# Patient Record
Sex: Female | Born: 1950 | Race: Black or African American | Hispanic: No | Marital: Married | Smoking: Never smoker
Health system: Southern US, Community
[De-identification: ages and names within clinical notes are randomized; demographics above are authoritative.]

## PROBLEM LIST (undated history)

## (undated) DIAGNOSIS — H269 Unspecified cataract: Secondary | ICD-10-CM

## (undated) DIAGNOSIS — I10 Essential (primary) hypertension: Secondary | ICD-10-CM

## (undated) HISTORY — DX: Unspecified cataract: H26.9

## (undated) HISTORY — PX: CHOLECYSTECTOMY: SHX55

---

## 1977-04-11 HISTORY — PX: TUBAL LIGATION: SHX77

## 1980-04-11 HISTORY — PX: ABDOMINAL HYSTERECTOMY: SHX81

## 2002-06-24 ENCOUNTER — Emergency Department (HOSPITAL_COMMUNITY): Admission: EM | Admit: 2002-06-24 | Discharge: 2002-06-24 | Payer: Self-pay | Admitting: Emergency Medicine

## 2002-10-29 ENCOUNTER — Ambulatory Visit (HOSPITAL_COMMUNITY): Admission: RE | Admit: 2002-10-29 | Discharge: 2002-10-29 | Payer: Self-pay | Admitting: Family Medicine

## 2004-12-15 ENCOUNTER — Emergency Department (HOSPITAL_COMMUNITY): Admission: EM | Admit: 2004-12-15 | Discharge: 2004-12-15 | Payer: Self-pay | Admitting: Emergency Medicine

## 2004-12-28 ENCOUNTER — Ambulatory Visit: Payer: Self-pay | Admitting: Family Medicine

## 2004-12-31 ENCOUNTER — Ambulatory Visit (HOSPITAL_COMMUNITY): Admission: RE | Admit: 2004-12-31 | Discharge: 2004-12-31 | Payer: Self-pay | Admitting: Family Medicine

## 2005-02-09 ENCOUNTER — Ambulatory Visit: Payer: Self-pay | Admitting: Family Medicine

## 2005-02-22 ENCOUNTER — Ambulatory Visit (HOSPITAL_COMMUNITY): Admission: RE | Admit: 2005-02-22 | Discharge: 2005-02-22 | Payer: Self-pay | Admitting: General Surgery

## 2008-03-05 ENCOUNTER — Ambulatory Visit (HOSPITAL_COMMUNITY): Admission: RE | Admit: 2008-03-05 | Discharge: 2008-03-05 | Payer: Self-pay | Admitting: Family Medicine

## 2009-02-12 ENCOUNTER — Encounter: Payer: Self-pay | Admitting: Family Medicine

## 2009-04-14 ENCOUNTER — Emergency Department (HOSPITAL_COMMUNITY): Admission: EM | Admit: 2009-04-14 | Discharge: 2009-04-14 | Payer: Self-pay | Admitting: Emergency Medicine

## 2009-09-28 ENCOUNTER — Ambulatory Visit (HOSPITAL_COMMUNITY): Admission: RE | Admit: 2009-09-28 | Discharge: 2009-09-28 | Payer: Self-pay | Admitting: Family Medicine

## 2010-02-13 ENCOUNTER — Emergency Department (HOSPITAL_COMMUNITY): Admission: EM | Admit: 2010-02-13 | Discharge: 2010-02-14 | Payer: Self-pay | Admitting: Emergency Medicine

## 2010-05-02 ENCOUNTER — Encounter: Payer: Self-pay | Admitting: Family Medicine

## 2010-06-22 LAB — COMPREHENSIVE METABOLIC PANEL
ALT: 56 U/L — ABNORMAL HIGH (ref 0–35)
AST: 65 U/L — ABNORMAL HIGH (ref 0–37)
Albumin: 3.4 g/dL — ABNORMAL LOW (ref 3.5–5.2)
Alkaline Phosphatase: 77 U/L (ref 39–117)
BUN: 12 mg/dL (ref 6–23)
CO2: 21 mEq/L (ref 19–32)
Calcium: 8.7 mg/dL (ref 8.4–10.5)
Chloride: 109 mEq/L (ref 96–112)
Creatinine, Ser: 0.74 mg/dL (ref 0.4–1.2)
GFR calc Af Amer: >60 mL/min (ref >60)
GFR calc non Af Amer: >60 mL/min (ref >60)
Glucose, Bld: 115 mg/dL — ABNORMAL HIGH (ref 70–99)
Potassium: 3.6 mEq/L (ref 3.5–5.1)
Sodium: 136 mEq/L (ref 135–145)
Total Bilirubin: 0.7 mg/dL (ref 0.3–1.2)
Total Protein: 7.2 g/dL (ref 6.0–8.3)

## 2010-06-22 LAB — URINE MICROSCOPIC-ADD ON

## 2010-06-22 LAB — CULTURE, BLOOD (ROUTINE X 2)
Culture: NO GROWTH
Culture: NO GROWTH

## 2010-06-22 LAB — CBC
HCT: 33.5 % — ABNORMAL LOW (ref 36.0–46.0)
Hemoglobin: 11.3 g/dL — ABNORMAL LOW (ref 12.0–15.0)
MCH: 26.7 pg (ref 26.0–34.0)
MCHC: 33.8 g/dL (ref 30.0–36.0)
MCV: 79.1 fL (ref 78.0–100.0)
Platelets: 279 10*3/uL (ref 150–400)
RBC: 4.24 MIL/uL (ref 3.87–5.11)
RDW: 12.9 % (ref 11.5–15.5)
WBC: 13.7 10*3/uL — ABNORMAL HIGH (ref 4.0–10.5)

## 2010-06-22 LAB — URINALYSIS, ROUTINE W REFLEX MICROSCOPIC
Bilirubin Urine: NEGATIVE
Glucose, UA: NEGATIVE mg/dL
Ketones, ur: NEGATIVE mg/dL
Nitrite: POSITIVE — AB
Protein, ur: 100 mg/dL — AB
Specific Gravity, Urine: 1.025 (ref 1.005–1.030)
Urobilinogen, UA: 0.2 mg/dL (ref 0.0–1.0)
pH: 8.5 — ABNORMAL HIGH (ref 5.0–8.0)

## 2010-06-22 LAB — LACTIC ACID, PLASMA: Lactic Acid, Venous: 2.3 mmol/L — ABNORMAL HIGH (ref 0.5–2.2)

## 2011-07-15 ENCOUNTER — Emergency Department (HOSPITAL_COMMUNITY): Payer: Self-pay

## 2011-07-15 ENCOUNTER — Emergency Department (HOSPITAL_COMMUNITY)
Admission: EM | Admit: 2011-07-15 | Discharge: 2011-07-16 | Disposition: A | Discharge status: Home / Self Care | Payer: Self-pay | Attending: Emergency Medicine | Admitting: Emergency Medicine

## 2011-07-15 ENCOUNTER — Encounter (HOSPITAL_COMMUNITY): Payer: Self-pay

## 2011-07-15 DIAGNOSIS — R05 Cough: Secondary | ICD-10-CM | POA: Insufficient documentation

## 2011-07-15 DIAGNOSIS — R062 Wheezing: Secondary | ICD-10-CM | POA: Insufficient documentation

## 2011-07-15 DIAGNOSIS — J209 Acute bronchitis, unspecified: Secondary | ICD-10-CM | POA: Insufficient documentation

## 2011-07-15 DIAGNOSIS — R509 Fever, unspecified: Secondary | ICD-10-CM | POA: Insufficient documentation

## 2011-07-15 DIAGNOSIS — R059 Cough, unspecified: Secondary | ICD-10-CM | POA: Insufficient documentation

## 2011-07-15 DIAGNOSIS — I10 Essential (primary) hypertension: Secondary | ICD-10-CM | POA: Insufficient documentation

## 2011-07-15 HISTORY — DX: Essential (primary) hypertension: I10

## 2011-07-15 MED ORDER — HYDROCOD POLST-CHLORPHEN POLST 10-8 MG/5ML PO LQCR
5.0000 mL | Freq: Once | ORAL | Status: AC
  Administered 2011-07-16: 5 mL via ORAL
  Filled 2011-07-15: qty 5

## 2011-07-15 MED ORDER — ALBUTEROL SULFATE (5 MG/ML) 0.5% IN NEBU
5.0000 mg | INHALATION_SOLUTION | Freq: Once | RESPIRATORY_TRACT | Status: AC
  Administered 2011-07-16: 5 mg via RESPIRATORY_TRACT
  Filled 2011-07-15: qty 1

## 2011-07-15 MED ORDER — IPRATROPIUM BROMIDE 0.02 % IN SOLN
0.5000 mg | Freq: Once | RESPIRATORY_TRACT | Status: AC
  Administered 2011-07-16: 0.5 mg via RESPIRATORY_TRACT
  Filled 2011-07-15: qty 2.5

## 2011-07-15 NOTE — ED Notes (Signed)
Pt presents with cough and chest congestion x 1 week.  °

## 2011-07-15 NOTE — ED Notes (Signed)
Report received from Julie,RN

## 2011-07-16 MED ORDER — AZITHROMYCIN 250 MG PO TABS
500.0000 mg | ORAL_TABLET | Freq: Once | ORAL | Status: AC
  Administered 2011-07-16: 500 mg via ORAL
  Filled 2011-07-16: qty 2

## 2011-07-16 MED ORDER — PROMETHAZINE-CODEINE 6.25-10 MG/5ML PO SYRP: 5.0000 mL | ORAL_SOLUTION | ORAL | Status: AC | PRN

## 2011-07-16 MED ORDER — BENZONATATE 100 MG PO CAPS: 100.0000 mg | ORAL_CAPSULE | Freq: Three times a day (TID) | ORAL | Status: AC | PRN

## 2011-07-16 MED ORDER — AZITHROMYCIN 250 MG PO TABS: ORAL_TABLET | ORAL | Status: AC

## 2011-07-16 MED ORDER — ALBUTEROL SULFATE HFA 108 (90 BASE) MCG/ACT IN AERS
2.0000 | INHALATION_SPRAY | Freq: Once | RESPIRATORY_TRACT | Status: AC
  Administered 2011-07-16: 2 via RESPIRATORY_TRACT
  Filled 2011-07-16: qty 6.7

## 2011-07-16 NOTE — Discharge Instructions (Signed)

## 2011-07-16 NOTE — ED Provider Notes (Signed)
History     CSN: [id number]  Arrival date & time 07/15/11  2132   First MD Initiated Contact with Patient 07/15/11 2307      Chief Complaint  Patient presents with  . Cough    (Consider location/radiation/quality/duration/timing/severity/associated sxs/prior treatment) Patient is a 61 y.o. female presenting with cough. The history is provided by the patient and the spouse.  Cough This is a new problem. Episode onset: 1 week. The problem occurs every few minutes. The problem has not changed since onset.The cough is productive of sputum. The maximum temperature recorded prior to her arrival was 101 to 101.9 F. The fever has been present for 3 to 4 days. Associated symptoms include chills, sweats, sore throat, myalgias and wheezing. Pertinent negatives include no chest pain, no ear congestion, no ear pain, no headaches and no shortness of breath. Associated symptoms comments: Fatigue as cough has been keeping her awake at night. . Treatments tried: She has been using her pulmicort without relief.  Has also used otc cough syrup with no relief. The treatment provided no relief. She is not a smoker. Her past medical history is significant for bronchitis. Past medical history comments: Was recently diagnosed with "possible asthma".  Has been on a steroid inhaler for several months..    Past Medical History  Diagnosis Date  . Hypertension     Past Surgical History  Procedure Date  . Abdominal hysterectomy     No family history on file.  History  Substance Use Topics  . Smoking status: Never Smoker   . Smokeless tobacco: Not on file  . Alcohol Use: No    OB History    Grav Para Term Preterm Abortions TAB SAB Ect Mult Living                  Review of Systems  Constitutional: Positive for fever, chills and fatigue.  HENT: Positive for congestion and sore throat. Negative for ear pain and neck pain.   Eyes: Negative.   Respiratory: Positive for cough and wheezing. Negative for  chest tightness and shortness of breath.   Cardiovascular: Negative for chest pain.  Gastrointestinal: Negative for nausea and abdominal pain.  Genitourinary: Negative.   Musculoskeletal: Positive for myalgias. Negative for joint swelling and arthralgias.  Skin: Negative.  Negative for rash and wound.  Neurological: Negative for dizziness, weakness, light-headedness, numbness and headaches.  Hematological: Negative.   Psychiatric/Behavioral: Negative.     Allergies  Review of patient's allergies indicates no known allergies.  Home Medications   Current Outpatient Rx  Name Route Sig Dispense Refill  . BUDESONIDE 180 MCG/ACT IN AEPB Inhalation Inhale 1 puff into the lungs 2 (two) times daily.    Marland Kitchen NAPROXEN 500 MG PO TABS Oral Take 500 mg by mouth as needed. For pain    . VERAPAMIL HCL ER 120 MG PO TBCR Oral Take 120 mg by mouth 2 (two) times daily.    . AZITHROMYCIN 250 MG PO TABS  1 tablet by mouth daily for 4 days 4 each 0  . BENZONATATE 100 MG PO CAPS Oral Take 1 capsule (100 mg total) by mouth 3 (three) times daily as needed for cough. 14 capsule 0  . BUDESONIDE-FORMOTEROL FUMARATE 160-4.5 MCG/ACT IN AERO Inhalation Inhale 2 puffs into the lungs 2 (two) times daily.    Marland Kitchen PROMETHAZINE-CODEINE 6.25-10 MG/5ML PO SYRP Oral Take 5 mLs by mouth every 4 (four) hours as needed for cough. 120 mL 0    BP 129/68  Pulse 89  Temp(Src) 98.8 F (37.1 C) (Oral)  Resp 18  Ht 5\' 6"  (1.676 m)  Wt 200 lb (90.719 kg)  BMI 32.28 kg/m2  SpO2 99%  Physical Exam  Nursing note and vitals reviewed. Constitutional: She is oriented to person, place, and time. She appears well-developed and well-nourished.  HENT:  Head: Normocephalic and atraumatic.  Right Ear: Tympanic membrane normal.  Left Ear: Tympanic membrane normal.  Nose: No mucosal edema or rhinorrhea.  Mouth/Throat: Uvula is midline, oropharynx is clear and moist and mucous membranes are normal.  Eyes: Conjunctivae are normal.  Neck:  Normal range of motion.  Cardiovascular: Normal rate, regular rhythm, normal heart sounds and intact distal pulses.   Pulmonary/Chest: Effort normal. No respiratory distress. She has wheezes. She has no rales.  Abdominal: Soft. Bowel sounds are normal. There is no tenderness.  Musculoskeletal: Normal range of motion.  Lymphadenopathy:    She has no cervical adenopathy.  Neurological: She is alert and oriented to person, place, and time.  Skin: Skin is warm and dry.  Psychiatric: She has a normal mood and affect.    ED Course  Procedures (including critical care time)  Labs Reviewed - No data to display Dg Chest 2 View  07/16/2011  *RADIOLOGY REPORT*  Clinical Data: Cough.  Congestion.  Hypertension.  CHEST - 2 VIEW  Comparison: 02/13/2010  Findings: Mild tortuosity of the thoracic aorta is observed.  Heart size is within normal limits.  Minimal thoracic spondylosis noted.  No pleural effusion is identified.  The lungs appear clear.  IMPRESSION:  1.  Mildly tortuous thoracic aorta.  Minimal thoracic spondylosis. Otherwise, no significant abnormality identified.  Original Report Authenticated By: Dellia Cloud, M.D.     1. Bronchitis with bronchospasm    Patient was given albuterol and Atrovent neb treatment along with a dose of Tussionex and she obtained significant improvement in the frequency of cough.  She was no longer wheezing at the time of discharge.  She was given a dose of Zithromax prior to discharge home, also given an albuterol MDI with instructions to use 2 puffs every 4 hours if needed for cough or wheezing.   MDM  Patient was prescribed 4 more tablets of Zithromax and Tessalon Perles, Phenergan With Codeine for cough relief.  Encouraged to follow up with PCP if not improving over the next several days.  Cautioned to not drive within 4 hours of using the cough syrup as this will make her drowsy.        Candis Musa, PA 07/16/11 1751

## 2011-07-16 NOTE — ED Provider Notes (Signed)
Medical screening examination/treatment/procedure(s) were performed by non-physician practitioner and as supervising physician I was immediately available for consultation/collaboration.   Mikaiya Tramble, MD 07/16/11 2338 

## 2012-04-11 DIAGNOSIS — I10 Essential (primary) hypertension: Secondary | ICD-10-CM

## 2012-04-11 HISTORY — DX: Essential (primary) hypertension: I10

## 2012-04-27 ENCOUNTER — Ambulatory Visit (HOSPITAL_COMMUNITY): Payer: Self-pay | Admitting: Specialist

## 2012-06-08 ENCOUNTER — Other Ambulatory Visit (HOSPITAL_COMMUNITY): Payer: Self-pay | Admitting: Family Medicine

## 2012-06-08 DIAGNOSIS — M542 Cervicalgia: Secondary | ICD-10-CM

## 2012-06-11 ENCOUNTER — Ambulatory Visit (HOSPITAL_COMMUNITY)
Admission: RE | Admit: 2012-06-11 | Discharge: 2012-06-11 | Disposition: A | Discharge status: Home / Self Care | Payer: BC Managed Care – PPO | Source: Ambulatory Visit | Attending: Family Medicine | Admitting: Family Medicine

## 2012-06-11 DIAGNOSIS — M5126 Other intervertebral disc displacement, lumbar region: Secondary | ICD-10-CM | POA: Insufficient documentation

## 2012-06-11 DIAGNOSIS — M542 Cervicalgia: Secondary | ICD-10-CM

## 2013-07-11 ENCOUNTER — Encounter (HOSPITAL_COMMUNITY): Payer: Self-pay | Admitting: Emergency Medicine

## 2013-07-11 ENCOUNTER — Emergency Department (HOSPITAL_COMMUNITY): Payer: BC Managed Care – PPO

## 2013-07-11 ENCOUNTER — Emergency Department (HOSPITAL_COMMUNITY)
Admission: EM | Admit: 2013-07-11 | Discharge: 2013-07-11 | Disposition: A | Discharge status: Home / Self Care | Payer: BC Managed Care – PPO | Attending: Emergency Medicine | Admitting: Emergency Medicine

## 2013-07-11 DIAGNOSIS — J329 Chronic sinusitis, unspecified: Secondary | ICD-10-CM | POA: Insufficient documentation

## 2013-07-11 DIAGNOSIS — I10 Essential (primary) hypertension: Secondary | ICD-10-CM | POA: Insufficient documentation

## 2013-07-11 DIAGNOSIS — R519 Headache, unspecified: Secondary | ICD-10-CM

## 2013-07-11 DIAGNOSIS — R51 Headache: Secondary | ICD-10-CM | POA: Insufficient documentation

## 2013-07-11 DIAGNOSIS — Z79899 Other long term (current) drug therapy: Secondary | ICD-10-CM | POA: Insufficient documentation

## 2013-07-11 DIAGNOSIS — R110 Nausea: Secondary | ICD-10-CM | POA: Insufficient documentation

## 2013-07-11 DIAGNOSIS — R5383 Other fatigue: Secondary | ICD-10-CM

## 2013-07-11 DIAGNOSIS — R5381 Other malaise: Secondary | ICD-10-CM | POA: Insufficient documentation

## 2013-07-11 DIAGNOSIS — IMO0002 Reserved for concepts with insufficient information to code with codable children: Secondary | ICD-10-CM | POA: Insufficient documentation

## 2013-07-11 LAB — COMPREHENSIVE METABOLIC PANEL
ALK PHOS: 73 U/L (ref 39–117)
ALT: 45 U/L — ABNORMAL HIGH (ref 0–35)
AST: 28 U/L (ref 0–37)
Albumin: 3.4 g/dL — ABNORMAL LOW (ref 3.5–5.2)
BUN: 18 mg/dL (ref 6–23)
CO2: 29 mEq/L (ref 19–32)
Calcium: 8.6 mg/dL (ref 8.4–10.5)
Chloride: 102 mEq/L (ref 96–112)
Creatinine, Ser: 0.62 mg/dL (ref 0.50–1.10)
GFR calc Af Amer: >90 mL/min (ref >90)
GFR calc non Af Amer: >90 mL/min (ref >90)
Glucose, Bld: 103 mg/dL — ABNORMAL HIGH (ref 70–99)
POTASSIUM: 3.3 meq/L — AB (ref 3.7–5.3)
SODIUM: 142 meq/L (ref 137–147)
Total Bilirubin: 0.5 mg/dL (ref 0.3–1.2)
Total Protein: 7.4 g/dL (ref 6.0–8.3)

## 2013-07-11 LAB — CBC WITH DIFFERENTIAL/PLATELET
BASOS PCT: 0 % (ref 0–1)
Basophils Absolute: 0 10*3/uL (ref 0.0–0.1)
EOS ABS: 0.1 10*3/uL (ref 0.0–0.7)
Eosinophils Relative: 1 % (ref 0–5)
HCT: 36.2 % (ref 36.0–46.0)
Hemoglobin: 12.4 g/dL (ref 12.0–15.0)
Lymphocytes Relative: 31 % (ref 12–46)
Lymphs Abs: 3.8 10*3/uL (ref 0.7–4.0)
MCH: 27 pg (ref 26.0–34.0)
MCHC: 34.3 g/dL (ref 30.0–36.0)
MCV: 78.9 fL (ref 78.0–100.0)
MONOS PCT: 12 % (ref 3–12)
Monocytes Absolute: 1.5 10*3/uL — ABNORMAL HIGH (ref 0.1–1.0)
NEUTROS PCT: 56 % (ref 43–77)
Neutro Abs: 7 10*3/uL (ref 1.7–7.7)
PLATELETS: 320 10*3/uL (ref 150–400)
RBC: 4.59 MIL/uL (ref 3.87–5.11)
RDW: 13.1 % (ref 11.5–15.5)
WBC: 12.3 10*3/uL — ABNORMAL HIGH (ref 4.0–10.5)

## 2013-07-11 MED ORDER — HYDROCODONE-ACETAMINOPHEN 5-325 MG PO TABS: 1.0000 | ORAL_TABLET | Freq: Four times a day (QID) | ORAL | Status: DC | PRN

## 2013-07-11 MED ORDER — AMOXICILLIN 500 MG PO CAPS: 500.0000 mg | ORAL_CAPSULE | Freq: Three times a day (TID) | ORAL | Status: DC

## 2013-07-11 MED ORDER — KETOROLAC TROMETHAMINE 30 MG/ML IJ SOLN
30.0000 mg | Freq: Once | INTRAMUSCULAR | Status: AC
  Administered 2013-07-11: 30 mg via INTRAVENOUS
  Filled 2013-07-11: qty 1

## 2013-07-11 MED ORDER — METOCLOPRAMIDE HCL 5 MG/ML IJ SOLN
10.0000 mg | Freq: Once | INTRAMUSCULAR | Status: AC
  Administered 2013-07-11: 10 mg via INTRAVENOUS
  Filled 2013-07-11: qty 2

## 2013-07-11 MED ORDER — DIPHENHYDRAMINE HCL 50 MG/ML IJ SOLN
25.0000 mg | Freq: Once | INTRAMUSCULAR | Status: AC
  Administered 2013-07-11: 25 mg via INTRAVENOUS
  Filled 2013-07-11: qty 1

## 2013-07-11 NOTE — ED Provider Notes (Signed)
CSN: [id number]     Arrival date & time 07/11/13  1835 History  This chart was scribed for Maudry Diego, MD by Elby Beck, ED Scribe. This patient was seen in room APA17/[facility]17 and the patient's care was started at 7:30 PM.   Chief Complaint  Patient presents with  . Headache    Patient is a 63 y.o. female presenting with headaches. The history is provided by the patient. No language interpreter was used.  Headache Pain location:  Generalized Radiates to:  Does not radiate Pain severity now: severe. Onset quality:  Gradual Duration:  2 days Timing:  Intermittent Progression:  Waxing and waning Chronicity:  New Similar to prior headaches: no (has never had a prior headache this severe)   Relieved by:  None tried Worsened by:  Nothing tried Ineffective treatments:  None tried Associated symptoms: fatigue and nausea   Associated symptoms: no abdominal pain, no back pain, no congestion, no cough, no diarrhea, no seizures, no sinus pressure and no vomiting     HPI Comments: Teresa Arellano is a 63 y.o. Female with a history of HTN who presents to the Emergency Department complaining of a severe generalized headache onset yesterday. She reports associated fatigue and nausea. She denies any prior history of headaches this severe. She reports that she tkes HTN medications as prescribed, and that she takes no other medications. She denies vomiting or any other symptoms   Past Medical History  Diagnosis Date  . Hypertension    Past Surgical History  Procedure Laterality Date  . Abdominal hysterectomy    . Cholecystectomy    . Tubal ligation     No family history on file. History  Substance Use Topics  . Smoking status: Never Smoker   . Smokeless tobacco: Not on file  . Alcohol Use: No   OB History   Grav Para Term Preterm Abortions TAB SAB Ect Mult Living                 Review of Systems  Constitutional: Positive for fatigue. Negative for appetite change.  HENT:  Negative for congestion, ear discharge and sinus pressure.   Eyes: Negative for discharge.  Respiratory: Negative for cough.   Cardiovascular: Negative for chest pain.  Gastrointestinal: Positive for nausea. Negative for vomiting, abdominal pain and diarrhea.  Genitourinary: Negative for frequency and hematuria.  Musculoskeletal: Negative for back pain.  Skin: Negative for rash.  Neurological: Positive for headaches. Negative for seizures.  Psychiatric/Behavioral: Negative for hallucinations.    Allergies  Review of patient's allergies indicates no known allergies.  Home Medications   Current Outpatient Rx  Name  Route  Sig  Dispense  Refill  . mometasone-formoterol (DULERA) 100-5 MCG/ACT AERO   Inhalation   Inhale 2 puffs into the lungs 2 (two) times daily.         . verapamil (CALAN-SR) 120 MG CR tablet   Oral   Take 120 mg by mouth 2 (two) times daily.          Triage Vitals: BP 151/79  Pulse 84  Temp(Src) 99 F (37.2 C) (Oral)  Resp 22  Ht 5\' 7"  (1.702 m)  Wt 198 lb (89.812 kg)  BMI 31.00 kg/m2  SpO2 100%  Physical Exam  Constitutional: She is oriented to person, place, and time. She appears well-developed.  HENT:  Head: Normocephalic.  Eyes: Conjunctivae and EOM are normal. No scleral icterus.  Neck: Neck supple. No thyromegaly present.  Cardiovascular: Normal rate and  regular rhythm.  Exam reveals no gallop and no friction rub.   No murmur heard. Pulmonary/Chest: No stridor. She has no wheezes. She has no rales. She exhibits no tenderness.  Abdominal: She exhibits no distension. There is no tenderness. There is no rebound.  Musculoskeletal: Normal range of motion. She exhibits no edema.  Lymphadenopathy:    She has no cervical adenopathy.  Neurological: She is oriented to person, place, and time. She exhibits normal muscle tone. Coordination normal.  Skin: No rash noted. No erythema.  Psychiatric: She has a normal mood and affect. Her behavior is normal.     ED Course  Procedures (including critical care time)  DIAGNOSTIC STUDIES: Oxygen Saturation is 100% on RA, normal by my interpretation.    COORDINATION OF CARE: 7:33 PM- Discussed plan to obtain diagnostic lab work and radiology. Will also order Toradol, Benadryl and Reglan. Pt advised of plan for treatment and pt agrees.  Medications  ketorolac (TORADOL) 30 MG/ML injection 30 mg (not administered)  diphenhydrAMINE (BENADRYL) injection 25 mg (not administered)  metoCLOPramide (REGLAN) injection 10 mg (not administered)   Labs Review Labs Reviewed  CBC WITH DIFFERENTIAL  COMPREHENSIVE METABOLIC PANEL   Imaging Review No results found.   EKG Interpretation None      MDM   Final diagnoses:  None    The chart was scribed for me under my direct supervision.  I personally performed the history, physical, and medical decision making and all procedures in the evaluation of this patient.Maudry Diego, MD 07/11/13 2119

## 2013-07-11 NOTE — Discharge Instructions (Signed)
Follow up with your md next week for recheck °

## 2013-07-11 NOTE — ED Notes (Signed)
Patient states her headache is continuing to ease off.

## 2013-07-11 NOTE — ED Notes (Signed)
Pt c/o ha and fatigue x 2 days.

## 2013-09-10 ENCOUNTER — Encounter: Payer: Self-pay | Admitting: Family Medicine

## 2013-09-10 ENCOUNTER — Ambulatory Visit (INDEPENDENT_AMBULATORY_CARE_PROVIDER_SITE_OTHER): Payer: BC Managed Care – PPO | Admitting: Family Medicine

## 2013-09-10 ENCOUNTER — Encounter (INDEPENDENT_AMBULATORY_CARE_PROVIDER_SITE_OTHER): Payer: Self-pay

## 2013-09-10 VITALS — BP 140/86 | HR 88 | Resp 16 | Ht 66.0 in | Wt 208.4 lb

## 2013-09-10 DIAGNOSIS — Z23 Encounter for immunization: Secondary | ICD-10-CM

## 2013-09-10 DIAGNOSIS — E785 Hyperlipidemia, unspecified: Secondary | ICD-10-CM

## 2013-09-10 DIAGNOSIS — J45909 Unspecified asthma, uncomplicated: Secondary | ICD-10-CM

## 2013-09-10 DIAGNOSIS — Z1231 Encounter for screening mammogram for malignant neoplasm of breast: Secondary | ICD-10-CM

## 2013-09-10 DIAGNOSIS — E66811 Obesity, class 1: Secondary | ICD-10-CM

## 2013-09-10 DIAGNOSIS — E669 Obesity, unspecified: Secondary | ICD-10-CM

## 2013-09-10 DIAGNOSIS — I10 Essential (primary) hypertension: Secondary | ICD-10-CM

## 2013-09-10 LAB — CBC
HEMATOCRIT: 36.9 % (ref 36.0–46.0)
Hemoglobin: 12.5 g/dL (ref 12.0–15.0)
MCH: 26.5 pg (ref 26.0–34.0)
MCHC: 33.9 g/dL (ref 30.0–36.0)
MCV: 78.3 fL (ref 78.0–100.0)
PLATELETS: 288 10*3/uL (ref 150–400)
RBC: 4.71 MIL/uL (ref 3.87–5.11)
RDW: 14 % (ref 11.5–15.5)
WBC: 6.2 10*3/uL (ref 4.0–10.5)

## 2013-09-10 NOTE — Patient Instructions (Signed)
F/u in for CPE in 2 .5 month, call if you need me before  EKG in office today  You need a cXR , you may get this today at the hospital, and you are referred fro a mammogram  Labs today CBC, chem 7, lipid, tSH   You need to take the medication for blood pressure as it is high  Please start aspirin 81 mg once daily to reduce your risk of stroke  Yoou do need he shingles vaccine and also TdAP , they are both available and I encourage you to start with at least one today

## 2013-09-10 NOTE — Progress Notes (Signed)
   Subjective:    Patient ID: Teresa Arellano, female    DOB: 1950-06-06, 63 y.o.   MRN: [id number]  HPI  New patient visit for pleasant female with medical history only significant for hypertension. She has recently discontinued her medication , attempting to manage her BP "on her own"  She denies any symptoms of CHF or headache. 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 Denies sinus pressure, nasal congestion, ear pain or sore throat. Denies chest congestion, productive cough or wheezing. Denies chest pains, palpitation, PND , orthopnea  and leg swelling Denies abdominal pain, nausea, vomiting,diarrhea or constipation.   Deni significant joint pain, swelling and limitation in mobility. Denies headaches, seizures, numbness, or tingling. Denies depression, anxiety or insomnia. Denies skin break down or rash.        Objective:   Physical Exam BP 140/86  Pulse 88  Resp 16  Ht 5\' 6"  (1.676 m)  Wt 208 lb 6.4 oz (94.53 kg)  BMI 33.65 kg/m2  SpO2 100% Patient alert and oriented and in no cardiopulmonary distress.  HEENT: No facial asymmetry, EOMI,   oropharynx pink and moist.  Neck supple no JVD, no mass.  Chest: Clear to auscultation bilaterally.  CVS: S1, S2 no murmurs, no S3. EKG; sinus rhythm, no ischemic changes , no LVH ABD: Soft non tender.   Ext: No edema  MS: Adequate ROM spine, shoulders, hips and knees.  Skin: Intact, no ulcerations or rash noted.  Psych: Good eye contact, normal affect. Memory intact not anxious or depressed appearing.  CNS: CN 2-12 intact, power,  normal  throughout.no focal deficits noted.         Assessment & Plan:  Essential hypertension, benign Uncontrolled , needs ot resume medication as before DASH diet and commitment to daily physical activity for a minimum of 30 minutes discussed and encouraged, as a part of hypertension management. The importance of attaining a healthy weight is also discussed. Baseline EKG; sinus rhythm, no ischemia, No LVH   Dyslipidemia Elevated LDL Hyperlipidemia:Low fat diet discussed and encouraged.  No meds indicated currently  Obesity (BMI 30.0-34.9)  Patient -educated about  the importance of commitment to a  minimum of 150 minutes of exercise per week. The importance of healthy food choices with portion control discussed. Encouraged to start a food diary, count calories and to consider  joining a support group. Sample diet sheets offered. Goals set by the patient for the next several months.     Unspecified asthma(493.90) No need for preventive medication. Unclear as to whethewr she really has asthm  Or just had a bout of acute bronchitis, will need to look at this further

## 2013-09-11 LAB — BASIC METABOLIC PANEL
BUN: 13 mg/dL (ref 6–23)
CO2: 24 mEq/L (ref 19–32)
Calcium: 9.5 mg/dL (ref 8.4–10.5)
Chloride: 106 mEq/L (ref 96–112)
Creat: 0.6 mg/dL (ref 0.50–1.10)
Glucose, Bld: 83 mg/dL (ref 70–99)
Potassium: 4.1 mEq/L (ref 3.5–5.3)
SODIUM: 140 meq/L (ref 135–145)

## 2013-09-11 LAB — LIPID PANEL
CHOL/HDL RATIO: 3.4 ratio
Cholesterol: 169 mg/dL (ref 0–200)
HDL: 50 mg/dL (ref >39)
LDL Cholesterol: 107 mg/dL — ABNORMAL HIGH (ref 0–99)
Triglycerides: 60 mg/dL (ref <150)
VLDL: 12 mg/dL (ref 0–40)

## 2013-09-11 LAB — TSH: TSH: 0.439 u[IU]/mL (ref 0.350–4.500)

## 2013-09-21 ENCOUNTER — Encounter: Payer: Self-pay | Admitting: Family Medicine

## 2013-09-21 DIAGNOSIS — E663 Overweight: Secondary | ICD-10-CM | POA: Insufficient documentation

## 2013-09-21 DIAGNOSIS — E669 Obesity, unspecified: Secondary | ICD-10-CM | POA: Insufficient documentation

## 2013-09-21 DIAGNOSIS — E785 Hyperlipidemia, unspecified: Secondary | ICD-10-CM | POA: Insufficient documentation

## 2013-09-21 NOTE — Assessment & Plan Note (Addendum)
Uncontrolled , needs ot resume medication as before DASH diet and commitment to daily physical activity for a minimum of 30 minutes discussed and encouraged, as a part of hypertension management. The importance of attaining a healthy weight is also discussed. Baseline EKG; sinus rhythm, no ischemia, No LVH

## 2013-09-21 NOTE — Assessment & Plan Note (Signed)
No need for preventive medication. Unclear as to whethewr she really has asthm  Or just had a bout of acute bronchitis, will need to look at this further

## 2013-09-21 NOTE — Assessment & Plan Note (Signed)
Elevated LDL Hyperlipidemia:Low fat diet discussed and encouraged.  No meds indicated currently

## 2013-09-21 NOTE — Assessment & Plan Note (Signed)
.   Patient educated about  the importance of commitment to a  minimum of 150 minutes of exercise per week. The importance of healthy food choices with portion control discussed. Encouraged to start a food diary, count calories and to consider  joining a support group. Sample diet sheets offered. Goals set by the patient for the next several months.    

## 2013-09-23 ENCOUNTER — Ambulatory Visit (HOSPITAL_COMMUNITY)
Admission: RE | Admit: 2013-09-23 | Discharge: 2013-09-23 | Disposition: A | Discharge status: Home / Self Care | Payer: BC Managed Care – PPO | Source: Ambulatory Visit | Attending: Family Medicine | Admitting: Family Medicine

## 2013-09-23 DIAGNOSIS — Z1231 Encounter for screening mammogram for malignant neoplasm of breast: Secondary | ICD-10-CM | POA: Insufficient documentation

## 2013-09-25 ENCOUNTER — Encounter: Payer: Self-pay | Admitting: Family Medicine

## 2014-01-09 ENCOUNTER — Ambulatory Visit (INDEPENDENT_AMBULATORY_CARE_PROVIDER_SITE_OTHER): Payer: BC Managed Care – PPO | Admitting: Family Medicine

## 2014-01-09 ENCOUNTER — Encounter (INDEPENDENT_AMBULATORY_CARE_PROVIDER_SITE_OTHER): Payer: Self-pay

## 2014-01-09 ENCOUNTER — Other Ambulatory Visit (HOSPITAL_COMMUNITY)
Admission: RE | Admit: 2014-01-09 | Discharge: 2014-01-09 | Disposition: A | Discharge status: Home / Self Care | Payer: BC Managed Care – PPO | Source: Ambulatory Visit | Attending: Family Medicine | Admitting: Family Medicine

## 2014-01-09 ENCOUNTER — Encounter: Payer: Self-pay | Admitting: Family Medicine

## 2014-01-09 VITALS — BP 140/98 | HR 98 | Resp 18 | Ht 66.0 in | Wt 211.0 lb

## 2014-01-09 DIAGNOSIS — I10 Essential (primary) hypertension: Secondary | ICD-10-CM

## 2014-01-09 DIAGNOSIS — Z01419 Encounter for gynecological examination (general) (routine) without abnormal findings: Secondary | ICD-10-CM | POA: Insufficient documentation

## 2014-01-09 DIAGNOSIS — Z23 Encounter for immunization: Secondary | ICD-10-CM

## 2014-01-09 DIAGNOSIS — Z1151 Encounter for screening for human papillomavirus (HPV): Secondary | ICD-10-CM | POA: Insufficient documentation

## 2014-01-09 DIAGNOSIS — Z0000 Encounter for general adult medical examination without abnormal findings: Secondary | ICD-10-CM

## 2014-01-09 DIAGNOSIS — Z124 Encounter for screening for malignant neoplasm of cervix: Secondary | ICD-10-CM

## 2014-01-09 DIAGNOSIS — Z1211 Encounter for screening for malignant neoplasm of colon: Secondary | ICD-10-CM

## 2014-01-09 LAB — POC HEMOCCULT BLD/STL (OFFICE/1-CARD/DIAGNOSTIC): Fecal Occult Blood, POC: NEGATIVE

## 2014-01-09 MED ORDER — TRIAMTERENE-HCTZ 37.5-25 MG PO TABS: 1.0000 | ORAL_TABLET | Freq: Every day | ORAL | Status: DC

## 2014-01-09 NOTE — Assessment & Plan Note (Signed)
Vaccine administered at visit.  

## 2014-01-09 NOTE — Patient Instructions (Signed)
F/u in  6 weeks, xcall if you need me before  NEW med for blood pressure, maxzide one every morning , sTOP calan once you start this, blood pressure is too high  Follow low salt diet, reduce portion size and continue to wlk daily  You will get a 1500 cal die tsheet to follow   You are referred for screening colonoscopy  Chem 7  In 6 weeks   Flu vaccine today

## 2014-01-11 NOTE — Progress Notes (Signed)
   Subjective:    Patient ID: Teresa Arellano, female    DOB: Jan 10, 1951, 63 y.o.   MRN: [id number]  HPI Patient is in for annual physical exam. Blood pressure is elevated and med change is also made. Immunization and cancer screeening needs are addressed also. She has no expressed health concerns, other than the need to lose weight. Review of Systems See HPI     Objective:   Physical Exam  BP 140/98  Pulse 98  Resp 18  Ht 5\' 6"  (1.676 m)  Wt 211 lb (95.709 kg)  BMI 34.07 kg/m2  SpO2 94% Pleasant obese female, alert and oriented x 3, in no cardio-pulmonary distress. Afebrile. HEENT No facial trauma or asymetry. Sinuses non tender.  EOMI, PERTL, fundoscopic exam  no hemorhage or exudate.  External ears normal, tympanic membranes clear. Oropharynx moist, no exudate  Fairly , good dentition. Neck: supple, no adenopathy,JVD or thyromegaly.No bruits.  Chest: Clear to ascultation bilaterally.No crackles or wheezes. Non tender to palpation  Breast: No asymetry,no masses or lumps. No tenderness. No nipple discharge or inversion. No axillary or supraclavicular adenopathy  Cardiovascular system; Heart sounds normal,  S1 and  S2 ,no S3.  No murmur, or thrill. Apical beat not displaced Peripheral pulses normal.  Abdomen: Soft, obese non tender, no organomegaly or masses. No bruits. Bowel sounds normal. No guarding, tenderness or rebound.  Rectal:  Normal sphincter tone. No mass.No rectal masses.  Guaiac negative stool.  GU: External genitalia normal female genitalia , female distribution of hair. No lesions. Urethral meatus normal in size, no  Prolapse, no lesions visibly  Present. Bladder non tender. Vagina pink and moist , with no visible lesions ,physiologic  discharge present . Adequate pelvic support no  cystocele or rectocele noted  Uterus and cervix are absent, no adnexal masses, no  adnexal tenderness.   Musculoskeletal exam: Full ROM of spine, hips ,  shoulders and knees. No deformity ,swelling or crepitus noted. No muscle wasting or atrophy.   Neurologic: Cranial nerves 2 to 12 intact. Power, tone ,sensation and reflexes normal throughout. No disturbance in gait. No tremor.  Skin: Intact, no ulceration, erythema , scaling or rash noted. Pigmentation normal throughout  Psych; Normal mood and affect. Judgement and concentration normal       Assessment & Plan:  Need for prophylactic vaccination and inoculation against influenza Vaccine administered at visit.   Encounter for annual physical exam Annual exam as documented. Counseling done  re healthy lifestyle involving commitment to 150 minutes exercise per week, heart healthy diet, and attaining healthy weight.The importance of adequate sleep also discussed. Regular seat belt use and safe storage  of firearms , is also discussed.Also home safety. Changes in health habits are decided on by the patient with goals and time frames  set for achieving them. Immunization and cancer screening needs are specifically addressed at this visit.   Essential hypertension, benign Uncontrolled, change medication, re check BP in 6 tpo 8 weeks DASH diet and commitment to daily physical activity for a minimum of 30 minutes discussed and encouraged, as a part of hypertension management. The importance of attaining a healthy weight is also discussed.

## 2014-01-11 NOTE — Assessment & Plan Note (Signed)
Uncontrolled, change medication, re check BP in 6 tpo 8 weeks DASH diet and commitment to daily physical activity for a minimum of 30 minutes discussed and encouraged, as a part of hypertension management. The importance of attaining a healthy weight is also discussed.

## 2014-01-11 NOTE — Assessment & Plan Note (Signed)
Annual exam as documented. Counseling done  re healthy lifestyle involving commitment to 150 minutes exercise per week, heart healthy diet, and attaining healthy weight.The importance of adequate sleep also discussed. Regular seat belt use and safe storage  of firearms , is also discussed.Also home safety. Changes in health habits are decided on by the patient with goals and time frames  set for achieving them. Immunization and cancer screening needs are specifically addressed at this visit.

## 2014-01-13 LAB — CYTOLOGY - PAP

## 2014-02-17 ENCOUNTER — Encounter (INDEPENDENT_AMBULATORY_CARE_PROVIDER_SITE_OTHER): Payer: Self-pay

## 2014-02-17 ENCOUNTER — Encounter: Payer: Self-pay | Admitting: Family Medicine

## 2014-02-17 ENCOUNTER — Ambulatory Visit (INDEPENDENT_AMBULATORY_CARE_PROVIDER_SITE_OTHER): Payer: BC Managed Care – PPO | Admitting: Family Medicine

## 2014-02-17 VITALS — BP 126/78 | HR 90 | Resp 18 | Ht 66.0 in | Wt 205.0 lb

## 2014-02-17 DIAGNOSIS — I10 Essential (primary) hypertension: Secondary | ICD-10-CM

## 2014-02-17 DIAGNOSIS — E785 Hyperlipidemia, unspecified: Secondary | ICD-10-CM

## 2014-02-17 DIAGNOSIS — E66811 Obesity, class 1: Secondary | ICD-10-CM

## 2014-02-17 DIAGNOSIS — E669 Obesity, unspecified: Secondary | ICD-10-CM

## 2014-02-17 NOTE — Patient Instructions (Addendum)
F/u in 4.5 months, call if you need me before  CONGRATS on excelent lifestyle changes, weight loss goal of 8 pounds  in 4.5 months  Take asprin 81 mg daily to reduce risk of stroke  BP is excellent, no change in medication needed  Please schedule your colonoscopy, call about this  Fasting cmp in 4.5 montd and lipid

## 2014-02-17 NOTE — Progress Notes (Signed)
   Subjective:    Patient ID: Teresa Arellano, female    DOB: [birth year], 63 y.o.   MRN: [id number]  HPI Pt in for re evaluation of uncontrolled blood pressure and other chronic problems Tolerated new medication with no problems Is working on lifestyle change with excellent weight loss, states she feels better   Review of Systems See HPI Denies recent fever or chills. Denies sinus pressure, nasal congestion, ear pain or sore throat. Denies chest congestion, productive cough or wheezing. Denies chest pains, palpitations and leg swelling Denies abdominal pain, nausea, vomiting,diarrhea or constipation.   Denies dysuria, frequency, hesitancy or incontinence. Denies joint pain, swelling and limitation in mobility.        Objective:   Physical Exam BP 126/78 mmHg  Pulse 90  Resp 18  Ht 5\' 6"  (1.676 m)  Wt 205 lb (92.987 kg)  BMI 33.10 kg/m2  SpO2 99% Patient alert and oriented and in no cardiopulmonary distress.  HEENT: No facial asymmetry, EOMI,   oropharynx pink and moist.  Neck supple no JVD, no mass.  Chest: Clear to auscultation bilaterally.  CVS: S1, S2 no murmurs, no S3.Regular rate.  ABD: Soft non tender.   Ext: No edema   CNS: CN 2-12 intact, power,  normal throughout.no focal deficits noted.       Assessment & Plan:  Essential hypertension, benign Controlled, no change in medication DASH diet and commitment to daily physical activity for a minimum of 30 minutes discussed and encouraged, as a part of hypertension management. The importance of attaining a healthy weight is also discussed.   Dyslipidemia Hyperlipidemia:Low fat diet discussed and encouraged.  Updated lab needed at/ before next visit.   Obesity (BMI 30.0-34.9) Improved. Pt applauded on succesful weight loss through lifestyle change, and encouraged to continue same. Weight loss goal set for the next several months.

## 2014-02-24 NOTE — Assessment & Plan Note (Signed)
Improved. Pt applauded on succesful weight loss through lifestyle change, and encouraged to continue same. Weight loss goal set for the next several months.  

## 2014-02-24 NOTE — Assessment & Plan Note (Signed)
Controlled, no change in medication DASH diet and commitment to daily physical activity for a minimum of 30 minutes discussed and encouraged, as a part of hypertension management. The importance of attaining a healthy weight is also discussed.  

## 2014-02-24 NOTE — Assessment & Plan Note (Signed)
Hyperlipidemia:Low fat diet discussed and encouraged.  Updated lab needed at/ before next visit.  

## 2014-02-28 ENCOUNTER — Telehealth: Payer: Self-pay

## 2014-02-28 NOTE — Telephone Encounter (Signed)
Patient called to schedule a screening colonoscopy please call back

## 2014-03-10 NOTE — Telephone Encounter (Signed)
LMOM to call.

## 2014-03-17 ENCOUNTER — Telehealth: Payer: Self-pay

## 2014-03-17 NOTE — Telephone Encounter (Signed)
Patient was returning call to DS about scheduling procedure. Please call her back at [phone]

## 2014-03-25 NOTE — Telephone Encounter (Signed)
Appropriate.

## 2014-03-25 NOTE — Telephone Encounter (Signed)
Gastroenterology Pre-Procedure Review  Request Date: Requesting Physician: Moshe Cipro  PATIENT REVIEW QUESTIONS: The patient responded to the following health history questions as indicated:    1. Diabetes Melitis: NO 2. Joint replacements in the past 12 months: NO 3. Major health problems in the past 3 months: NO 4. Has an artificial valve or MVP: NO 5. Has a defibrillator: NO 6. Has been advised in past to take antibiotics in advance of a procedure like teeth cleaning: NO 7.Family History Colon Cancer: NO\ 8. Alcohol: NO      MEDICATIONS & ALLERGIES:    Patient reports the following regarding taking any blood thinners:   Plavix? NO Aspirin? YES Coumadin? NO  Patient confirms/reports the following medications:  Current Outpatient Prescriptions  Medication Sig Dispense Refill  . triamterene-hydrochlorothiazide (MAXZIDE-25) 37.5-25 MG per tablet Take 1 tablet by mouth daily. 30 tablet 3   No current facility-administered medications for this visit.    Patient confirms/reports the following allergies:  No Known Allergies  No orders of the defined types were placed in this encounter.    AUTHORIZATION INFORMATION Primary Insurance: [place],  [place] #: [id number],  Group #: [id number] Pre-Cert / Auth required:  Pre-Cert / Auth #:    SCHEDULE INFORMATION: Procedure has been scheduled as follows:  Date: , Time:   Location:   This Gastroenterology Pre-Precedure Review Form is being routed to the following provider(s):   Pt would like to have SLF

## 2014-03-26 ENCOUNTER — Other Ambulatory Visit: Payer: Self-pay

## 2014-03-26 DIAGNOSIS — Z1211 Encounter for screening for malignant neoplasm of colon: Secondary | ICD-10-CM

## 2014-03-26 MED ORDER — PEG-KCL-NACL-NASULF-NA ASC-C 100 G PO SOLR: 1.0000 | ORAL | Status: DC

## 2014-03-26 NOTE — Telephone Encounter (Signed)
Tried to call with no answer. LMOM

## 2014-03-26 NOTE — Telephone Encounter (Signed)
Pt called back and is set up for TCS on 04/21/14 @ 10:30. She will call us after January 1 because she will be getting a new insurance

## 2014-04-16 NOTE — Telephone Encounter (Signed)
Pt called this morning to give [place] her new insurance information. [employer] PH#[id number] GROUP # [id number]. I called to get a PA for the TCS. The PA is pending the ref# [id number]

## 2014-04-17 ENCOUNTER — Telehealth: Payer: Self-pay | Admitting: Gastroenterology

## 2014-04-17 NOTE — Telephone Encounter (Signed)
Pt called to cancel her procedure for 1/11 with SF. She will call back to reschedule in the Spring.

## 2014-04-17 NOTE — Telephone Encounter (Signed)
Teresa Arellano was made aware to cancel her.

## 2014-04-21 ENCOUNTER — Ambulatory Visit (HOSPITAL_COMMUNITY): Admission: RE | Admit: 2014-04-21 | Payer: 59 | Source: Ambulatory Visit | Admitting: Gastroenterology

## 2014-04-21 ENCOUNTER — Encounter (HOSPITAL_COMMUNITY): Admission: RE | Payer: Self-pay | Source: Ambulatory Visit

## 2014-04-21 SURGERY — COLONOSCOPY
Anesthesia: Moderate Sedation

## 2014-05-06 ENCOUNTER — Other Ambulatory Visit: Payer: Self-pay | Admitting: Family Medicine

## 2014-07-23 ENCOUNTER — Ambulatory Visit: Payer: 59 | Admitting: Family Medicine

## 2014-10-20 ENCOUNTER — Encounter: Payer: Self-pay | Admitting: Family Medicine

## 2014-10-20 ENCOUNTER — Ambulatory Visit (INDEPENDENT_AMBULATORY_CARE_PROVIDER_SITE_OTHER): Payer: 59 | Admitting: Family Medicine

## 2014-10-20 VITALS — BP 126/76 | HR 70 | Resp 18 | Ht 66.0 in | Wt 209.0 lb

## 2014-10-20 DIAGNOSIS — Z114 Encounter for screening for human immunodeficiency virus [HIV]: Secondary | ICD-10-CM

## 2014-10-20 DIAGNOSIS — Z1231 Encounter for screening mammogram for malignant neoplasm of breast: Secondary | ICD-10-CM

## 2014-10-20 DIAGNOSIS — I10 Essential (primary) hypertension: Secondary | ICD-10-CM | POA: Diagnosis not present

## 2014-10-20 DIAGNOSIS — E669 Obesity, unspecified: Secondary | ICD-10-CM

## 2014-10-20 DIAGNOSIS — M79672 Pain in left foot: Secondary | ICD-10-CM | POA: Diagnosis not present

## 2014-10-20 DIAGNOSIS — E785 Hyperlipidemia, unspecified: Secondary | ICD-10-CM | POA: Diagnosis not present

## 2014-10-20 DIAGNOSIS — E66811 Obesity, class 1: Secondary | ICD-10-CM

## 2014-10-20 DIAGNOSIS — Z23 Encounter for immunization: Secondary | ICD-10-CM

## 2014-10-20 DIAGNOSIS — R7302 Impaired glucose tolerance (oral): Secondary | ICD-10-CM

## 2014-10-20 DIAGNOSIS — J209 Acute bronchitis, unspecified: Secondary | ICD-10-CM

## 2014-10-20 DIAGNOSIS — J40 Bronchitis, not specified as acute or chronic: Secondary | ICD-10-CM

## 2014-10-20 MED ORDER — METHYLPREDNISOLONE ACETATE 80 MG/ML IJ SUSP
80.0000 mg | Freq: Once | INTRAMUSCULAR | Status: AC
  Administered 2014-10-20: 80 mg via INTRAMUSCULAR

## 2014-10-20 MED ORDER — MELOXICAM 15 MG PO TABS: 15.0000 mg | ORAL_TABLET | Freq: Every day | ORAL | Status: DC

## 2014-10-20 MED ORDER — KETOROLAC TROMETHAMINE 60 MG/2ML IM SOLN
60.0000 mg | Freq: Once | INTRAMUSCULAR | Status: AC
  Administered 2014-10-20: 60 mg via INTRAMUSCULAR

## 2014-10-20 MED ORDER — PREDNISONE 5 MG (21) PO TBPK: 5.0000 mg | ORAL_TABLET | ORAL | Status: DC

## 2014-10-20 NOTE — Assessment & Plan Note (Addendum)
Uncontrolled.Toradol and depo medrol administered IM in the office , to be followed by a short course of oral prednisone and NSAIDS.Pt will call back if not improved

## 2014-10-20 NOTE — Patient Instructions (Addendum)
F/u in 5.5 month, call if you need me before  Please schedule your mammogram  Pls call and reschedule your colonoscopy, as we discussed you DO need this test  Please reduce fried food and eat more vegetable, weight loss goal of 6 to 8 pounds  Injections today for heel pain, likely due to heel spur and plantar fascitis, losing weight will alos help  Medication is also prescribed  Use of a heel cup in the show may also help   Shingles vaccine today  HIV, lipid, cmp and EGFR, , HBa1C and CBC  Today please  Plantar Fasciitis Plantar fasciitis is a common condition that causes foot pain. It is soreness (inflammation) of the band of tough fibrous tissue on the bottom of the foot that runs from the heel bone (calcaneus) to the ball of the foot. The cause of this soreness may be from excessive standing, poor fitting shoes, running on hard surfaces, being overweight, having an abnormal walk, or overuse (this is common in runners) of the painful foot or feet. It is also common in aerobic exercise dancers and ballet dancers. SYMPTOMS  Most people with plantar fasciitis complain of:  Severe pain in the morning on the bottom of their foot especially when taking the first steps out of bed. This pain recedes after a few minutes of walking.  Severe pain is experienced also during walking following a long period of inactivity.  Pain is worse when walking barefoot or up stairs DIAGNOSIS   Your caregiver will diagnose this condition by examining and feeling your foot.  Special tests such as X-rays of your foot, are usually not needed. PREVENTION   Consult a sports medicine professional before beginning a new exercise program.  Walking programs offer a good workout. With walking there is a lower chance of overuse injuries common to runners. There is less impact and less jarring of the joints.  Begin all new exercise programs slowly. If problems or pain develop, decrease the amount of time or  distance until you are at a comfortable level.  Wear good shoes and replace them regularly.  Stretch your foot and the heel cords at the back of the ankle (Achilles tendon) both before and after exercise.  Run or exercise on even surfaces that are not hard. For example, asphalt is better than pavement.  Do not run barefoot on hard surfaces.  If using a treadmill, vary the incline.  Do not continue to workout if you have foot or joint problems. Seek professional help if they do not improve. HOME CARE INSTRUCTIONS   Avoid activities that cause you pain until you recover.  Use ice or cold packs on the problem or painful areas after working out.  Only take over-the-counter or prescription medicines for pain, discomfort, or fever as directed by your caregiver.  Soft shoe inserts or athletic shoes with air or gel sole cushions may be helpful.  If problems continue or become more severe, consult a sports medicine caregiver or your own health care provider. Cortisone is a potent anti-inflammatory medication that may be injected into the painful area. You can discuss this treatment with your caregiver. MAKE SURE YOU:   Understand these instructions.  Will watch your condition.  Will get help right away if you are not doing well or get worse. Document Released: 12/21/2000 Document Revised: 06/20/2011 Document Reviewed: 02/20/2008 [facility] Patient Information 2015 [place], [place]. This information is not intended to replace advice given to you by your health care provider. Make  sure you discuss any questions you have with your health care provider.

## 2014-10-20 NOTE — Assessment & Plan Note (Signed)
After obtaining informed consent, the vaccine is  administered by LPN.  

## 2014-10-21 LAB — COMPLETE METABOLIC PANEL WITH GFR
ALK PHOS: 60 U/L (ref 39–117)
ALT: 24 U/L (ref 0–35)
AST: 25 U/L (ref 0–37)
Albumin: 3.9 g/dL (ref 3.5–5.2)
BUN: 20 mg/dL (ref 6–23)
CO2: 28 mEq/L (ref 19–32)
Calcium: 9 mg/dL (ref 8.4–10.5)
Chloride: 105 mEq/L (ref 96–112)
Creat: 0.96 mg/dL (ref 0.50–1.10)
GFR, EST AFRICAN AMERICAN: 73 mL/min
GFR, EST NON AFRICAN AMERICAN: 63 mL/min
GLUCOSE: 91 mg/dL (ref 70–99)
Potassium: 3.7 mEq/L (ref 3.5–5.3)
Sodium: 141 mEq/L (ref 135–145)
Total Bilirubin: 0.9 mg/dL (ref 0.2–1.2)
Total Protein: 6.8 g/dL (ref 6.0–8.3)

## 2014-10-21 LAB — LIPID PANEL
CHOL/HDL RATIO: 3 ratio
Cholesterol: 158 mg/dL (ref 0–200)
HDL: 53 mg/dL (ref >=46)
LDL CALC: 88 mg/dL (ref 0–99)
TRIGLYCERIDES: 85 mg/dL (ref <150)
VLDL: 17 mg/dL (ref 0–40)

## 2014-10-21 LAB — CBC
HEMATOCRIT: 36.8 % (ref 36.0–46.0)
Hemoglobin: 12.3 g/dL (ref 12.0–15.0)
MCH: 26.7 pg (ref 26.0–34.0)
MCHC: 33.4 g/dL (ref 30.0–36.0)
MCV: 79.8 fL (ref 78.0–100.0)
MPV: 8.8 fL (ref 8.6–12.4)
PLATELETS: 257 10*3/uL (ref 150–400)
RBC: 4.61 MIL/uL (ref 3.87–5.11)
RDW: 14.2 % (ref 11.5–15.5)
WBC: 8 10*3/uL (ref 4.0–10.5)

## 2014-10-22 LAB — HIV ANTIBODY (ROUTINE TESTING W REFLEX): HIV: NONREACTIVE

## 2014-10-22 LAB — HEMOGLOBIN A1C
HEMOGLOBIN A1C: 5.5 % (ref <5.7)
MEAN PLASMA GLUCOSE: 111 mg/dL (ref <117)

## 2014-10-26 ENCOUNTER — Encounter: Payer: Self-pay | Admitting: Family Medicine

## 2014-10-26 DIAGNOSIS — J209 Acute bronchitis, unspecified: Secondary | ICD-10-CM | POA: Insufficient documentation

## 2014-10-26 NOTE — Assessment & Plan Note (Signed)
sinngle episode requiring ED eval in 07/2011, no established diagnosis of  Asthma, has had no recurrence

## 2014-10-26 NOTE — Assessment & Plan Note (Signed)
Improved and corrected, pt to be applauded on this Hyperlipidemia:Low fat diet discussed and encouraged.   Lipid Panel  Lab Results  Component Value Date   CHOL 158 10/20/2014   HDL 53 10/20/2014   LDLCALC 88 10/20/2014   TRIG 85 10/20/2014   CHOLHDL 3.0 10/20/2014

## 2014-10-26 NOTE — Assessment & Plan Note (Signed)
Deteriorated. Patient re-educated about  the importance of commitment to a  minimum of 150 minutes of exercise per week.  The importance of healthy food choices with portion control discussed. Encouraged to start a food diary, count calories and to consider  joining a support group. Sample diet sheets offered. Goals set by the patient for the next several months.   Weight /BMI 10/20/2014 02/17/2014 01/09/2014  WEIGHT 209 lb 205 lb 211 lb  HEIGHT 5\' 6"  5\' 6"  5\' 6"   BMI 33.75 kg/m2 33.1 kg/m2 34.07 kg/m2    * Current exercise per week 150  minutes.

## 2014-10-26 NOTE — Progress Notes (Signed)
Subjective:    Patient ID: Teresa Arellano, female    DOB: 1950/08/10, 64 y.o.   MRN: [id number]  HPI The PT is here for follow up and re-evaluation of chronic medical conditions, medication management and review of any available recent lab and radiology data.  Preventive health is updated, specifically  Cancer screening and Immunization.Needs mammogram and colonoscopy still   Questions or concerns regarding consultations or procedures which the PT has had in the interim are  addressed. The PT denies any adverse reactions to current medications since the last visit.  1 month h/o left heel pain ,  Worse on initial weight bearing, no direct or indirect recent trauma     Review of Systems See HPI Denies recent fever or chills. Denies sinus pressure, nasal congestion, ear pain or sore throat. Denies chest congestion, productive cough or wheezing. Denies chest pains, palpitations and leg swelling Denies abdominal pain, nausea, vomiting,diarrhea or constipation.   Denies dysuria, frequency, hesitancy or incontinence.  Denies headaches, seizures, numbness, or tingling. Denies depression, anxiety or insomnia. Denies skin break down or rash.        Objective:   Physical Exam  .,vs Patient alert and oriented and in no cardiopulmonary distress.  HEENT: No facial asymmetry, EOMI,   oropharynx pink and moist.  Neck supple no JVD, no mass.  Chest: Clear to auscultation bilaterally.  CVS: S1, S2 no murmurs, no S3.Regular rate.  ABD: Soft non tender.   Ext: No edema  MS: Adequate ROM spine, shoulders, hips and knees.Tender on palpation of left heel  Skin: Intact, no ulcerations or rash noted.  Psych: Good eye contact, normal affect. Memory intact not anxious or depressed appearing.  CNS: CN 2-12 intact, power,  normal throughout.no focal deficits noted.       Assessment & Plan:  Pain of left heel Uncontrolled.Toradol and depo medrol administered IM in the office , to be  followed by a short course of oral prednisone and NSAIDS.Pt will call back if not improved   Need for Zostavax administration After obtaining informed consent, the vaccine is  administered by LPN.   Essential hypertension, benign Controlled, no change in medication DASH diet and commitment to daily physical activity for a minimum of 30 minutes discussed and encouraged, as a part of hypertension management. The importance of attaining a healthy weight is also discussed.  BP/Weight 10/20/2014 02/17/2014 01/09/2014 09/10/2013 07/11/2013 07/15/6254 06/17/9371  Systolic BP 428 768 115 726 203 559 -  Diastolic BP 76 78 98 86 79 68 -  Wt. (Lbs) 209 205 211 208.4 198 - 200  BMI 33.75 33.1 34.07 33.65 31 - 32.3        Dyslipidemia Improved and corrected, pt to be applauded on this Hyperlipidemia:Low fat diet discussed and encouraged.   Lipid Panel  Lab Results  Component Value Date   CHOL 158 10/20/2014   HDL 53 10/20/2014   LDLCALC 88 10/20/2014   TRIG 85 10/20/2014   CHOLHDL 3.0 10/20/2014        Obesity (BMI 30.0-34.9) Deteriorated. Patient re-educated about  the importance of commitment to a  minimum of 150 minutes of exercise per week.  The importance of healthy food choices with portion control discussed. Encouraged to start a food diary, count calories and to consider  joining a support group. Sample diet sheets offered. Goals set by the patient for the next several months.   Weight /BMI 10/20/2014 02/17/2014 01/09/2014  WEIGHT 209 lb 205 lb 211 lb  HEIGHT  5\' 6"  5\' 6"  5\' 6"   BMI 33.75 kg/m2 33.1 kg/m2 34.07 kg/m2    * Current exercise per week 150  minutes.   Bronchitis with bronchospasm sinngle episode requiring ED eval in 07/2011, no established diagnosis of  Asthma, has had no recurrence

## 2014-10-26 NOTE — Assessment & Plan Note (Signed)
Controlled, no change in medication DASH diet and commitment to daily physical activity for a minimum of 30 minutes discussed and encouraged, as a part of hypertension management. The importance of attaining a healthy weight is also discussed.  BP/Weight 10/20/2014 02/17/2014 01/09/2014 09/10/2013 07/11/2013 0/04/6551 10/12/8268  Systolic BP 786 754 492 010 071 219 -  Diastolic BP 76 78 98 86 79 68 -  Wt. (Lbs) 209 205 211 208.4 198 - 200  BMI 33.75 33.1 34.07 33.65 31 - 32.3

## 2014-11-07 ENCOUNTER — Other Ambulatory Visit: Payer: Self-pay

## 2014-11-07 ENCOUNTER — Other Ambulatory Visit: Payer: Self-pay | Admitting: Family Medicine

## 2014-11-07 MED ORDER — TRIAMTERENE-HCTZ 37.5-25 MG PO TABS: 1.0000 | ORAL_TABLET | Freq: Every day | ORAL | Status: DC

## 2014-11-09 ENCOUNTER — Other Ambulatory Visit: Payer: Self-pay

## 2014-11-09 MED ORDER — TRIAMTERENE-HCTZ 37.5-25 MG PO TABS: 1.0000 | ORAL_TABLET | Freq: Every day | ORAL | Status: DC

## 2014-11-12 ENCOUNTER — Ambulatory Visit (HOSPITAL_COMMUNITY)
Admission: RE | Admit: 2014-11-12 | Discharge: 2014-11-12 | Disposition: A | Discharge status: Home / Self Care | Payer: 59 | Source: Ambulatory Visit | Attending: Family Medicine | Admitting: Family Medicine

## 2014-11-12 ENCOUNTER — Ambulatory Visit (HOSPITAL_COMMUNITY): Payer: 59

## 2014-11-12 ENCOUNTER — Other Ambulatory Visit: Payer: Self-pay | Admitting: Family Medicine

## 2014-11-12 DIAGNOSIS — Z1231 Encounter for screening mammogram for malignant neoplasm of breast: Secondary | ICD-10-CM | POA: Diagnosis not present

## 2014-11-18 ENCOUNTER — Ambulatory Visit (HOSPITAL_COMMUNITY): Payer: 59

## 2015-01-21 ENCOUNTER — Other Ambulatory Visit: Payer: Self-pay

## 2015-01-21 DIAGNOSIS — M79672 Pain in left foot: Secondary | ICD-10-CM

## 2015-01-21 MED ORDER — MELOXICAM 15 MG PO TABS: 15.0000 mg | ORAL_TABLET | Freq: Every day | ORAL | Status: DC

## 2015-03-24 ENCOUNTER — Ambulatory Visit: Payer: 59 | Admitting: Family Medicine

## 2015-05-07 ENCOUNTER — Encounter: Payer: Self-pay | Admitting: Family Medicine

## 2015-05-07 ENCOUNTER — Ambulatory Visit (INDEPENDENT_AMBULATORY_CARE_PROVIDER_SITE_OTHER): Payer: BLUE CROSS/BLUE SHIELD | Admitting: Family Medicine

## 2015-05-07 VITALS — BP 120/84 | HR 76 | Temp 98.7°F | Resp 16 | Ht 66.0 in | Wt 206.0 lb

## 2015-05-07 DIAGNOSIS — J209 Acute bronchitis, unspecified: Secondary | ICD-10-CM | POA: Diagnosis not present

## 2015-05-07 DIAGNOSIS — E669 Obesity, unspecified: Secondary | ICD-10-CM | POA: Diagnosis not present

## 2015-05-07 DIAGNOSIS — I10 Essential (primary) hypertension: Secondary | ICD-10-CM | POA: Diagnosis not present

## 2015-05-07 MED ORDER — PROMETHAZINE-DM 6.25-15 MG/5ML PO SYRP: ORAL_SOLUTION | ORAL | Status: DC

## 2015-05-07 MED ORDER — BENZONATATE 100 MG PO CAPS: 100.0000 mg | ORAL_CAPSULE | Freq: Two times a day (BID) | ORAL | Status: DC | PRN

## 2015-05-07 MED ORDER — PREDNISONE 5 MG PO TABS: 5.0000 mg | ORAL_TABLET | Freq: Two times a day (BID) | ORAL | Status: AC

## 2015-05-07 NOTE — Progress Notes (Signed)
   Subjective:    Patient ID: Teresa Arellano, female    DOB: [birth year], 65 y.o.   MRN: [id number]  HPI   Teresa Arellano     MRN: [id number]      DOB: [birth year]   HPI Ms. Offenbacker is here for follow up and re-evaluation of chronic medical conditions, medication management and review of any available recent lab and radiology data.  Preventive health is updated, specifically  Cancer screening and Immunization.   Questions or concerns regarding consultations or procedures which the PT has had in the interim are  addressed. The PT denies any adverse reactions to current medications since the last visit.  3 week h/o increased cough and chest congestion, sputum sometimes thick, no fever or chills  ROS Denies recent fever or chills. Denies sinus pressure, nasal congestion, ear pain or sore throat. . Denies chest pains, palpitations and leg swelling Denies abdominal pain, nausea, vomiting,diarrhea or constipation.   Denies dysuria, frequency, hesitancy or incontinence. Denies joint pain, swelling and limitation in mobility. Denies headaches, seizures, numbness, or tingling. Denies depression, anxiety or insomnia. Denies skin break down or rash.   PE  BP 120/84 mmHg  Pulse 76  Temp(Src) 98.7 F (37.1 C) (Oral)  Resp 16  Ht 5\' 6"  (1.676 m)  Wt 206 lb (93.441 kg)  BMI 33.27 kg/m2  SpO2 99%  Patient alert and oriented and in no cardiopulmonary distress.  HEENT: No facial asymmetry, EOMI,   oropharynx pink and moist.  Neck supple no JVD, no mass. TM clear, no sinus tenderness, erythema and edema of nasal mucosa Chest: Clear to auscultation bilaterally.  CVS: S1, S2 no murmurs, no S3.Regular rate.  ABD: Soft non tender.   Ext: No edema  MS: Adequate ROM spine, shoulders, hips and knees.  Skin: Intact, no ulcerations or rash noted.  Psych: Good eye contact, normal affect. Memory intact not anxious or depressed appearing.  CNS: CN 2-12 intact, power,  normal throughout.no  focal deficits noted.   Assessment & Plan  Acute bronchitis Symptomatic meds only, no h/o fever, no antibiotic prescribed, pt to call back if worsens  Essential hypertension, benign Controlled, no change in medication DASH diet and commitment to daily physical activity for a minimum of 30 minutes discussed and encouraged, as a part of hypertension management. The importance of attaining a healthy weight is also discussed.  BP/Weight 05/07/2015 10/20/2014 02/17/2014 01/09/2014 09/10/2013 123456 [id number]  Systolic BP 123456 [id number] [id number]  Diastolic BP 84 76 78 98 86 79 68  Wt. (Lbs) 206 209 205 211 208.4 198 -  BMI 33.27 33.75 33.1 34.07 33.65 31 -        Obesity (BMI 30.0-34.9) Unchanged. Patient re-educated about  the importance of commitment to a  minimum of 150 minutes of exercise per week.  The importance of healthy food choices with portion control discussed. Encouraged to start a food diary, count calories and to consider  joining a support group. Sample diet sheets offered. Goals set by the patient for the next several months.   Weight /BMI 05/07/2015 10/20/2014 02/17/2014  WEIGHT 206 lb 209 lb 205 lb  HEIGHT 5\' 6"  5\' 6"  5\' 6"   BMI 33.27 kg/m2 33.75 kg/m2 33.1 kg/m2    Current exercise per week 150 mins        Review of Systems     Objective:   Physical Exam        Assessment & Plan:

## 2015-05-07 NOTE — Patient Instructions (Addendum)
Annual exam in July 5 or after, call if you neeed me sooner  PLS SCHEDULE your colonoscopy, you need this   Medications are sent for cough and congestion.  Continue current medication for blood pressure  Hope you feel better , call back if not or worsened  Thanks for choosing [facility], we consider it a privelige to serve you.

## 2015-05-17 ENCOUNTER — Other Ambulatory Visit: Payer: Self-pay | Admitting: Family Medicine

## 2015-05-18 ENCOUNTER — Other Ambulatory Visit: Payer: Self-pay

## 2015-05-18 MED ORDER — TRIAMTERENE-HCTZ 37.5-25 MG PO TABS: 1.0000 | ORAL_TABLET | Freq: Every day | ORAL | Status: DC

## 2015-05-23 NOTE — Assessment & Plan Note (Signed)
Controlled, no change in medication DASH diet and commitment to daily physical activity for a minimum of 30 minutes discussed and encouraged, as a part of hypertension management. The importance of attaining a healthy weight is also discussed.  BP/Weight 05/07/2015 10/20/2014 02/17/2014 01/09/2014 09/10/2013 123456 [id number]  Systolic BP 123456 [id number] Q000111Q  Diastolic BP 84 76 78 98 86 79 68  Wt. (Lbs) 206 209 205 211 208.4 198 -  BMI 33.27 33.75 33.1 34.07 33.65 31 -

## 2015-05-23 NOTE — Assessment & Plan Note (Signed)
Unchanged. Patient re-educated about  the importance of commitment to a  minimum of 150 minutes of exercise per week.  The importance of healthy food choices with portion control discussed. Encouraged to start a food diary, count calories and to consider  joining a support group. Sample diet sheets offered. Goals set by the patient for the next several months.   Weight /BMI 05/07/2015 10/20/2014 02/17/2014  WEIGHT 206 lb 209 lb 205 lb  HEIGHT 5\' 6"  5\' 6"  5\' 6"   BMI 33.27 kg/m2 33.75 kg/m2 33.1 kg/m2    Current exercise per week 150 mins

## 2015-05-23 NOTE — Assessment & Plan Note (Signed)
Symptomatic meds only, no h/o fever, no antibiotic prescribed, pt to call back if worsens

## 2015-06-08 ENCOUNTER — Telehealth: Payer: Self-pay | Admitting: Family Medicine

## 2015-06-08 NOTE — Telephone Encounter (Signed)
Returned patient call.  Notified her that hemorrhoid preparations are otc and most insurances no longer cover.  She will get otc suppositories.  Will also mail her out patient education on increasing dietary fiber.    She will call back with any further complaints.

## 2015-06-08 NOTE — Telephone Encounter (Signed)
Patient is calling asking if there is anything she can use for hemorrhoids, please advise?

## 2015-10-27 ENCOUNTER — Encounter: Payer: BLUE CROSS/BLUE SHIELD | Admitting: Family Medicine

## 2015-11-05 ENCOUNTER — Encounter: Payer: BLUE CROSS/BLUE SHIELD | Admitting: Family Medicine

## 2015-11-26 ENCOUNTER — Ambulatory Visit (INDEPENDENT_AMBULATORY_CARE_PROVIDER_SITE_OTHER): Payer: BLUE CROSS/BLUE SHIELD | Admitting: Family Medicine

## 2015-11-26 ENCOUNTER — Encounter: Payer: Self-pay | Admitting: Family Medicine

## 2015-11-26 ENCOUNTER — Other Ambulatory Visit: Payer: Self-pay | Admitting: Family Medicine

## 2015-11-26 ENCOUNTER — Other Ambulatory Visit (HOSPITAL_COMMUNITY)
Admission: RE | Admit: 2015-11-26 | Discharge: 2015-11-26 | Disposition: A | Discharge status: Home / Self Care | Payer: BLUE CROSS/BLUE SHIELD | Source: Ambulatory Visit | Attending: Family Medicine | Admitting: Family Medicine

## 2015-11-26 VITALS — BP 138/82 | HR 60 | Resp 18 | Ht 66.0 in | Wt 182.0 lb

## 2015-11-26 DIAGNOSIS — Z0000 Encounter for general adult medical examination without abnormal findings: Secondary | ICD-10-CM | POA: Diagnosis not present

## 2015-11-26 DIAGNOSIS — E669 Obesity, unspecified: Secondary | ICD-10-CM

## 2015-11-26 DIAGNOSIS — Z01419 Encounter for gynecological examination (general) (routine) without abnormal findings: Secondary | ICD-10-CM | POA: Insufficient documentation

## 2015-11-26 DIAGNOSIS — I10 Essential (primary) hypertension: Secondary | ICD-10-CM | POA: Diagnosis not present

## 2015-11-26 DIAGNOSIS — Z1211 Encounter for screening for malignant neoplasm of colon: Secondary | ICD-10-CM

## 2015-11-26 DIAGNOSIS — E785 Hyperlipidemia, unspecified: Secondary | ICD-10-CM

## 2015-11-26 DIAGNOSIS — Z1159 Encounter for screening for other viral diseases: Secondary | ICD-10-CM

## 2015-11-26 DIAGNOSIS — Z124 Encounter for screening for malignant neoplasm of cervix: Secondary | ICD-10-CM

## 2015-11-26 DIAGNOSIS — Z23 Encounter for immunization: Secondary | ICD-10-CM

## 2015-11-26 DIAGNOSIS — Z1231 Encounter for screening mammogram for malignant neoplasm of breast: Secondary | ICD-10-CM

## 2015-11-26 LAB — POC HEMOCCULT BLD/STL (OFFICE/1-CARD/DIAGNOSTIC): FECAL OCCULT BLD: NEGATIVE

## 2015-11-26 NOTE — Patient Instructions (Signed)
F/u in 6 month, call if you need me before  CONGRATS on healthy lifestyle and weight loss, keep it up!  sTAY on blood pressure med, you need this  Fasting labs this week please  You need a mammogram and a colonoscopy, importannt for cancer screening  Flu vaccine today  Thank you  for choosing [facility]. We consider it a privelige to serve you.  Delivering excellent health care in a caring and  compassionate way is our goal.  Partnering with you,  so that together we can achieve this goal is our strategy.

## 2015-11-27 LAB — LIPID PANEL
CHOL/HDL RATIO: 2.6 ratio (ref <=5.0)
CHOLESTEROL: 168 mg/dL (ref 125–200)
HDL: 64 mg/dL (ref >=46)
LDL Cholesterol: 87 mg/dL (ref <130)
TRIGLYCERIDES: 85 mg/dL (ref <150)
VLDL: 17 mg/dL (ref <30)

## 2015-11-27 LAB — CBC
HCT: 35 % (ref 35.0–45.0)
Hemoglobin: 11.5 g/dL — ABNORMAL LOW (ref 11.7–15.5)
MCH: 26.1 pg — ABNORMAL LOW (ref 27.0–33.0)
MCHC: 32.9 g/dL (ref 32.0–36.0)
MCV: 79.5 fL — AB (ref 80.0–100.0)
MPV: 9.1 fL (ref 7.5–12.5)
PLATELETS: 289 10*3/uL (ref 140–400)
RBC: 4.4 MIL/uL (ref 3.80–5.10)
RDW: 14.1 % (ref 11.0–15.0)
WBC: 6.7 10*3/uL (ref 3.8–10.8)

## 2015-11-27 LAB — BASIC METABOLIC PANEL
BUN: 16 mg/dL (ref 7–25)
CHLORIDE: 104 mmol/L (ref 98–110)
CO2: 28 mmol/L (ref 20–31)
CREATININE: 0.73 mg/dL (ref 0.50–0.99)
Calcium: 9.3 mg/dL (ref 8.6–10.4)
Glucose, Bld: 84 mg/dL (ref 65–99)
Potassium: 3.4 mmol/L — ABNORMAL LOW (ref 3.5–5.3)
Sodium: 140 mmol/L (ref 135–146)

## 2015-11-27 LAB — HEPATITIS C ANTIBODY: HCV Ab: NEGATIVE

## 2015-11-27 LAB — TSH: TSH: 0.55 mIU/L

## 2015-11-27 LAB — HEMOGLOBIN A1C
HEMOGLOBIN A1C: 5.1 % (ref <5.7)
Mean Plasma Glucose: 100 mg/dL

## 2015-11-27 NOTE — Progress Notes (Signed)
    Teresa Arellano     MRN: [id number]      DOB: 1951/03/16  HPI: Patient is in for annual physical exam. No other health concerns are expressed or addressed at the visit. Recent labs, if available are reviewed. Immunization is reviewed , and  updated if needed.   PE: Pleasant  female, alert and oriented x 3, in no cardio-pulmonary distress. Afebrile. HEENT No facial trauma or asymetry. Sinuses non tender.  Extra occullar muscles intact, pupils equally reactive to light. External ears normal, tympanic membranes clear. Oropharynx moist, no exudate. Neck: supple, no adenopathy,JVD or thyromegaly.No bruits.  Chest: Clear to ascultation bilaterally.No crackles or wheezes. Non tender to palpation  Breast: No asymetry,no masses or lumps. No tenderness. No nipple discharge or inversion. No axillary or supraclavicular adenopathy  Cardiovascular system; Heart sounds normal,  S1 and  S2 ,no S3.  No murmur, or thrill. Apical beat not displaced Peripheral pulses normal.  Abdomen: Soft, non tender, no organomegaly or masses. No bruits. Bowel sounds normal. No guarding, tenderness or rebound.  Rectal:  Normal sphincter tone. No rectal mass. Guaiac negative stool.  GU: External genitalia normal female genitalia , normal female distribution of hair. No lesions. Urethral meatus normal in size, no  Prolapse, no lesions visibly  Present. Bladder non tender. Vagina pink and moist , with no visible lesions , discharge present . Adequate pelvic support no  cystocele or rectocele noted  Uterus absent no adnexal masses, no  adnexal tenderness.   Musculoskeletal exam: Full ROM of spine, hips , shoulders and knees. No deformity ,swelling or crepitus noted. No muscle wasting or atrophy.   Neurologic: Cranial nerves 2 to 12 intact. Power, tone ,sensation and reflexes normal throughout. No disturbance in gait. No tremor.  Skin: Intact, no ulceration, erythema , scaling or rash  noted. Pigmentation normal throughout  Psych; Normal mood and affect. Judgement and concentration normal     Annual physical exam Annual exam as documented. Counseling done  re healthy lifestyle involving commitment to 150 minutes exercise per week, heart healthy diet, and attaining healthy weight.The importance of adequate sleep also discussed. Regular seat belt use and home safety, is also discussed. Changes in health habits are decided on by the patient with goals and time frames  set for achieving them. Immunization and cancer screening needs are specifically addressed at this visit.   Need for prophylactic vaccination and inoculation against influenza After obtaining informed consent, the vaccine is  administered by LPN.

## 2015-11-27 NOTE — Assessment & Plan Note (Signed)

## 2015-11-27 NOTE — Assessment & Plan Note (Signed)
After obtaining informed consent, the vaccine is  administered by LPN.  

## 2015-11-28 LAB — VITAMIN D 25 HYDROXY (VIT D DEFICIENCY, FRACTURES): Vit D, 25-Hydroxy: 19 ng/mL — ABNORMAL LOW (ref 30–100)

## 2015-11-30 LAB — IRON,TIBC AND FERRITIN PANEL
%SAT: 23 % (ref 11–50)
Ferritin: 345 ng/mL — ABNORMAL HIGH (ref 20–288)
Iron: 55 ug/dL (ref 45–160)
TIBC: 239 ug/dL — AB (ref 250–450)

## 2015-11-30 LAB — CYTOLOGY - PAP

## 2015-12-03 ENCOUNTER — Other Ambulatory Visit: Payer: Self-pay | Admitting: Family Medicine

## 2015-12-03 DIAGNOSIS — Z1231 Encounter for screening mammogram for malignant neoplasm of breast: Secondary | ICD-10-CM

## 2015-12-07 ENCOUNTER — Ambulatory Visit (HOSPITAL_COMMUNITY)
Admission: RE | Admit: 2015-12-07 | Discharge: 2015-12-07 | Disposition: A | Discharge status: Home / Self Care | Payer: BLUE CROSS/BLUE SHIELD | Source: Ambulatory Visit | Attending: Family Medicine | Admitting: Family Medicine

## 2015-12-07 DIAGNOSIS — Z1231 Encounter for screening mammogram for malignant neoplasm of breast: Secondary | ICD-10-CM | POA: Insufficient documentation

## 2016-01-12 ENCOUNTER — Other Ambulatory Visit: Payer: Self-pay | Admitting: Family Medicine

## 2016-02-10 DIAGNOSIS — H5210 Myopia, unspecified eye: Secondary | ICD-10-CM | POA: Diagnosis not present

## 2016-02-10 DIAGNOSIS — H25813 Combined forms of age-related cataract, bilateral: Secondary | ICD-10-CM | POA: Diagnosis not present

## 2016-03-09 ENCOUNTER — Telehealth: Payer: Self-pay

## 2016-03-09 NOTE — Telephone Encounter (Signed)
[phone]  PATIENT CALLED TO SCHEDULE TCS WOULD LIKE IT IN December IF AT ALL POSSIBLE

## 2016-03-10 NOTE — Telephone Encounter (Signed)
LMOM to call.

## 2016-03-11 ENCOUNTER — Telehealth: Payer: Self-pay

## 2016-03-11 NOTE — Telephone Encounter (Signed)
See triage

## 2016-03-11 NOTE — Telephone Encounter (Signed)
Gastroenterology Pre-Procedure Review  Request Date: 04/06/2016 Requesting Physician: Dr. Moshe Cipro  PATIENT REVIEW QUESTIONS: The patient responded to the following health history questions as indicated:    1. Diabetes Melitis: no 2. Joint replacements in the past 12 months: no 3. Major health problems in the past 3 months: no 4. Has an artificial valve or MVP: no 5. Has a defibrillator: no 6. Has been advised in past to take antibiotics in advance of a procedure like teeth cleaning: no 7. Family history of colon cancer: no  8. Alcohol Use: no 9. History of sleep apnea: no  10. History of coronary artery or other vascular stents placed within the last 12 months: no    MEDICATIONS & ALLERGIES:    Patient reports the following regarding taking any blood thinners:   Plavix? no Aspirin? no Coumadin? no Brilinta? no Xarelto? no Eliquis? no Pradaxa? no Savaysa? no Effient? no  Patient confirms/reports the following medications:  Current Outpatient Prescriptions  Medication Sig Dispense Refill  . triamterene-hydrochlorothiazide (MAXZIDE-25) 37.5-25 MG tablet TAKE ONE TABLET BY MOUTH ONCE DAILY (Patient taking differently: Takes 1/2 tablet daily) 90 tablet 0  . aspirin 81 MG tablet Take 81 mg by mouth daily.     No current facility-administered medications for this visit.     Patient confirms/reports the following allergies:  No Known Allergies  No orders of the defined types were placed in this encounter.   AUTHORIZATION INFORMATION Primary Insurance:   ID #:   Group #:  Pre-Cert / Auth required:  Pre-Cert / Auth #:   Secondary Insurance:   ID #:   Group #:  Pre-Cert / Auth required:  Pre-Cert / Auth #:   SCHEDULE INFORMATION: Procedure has been scheduled as follows:  Date:  04/06/2016                Time: 10:30 AM  Location: [facility] Short Stay  This Gastroenterology Pre-Precedure Review Form is being routed to the following provider(s): Barney Drain,  MD

## 2016-03-17 NOTE — Telephone Encounter (Signed)
SUPREP SPLIT DOSING-REGULAR breakfast then CLEAR LIQUIDS after 9 am.   

## 2016-03-18 ENCOUNTER — Other Ambulatory Visit: Payer: Self-pay

## 2016-03-18 DIAGNOSIS — Z1211 Encounter for screening for malignant neoplasm of colon: Secondary | ICD-10-CM

## 2016-03-18 MED ORDER — PEG 3350-KCL-NA BICARB-NACL 420 G PO SOLR: 4000.0000 mL | ORAL | 0 refills | Status: DC

## 2016-03-18 NOTE — Telephone Encounter (Signed)
Rx sent to the pharmacy and instructions mailed to pt.  

## 2016-04-06 ENCOUNTER — Encounter (HOSPITAL_COMMUNITY)
Admission: RE | Disposition: A | Discharge status: Home / Self Care | Payer: Self-pay | Source: Ambulatory Visit | Attending: Gastroenterology

## 2016-04-06 ENCOUNTER — Encounter (HOSPITAL_COMMUNITY): Payer: Self-pay

## 2016-04-06 ENCOUNTER — Ambulatory Visit (HOSPITAL_COMMUNITY)
Admission: RE | Admit: 2016-04-06 | Discharge: 2016-04-06 | Disposition: A | Discharge status: Home / Self Care | Payer: Medicare HMO | Source: Ambulatory Visit | Attending: Gastroenterology | Admitting: Gastroenterology

## 2016-04-06 DIAGNOSIS — Z1211 Encounter for screening for malignant neoplasm of colon: Secondary | ICD-10-CM | POA: Diagnosis not present

## 2016-04-06 DIAGNOSIS — K648 Other hemorrhoids: Secondary | ICD-10-CM | POA: Insufficient documentation

## 2016-04-06 DIAGNOSIS — Z1212 Encounter for screening for malignant neoplasm of rectum: Secondary | ICD-10-CM

## 2016-04-06 DIAGNOSIS — D122 Benign neoplasm of ascending colon: Secondary | ICD-10-CM | POA: Insufficient documentation

## 2016-04-06 DIAGNOSIS — I10 Essential (primary) hypertension: Secondary | ICD-10-CM | POA: Diagnosis not present

## 2016-04-06 DIAGNOSIS — D123 Benign neoplasm of transverse colon: Secondary | ICD-10-CM | POA: Diagnosis not present

## 2016-04-06 DIAGNOSIS — Z800 Family history of malignant neoplasm of digestive organs: Secondary | ICD-10-CM | POA: Insufficient documentation

## 2016-04-06 HISTORY — PX: POLYPECTOMY: SHX5525

## 2016-04-06 HISTORY — PX: COLONOSCOPY: SHX5424

## 2016-04-06 SURGERY — COLONOSCOPY
Anesthesia: Moderate Sedation

## 2016-04-06 MED ORDER — MEPERIDINE HCL 100 MG/ML IJ SOLN
INTRAMUSCULAR | Status: AC
  Filled 2016-04-06: qty 2

## 2016-04-06 MED ORDER — MIDAZOLAM HCL 5 MG/5ML IJ SOLN
INTRAMUSCULAR | Status: AC
  Filled 2016-04-06: qty 10

## 2016-04-06 MED ORDER — MEPERIDINE HCL 100 MG/ML IJ SOLN
INTRAMUSCULAR | Status: DC | PRN
  Administered 2016-04-06 (×2): 25 mg via INTRAVENOUS

## 2016-04-06 MED ORDER — SODIUM CHLORIDE 0.9 % IV SOLN
INTRAVENOUS | Status: DC
  Administered 2016-04-06: 10:00:00 via INTRAVENOUS

## 2016-04-06 MED ORDER — MIDAZOLAM HCL 5 MG/5ML IJ SOLN
INTRAMUSCULAR | Status: DC | PRN
  Administered 2016-04-06 (×3): 2 mg via INTRAVENOUS

## 2016-04-06 MED ORDER — SIMETHICONE 40 MG/0.6ML PO SUSP
ORAL | Status: DC | PRN
  Administered 2016-04-06: 2.5 mL

## 2016-04-06 NOTE — H&P (Signed)
  Primary Care Physician:  Tula Nakayama, MD Primary Gastroenterologist:  Dr. Oneida Alar  Pre-Procedure History & Physical: HPI:  Teresa Arellano is a 65 y.o. female here for [place].  Past Medical History:  Diagnosis Date  . Hypertension 2014    Past Surgical History:  Procedure Laterality Date  . ABDOMINAL HYSTERECTOMY  1982   partial, fibroids  . CHOLECYSTECTOMY  1991 approx  . TUBAL LIGATION  1979    Prior to Admission medications   Medication Sig Start Date End Date Taking? Authorizing Provider  triamterene-hydrochlorothiazide (MAXZIDE-25) 37.5-25 MG tablet TAKE ONE TABLET BY MOUTH ONCE DAILY Patient taking differently: Takes 1/2 tablet daily 01/13/16  Yes Fayrene Helper, MD    Allergies as of 03/18/2016  . (No Known Allergies)    Family History  Problem Relation Age of Onset  . Heart disease Mother   . Diabetes Father   . Heart disease Brother   . Cancer Sister 9    breast   . Cancer Sister     stomach   . Heart disease Brother   . Hypertension Sister   . Hypertension Sister   . Diabetes Sister   . Diabetes Sister     Social History   Social History  . Marital status: Married    Spouse name: N/A  . Number of children: N/A  . Years of education: N/A   Occupational History  . Not on file.   Social History Main Topics  . Smoking status: Never Smoker  . Smokeless tobacco: Never Used  . Alcohol use No  . Drug use: No  . Sexual activity: Not on file   Other Topics Concern  . Not on file   Social History Narrative  . No narrative on file    Review of Systems: See HPI, otherwise negative ROS   Physical Exam: BP 125/70   Pulse 68   Temp 97.9 F (36.6 C) (Oral)   Resp (!) 22   Ht 5\' 7"  (1.702 m)   Wt 179 lb (81.2 kg)   SpO2 100%   BMI 28.04 kg/m  General:   Alert,  pleasant and cooperative in NAD Head:  Normocephalic and atraumatic. Neck:  Supple; Lungs:  Clear throughout to auscultation.    Heart:  Regular rate and  rhythm. Abdomen:  Soft, nontender and nondistended. Normal bowel sounds, without guarding, and without rebound.   Neurologic:  Alert and  oriented x4;  grossly normal neurologically.  Impression/Plan:     SCREENING  Plan:  1. TCS TODAY. DISCUSSED PROCEDURE, BENEFITS, & RISKS: < 1% chance of medication reaction, bleeding, perforation, or rupture of spleen/liver.

## 2016-04-06 NOTE — Op Note (Signed)
[facility] Patient Name: Teresa Arellano Procedure Date: 04/06/2016 11:01 AM MRN: [id number] Date of Birth: 04-29-1950 Attending MD: Barney Drain , MD CSN: [id number] Age: 65 Admit Type: Outpatient Procedure:                Colonoscopy WITH COLD SNARE POLYPECTOMY Indications:              Screening for colorectal malignant neoplasm Providers:                Barney Drain, MD, Lurline Del, RN, Isabella Stalling,                            Technician Referring MD:              Medicines:                Meperidine 50 mg IV, Midazolam 6 mg IV Complications:            No immediate complications. Estimated Blood Loss:     Estimated blood loss was minimal. Procedure:                Pre-Anesthesia Assessment:                           - Prior to the procedure, a History and Physical                            was performed, and patient medications and                            allergies were reviewed. The patient's tolerance of                            previous anesthesia was also reviewed. The risks                            and benefits of the procedure and the sedation                            options and risks were discussed with the patient.                            All questions were answered, and informed consent                            was obtained. Prior Anticoagulants: The patient has                            taken no previous anticoagulant or antiplatelet                            agents. ASA Grade Assessment: I - A normal, healthy                            patient. After reviewing the risks and benefits,  the patient was deemed in satisfactory condition to                            undergo the procedure. After obtaining informed                            consent, the colonoscope was passed under direct                            vision. Throughout the procedure, the patient's                            blood pressure, pulse, and oxygen  saturations were                            monitored continuously. The [id number]                            scope was introduced through the anus and advanced                            to the the terminal ileum. The terminal ileum,                            ileocecal valve, appendiceal orifice, and rectum                            were photographed. The colonoscopy was performed                            without difficulty. The patient tolerated the                            procedure well. The quality of the bowel                            preparation was excellent. Scope In: 11:34:54 AM Scope Out: 11:51:43 AM Scope Withdrawal Time: 0 hours 14 minutes 45 seconds  Total Procedure Duration: 0 hours 16 minutes 49 seconds  Findings:      A 4 mm polyp was found in the hepatic flexure. The polyp was sessile.       The polyp was removed with a cold snare. Resection was complete, but the       polyp tissue was not retrieved.      Internal hemorrhoids were found. The hemorrhoids were small.      The terminal ileum appeared normal. Impression:               - One 4 mm polyp at the hepatic flexure, removed                            with a cold snare. Complete resection. Polyp tissue                            not retrieved.                           -  Internal hemorrhoids.                           - The examined portion of the ileum was normal. Moderate Sedation:      Moderate (conscious) sedation was administered by the endoscopy nurse       and supervised by the endoscopist. The following parameters were       monitored: oxygen saturation, heart rate, blood pressure, and response       to care. Total physician intraservice time was 38 minutes. Recommendation:           - High fiber diet.                           - Continue present medications.                           - Repeat colonoscopy in 5-10 years for surveillance.                           - Patient has a contact  number available for                            emergencies. The signs and symptoms of potential                            delayed complications were discussed with the                            patient. Return to normal activities tomorrow.                            Written discharge instructions were provided to the                            patient. Procedure Code(s):        --- Professional ---                           [phone], Colonoscopy, flexible; with removal of                            tumor(s), polyp(s), or other lesion(s) by snare                            technique                           99152, Moderate sedation services provided by the                            same physician or other qualified health care                            professional performing the diagnostic or  therapeutic service that the sedation supports,                            requiring the presence of an independent trained                            observer to assist in the monitoring of the                            patient's level of consciousness and physiological                            status; initial 15 minutes of intraservice time,                            patient age 72 years or older                           [phone], Moderate sedation services; each additional                            15 minutes intraservice time                           99153, Moderate sedation services; each additional                            15 minutes intraservice time Diagnosis Code(s):        --- Professional ---                           Z12.11, Encounter for screening for malignant                            neoplasm of colon                           D12.3, Benign neoplasm of transverse colon (hepatic                            flexure or splenic flexure)                           K64.8, Other hemorrhoids CPT copyright 2016 [employer]. All rights  reserved. The codes documented in this report are preliminary and upon coder review may  be revised to meet current compliance requirements. Barney Drain, MD Barney Drain, MD 04/06/2016 12:14:52 PM This report has been signed electronically. Number of Addenda: 0

## 2016-04-06 NOTE — Discharge Instructions (Signed)
You had 1 polyp removed, but it broke up and could not be retrieved. IT WAS SMALL AND COMPLETELY RESECTED. You have internal hemorrhoids.   CONTINUE YOUR WEIGHT LOSS EFFORTS. You have done an excellent job losing your weight. In JAN 2017 YOU WERE 206 LBS. NOW YOU ARE 179 LBS. YOU ONLY NEED TO LOSE ABOUT TEN MORE POUNDS.    DRINK WATER TO KEEP YOUR URINE LIGHT YELLOW.  FOLLOW A HIGH FIBER DIET. AVOID ITEMS THAT CAUSE BLOATING & GAS. SEE INFO BELOW.  Next colonoscopy in 5-10 years.    Colonoscopy Care After Read the instructions outlined below and refer to this sheet in the next week. These discharge instructions provide you with general information on caring for yourself after you leave the hospital. While your treatment has been planned according to the most current medical practices available, unavoidable complications occasionally occur. If you have any problems or questions after discharge, call DR. Safiyyah Vasconez, [phone].  ACTIVITY  You may resume your regular activity, but move at a slower pace for the next 24 hours.   Take frequent rest periods for the next 24 hours.   Walking will help get rid of the air and reduce the bloated feeling in your belly (abdomen).   No driving for 24 hours (because of the medicine (anesthesia) used during the test).   You may shower.   Do not sign any important legal documents or operate any machinery for 24 hours (because of the anesthesia used during the test).    NUTRITION  Drink plenty of fluids.   You may resume your normal diet as instructed by your doctor.   Begin with a light meal and progress to your normal diet. Heavy or fried foods are harder to digest and may make you feel sick to your stomach (nauseated).   Avoid alcoholic beverages for 24 hours or as instructed.    MEDICATIONS  You may resume your normal medications.   WHAT YOU CAN EXPECT TODAY  Some feelings of bloating in the abdomen.   Passage of more gas than  usual.   Spotting of blood in your stool or on the toilet paper  .  IF YOU HAD POLYPS REMOVED DURING THE COLONOSCOPY:  Eat a soft diet IF YOU HAVE NAUSEA, BLOATING, ABDOMINAL PAIN, OR VOMITING.    FINDING OUT THE RESULTS OF YOUR TEST Not all test results are available during your visit. DR. Oneida Alar WILL CALL YOU WITHIN 14 DAYS OF YOUR PROCEDUE WITH YOUR RESULTS. Do not assume everything is normal if you have not heard from DR. Keidrick Murty, CALL HER OFFICE AT [phone].  SEEK IMMEDIATE MEDICAL ATTENTION AND CALL THE OFFICE: [phone] IF:  You have more than a spotting of blood in your stool.   Your belly is swollen (abdominal distention).   You are nauseated or vomiting.   You have a temperature over 101F.   You have abdominal pain or discomfort that is severe or gets worse throughout the day.   High-Fiber Diet A high-fiber diet changes your normal diet to include more whole grains, legumes, fruits, and vegetables. Changes in the diet involve replacing refined carbohydrates with unrefined foods. The calorie level of the diet is essentially unchanged. The Dietary Reference Intake (recommended amount) for adult males is 38 grams per day. For adult females, it is 25 grams per day. Pregnant and lactating women should consume 28 grams of fiber per day. Fiber is the intact part of a plant that is not broken down during digestion. Functional  fiber is fiber that has been isolated from the plant to provide a beneficial effect in the body. PURPOSE  Increase stool bulk.   Ease and regulate bowel movements.   Lower cholesterol.   REDUCE RISK OF COLON CANCER  INDICATIONS THAT YOU NEED MORE FIBER  Constipation and hemorrhoids.   Uncomplicated diverticulosis (intestine condition) and irritable bowel syndrome.   Weight management.   As a protective measure against hardening of the arteries (atherosclerosis), diabetes, and cancer.   GUIDELINES FOR INCREASING FIBER IN THE DIET  Start  adding fiber to the diet slowly. A gradual increase of about 5 more grams (2 slices of whole-wheat bread, 2 servings of most fruits or vegetables, or 1 bowl of high-fiber cereal) per day is best. Too rapid an increase in fiber may result in constipation, flatulence, and bloating.   Drink enough water and fluids to keep your urine clear or pale yellow. Water, juice, or caffeine-free drinks are recommended. Not drinking enough fluid may cause constipation.   Eat a variety of high-fiber foods rather than one type of fiber.   Try to increase your intake of fiber through using high-fiber foods rather than fiber pills or supplements that contain small amounts of fiber.   The goal is to change the types of food eaten. Do not supplement your present diet with high-fiber foods, but replace foods in your present diet.   INCLUDE A VARIETY OF FIBER SOURCES  Replace refined and processed grains with whole grains, canned fruits with fresh fruits, and incorporate other fiber sources. White rice, white breads, and most bakery goods contain little or no fiber.   Brown whole-grain rice, buckwheat oats, and many fruits and vegetables are all good sources of fiber. These include: broccoli, Brussels sprouts, cabbage, cauliflower, beets, sweet potatoes, white potatoes (skin on), carrots, tomatoes, eggplant, squash, berries, fresh fruits, and dried fruits.   Cereals appear to be the richest source of fiber. Cereal fiber is found in whole grains and bran. Bran is the fiber-rich outer coat of cereal grain, which is largely removed in refining. In whole-grain cereals, the bran remains. In breakfast cereals, the largest amount of fiber is found in those with "bran" in their names. The fiber content is sometimes indicated on the label.   You may need to include additional fruits and vegetables each day.   In baking, for 1 cup white flour, you may use the following substitutions:   1 cup whole-wheat flour minus 2  tablespoons.   1/2 cup white flour plus 1/2 cup whole-wheat flour.   Polyps, Colon  A polyp is extra tissue that grows inside your body. Colon polyps grow in the large intestine. The large intestine, also called the colon, is part of your digestive system. It is a long, hollow tube at the end of your digestive tract where your body makes and stores stool. Most polyps are not dangerous. They are benign. This means they are not cancerous. But over time, some types of polyps can turn into cancer. Polyps that are smaller than a pea are usually not harmful. But larger polyps could someday become or may already be cancerous. To be safe, doctors remove all polyps and test them.   PREVENTION There is not one sure way to prevent polyps. You might be able to lower your risk of getting them if you:  Eat more fruits and vegetables and less fatty food.   Do not smoke.   Avoid alcohol.   Exercise every day.   Lose  weight if you are overweight.   Eating more calcium and folate can also lower your risk of getting polyps. Some foods that are rich in calcium are milk, cheese, and broccoli. Some foods that are rich in folate are chickpeas, kidney beans, and spinach.   Hemorrhoids Hemorrhoids are dilated (enlarged) veins around the rectum. Sometimes clots will form in the veins. This makes them swollen and painful. These are called thrombosed hemorrhoids. Causes of hemorrhoids include:  Constipation.   Straining to have a bowel movement.   HEAVY LIFTING  HOME CARE INSTRUCTIONS  Eat a well balanced diet and drink 6 to 8 glasses of water every day to avoid constipation. You may also use a bulk laxative.   Avoid straining to have bowel movements.   Keep anal area dry and clean.   Do not use a donut shaped pillow or sit on the toilet for long periods. This increases blood pooling and pain.   Move your bowels when your body has the urge; this will require less straining and will decrease pain and  pressure.

## 2016-04-07 ENCOUNTER — Encounter (HOSPITAL_COMMUNITY): Payer: Self-pay | Admitting: Gastroenterology

## 2016-04-18 DIAGNOSIS — H25813 Combined forms of age-related cataract, bilateral: Secondary | ICD-10-CM | POA: Diagnosis not present

## 2016-04-18 DIAGNOSIS — H5213 Myopia, bilateral: Secondary | ICD-10-CM | POA: Diagnosis not present

## 2016-04-18 DIAGNOSIS — H524 Presbyopia: Secondary | ICD-10-CM | POA: Diagnosis not present

## 2016-04-18 DIAGNOSIS — H52223 Regular astigmatism, bilateral: Secondary | ICD-10-CM | POA: Diagnosis not present

## 2016-04-19 DIAGNOSIS — Z6829 Body mass index (BMI) 29.0-29.9, adult: Secondary | ICD-10-CM | POA: Diagnosis not present

## 2016-04-19 DIAGNOSIS — Z79899 Other long term (current) drug therapy: Secondary | ICD-10-CM | POA: Diagnosis not present

## 2016-04-19 DIAGNOSIS — Z0000 Encounter for general adult medical examination without abnormal findings: Secondary | ICD-10-CM | POA: Diagnosis not present

## 2016-04-19 DIAGNOSIS — I10 Essential (primary) hypertension: Secondary | ICD-10-CM | POA: Diagnosis not present

## 2016-04-19 DIAGNOSIS — H6123 Impacted cerumen, bilateral: Secondary | ICD-10-CM | POA: Diagnosis not present

## 2016-04-22 DIAGNOSIS — H25811 Combined forms of age-related cataract, right eye: Secondary | ICD-10-CM | POA: Diagnosis not present

## 2016-04-25 NOTE — Patient Instructions (Signed)
Teresa Arellano  04/25/2016     @PREFPERIOPPHARMACY @   Your procedure is scheduled on 05/02/2016.  Report to [facility] at 9:00 A.M.  Call this number if you have problems the morning of surgery:  [phone]   Remember:  Do not eat food or drink liquids after midnight.  Take these medicines the morning of surgery with A SIP OF WATER None   Do not wear jewelry, make-up or nail polish.  Do not wear lotions, powders, or perfumes, or deoderant.  Do not shave 48 hours prior to surgery.  Men may shave face and neck.  Do not bring valuables to the hospital.  [facility] is not responsible for any belongings or valuables.  Contacts, dentures or bridgework may not be worn into surgery.  Leave your suitcase in the car.  After surgery it may be brought to your room.  For patients admitted to the hospital, discharge time will be determined by your treatment team.  Patients discharged the day of surgery will not be allowed to drive home.    Please read over the following fact sheets that you were given. Anesthesia Post-op Instructions     PATIENT INSTRUCTIONS POST-ANESTHESIA  IMMEDIATELY FOLLOWING SURGERY:  Do not drive or operate machinery for the first twenty four hours after surgery.  Do not make any important decisions for twenty four hours after surgery or while taking narcotic pain medications or sedatives.  If you develop intractable nausea and vomiting or a severe headache please notify your doctor immediately.  FOLLOW-UP:  Please make an appointment with your surgeon as instructed. You do not need to follow up with anesthesia unless specifically instructed to do so.  WOUND CARE INSTRUCTIONS (if applicable):  Keep a dry clean dressing on the anesthesia/puncture wound site if there is drainage.  Once the wound has quit draining you may leave it open to air.  Generally you should leave the bandage intact for twenty four hours unless there is drainage.  If the epidural site drains  for more than 36-48 hours please call the anesthesia department.  QUESTIONS?:  Please feel free to call your physician or the hospital operator if you have any questions, and they will be happy to assist you.      Cataract Surgery Cataract surgery is a procedure to remove a cataract from your eye. A cataract is cloudiness on the lens of your eye. The lens focuses light inside the eye. When a lens becomes cloudy, your vision is affected. Cataract surgery is a procedure to remove the cloudy lens. A substitute lens (intraocular lens or IOL) is usually inserted as a replacement for the cloudy lens. Tell a health care provider about:  Any allergies you have.  All medicines you are taking, including vitamins, herbs, eye drops, creams, and over-the-counter medicines.  Any problems you or family members have had with anesthetic medicines.  Any blood disorders you have.  Any surgeries you have had, especially eye surgeries that include refractive surgery, such as PRK and LASIK.  Any medical conditions you have.  Whether you are pregnant or may be pregnant. What are the risks? Generally, this is a safe procedure. However, problems may occur, including:  Infection.  Bleeding.  Glaucoma.  Retinal detachment.  Allergic reactions to medicines.  Damage to other structures or organs.  Inflammation of the eye.  Clouding of the part of your eye that holds an IOL in place (after-cataract), if an IOL was inserted. This is fairly common.  An  IOL moving out of position, if an IOL was inserted. This is very rare.  Loss of vision. This is rare. What happens before the procedure?  Follow instructions from your health care provider about eating or drinking restrictions.  Ask your health care provider about:  Changing or stopping your regular medicines, including any eye drops you have been prescribed. This is especially important if you are taking diabetes medicines or blood  thinners.  Taking medicines such as aspirin and ibuprofen. These medicines can thin your blood. Do not take these medicines before your procedure if your health care provider instructs you not to.  Do not put contact lenses in either eye on the day of your surgery.  Plan for someone to drive you to and from the procedure.  If you will be going home right after the procedure, plan to have someone with you for 24 hours. What happens during the procedure?  An IV tube may be inserted into one of your veins.  You will be given one or more of the following:  A medicine to help you relax (sedative).  A medicine to numb the area (local anesthetic). This may be numbing eye drops or an injection that is given behind the eye.  A small cut (incision) will be made to the edge of the clear, dome-shaped surface that covers the front of the eye (cornea).  A small probe will be inserted into the eye. This device gives off ultrasound waves that soften and break up the cloudy center of the lens. This makes it easier for the cloudy lens to be removed by suction.  An IOL may be implanted.  Part of the capsule that surrounds the lens will be left in the eye to support the IOL.  Your surgeon may use stitches (sutures) to close the incision. The procedure may vary among health care providers and hospitals. What happens after the procedure?  Your blood pressure, heart rate, breathing rate, and blood oxygen level will be monitored often until the medicines you were given have worn off.  You may be given a protective shield to wear over your eyes.  Do not drive for 24 hours if you received a sedative. This information is not intended to replace advice given to you by your health care provider. Make sure you discuss any questions you have with your health care provider. Document Released: 03/17/2011 Document Revised: 09/03/2015 Document Reviewed: 02/05/2015 Elsevier Interactive Patient Education  2017  [employer].

## 2016-04-27 ENCOUNTER — Encounter (HOSPITAL_COMMUNITY)
Admission: RE | Admit: 2016-04-27 | Discharge: 2016-04-27 | Disposition: A | Discharge status: Home / Self Care | Payer: Medicare HMO | Source: Ambulatory Visit | Attending: Ophthalmology | Admitting: Ophthalmology

## 2016-04-27 ENCOUNTER — Encounter (HOSPITAL_COMMUNITY): Payer: Self-pay

## 2016-04-27 DIAGNOSIS — Z79899 Other long term (current) drug therapy: Secondary | ICD-10-CM | POA: Diagnosis not present

## 2016-04-27 DIAGNOSIS — Z9889 Other specified postprocedural states: Secondary | ICD-10-CM | POA: Diagnosis not present

## 2016-04-27 DIAGNOSIS — Z803 Family history of malignant neoplasm of breast: Secondary | ICD-10-CM | POA: Diagnosis not present

## 2016-04-27 DIAGNOSIS — Z01810 Encounter for preprocedural cardiovascular examination: Secondary | ICD-10-CM | POA: Diagnosis not present

## 2016-04-27 DIAGNOSIS — Z8249 Family history of ischemic heart disease and other diseases of the circulatory system: Secondary | ICD-10-CM | POA: Diagnosis not present

## 2016-04-27 DIAGNOSIS — Z800 Family history of malignant neoplasm of digestive organs: Secondary | ICD-10-CM | POA: Insufficient documentation

## 2016-04-27 DIAGNOSIS — Z01812 Encounter for preprocedural laboratory examination: Secondary | ICD-10-CM | POA: Diagnosis not present

## 2016-04-27 DIAGNOSIS — I10 Essential (primary) hypertension: Secondary | ICD-10-CM | POA: Insufficient documentation

## 2016-04-27 DIAGNOSIS — Z833 Family history of diabetes mellitus: Secondary | ICD-10-CM | POA: Diagnosis not present

## 2016-04-27 LAB — CBC
HEMATOCRIT: 36.6 % (ref 36.0–46.0)
Hemoglobin: 12.6 g/dL (ref 12.0–15.0)
MCH: 26.8 pg (ref 26.0–34.0)
MCHC: 34.4 g/dL (ref 30.0–36.0)
MCV: 77.7 fL — AB (ref 78.0–100.0)
Platelets: 272 10*3/uL (ref 150–400)
RBC: 4.71 MIL/uL (ref 3.87–5.11)
RDW: 13.2 % (ref 11.5–15.5)
WBC: 6.2 10*3/uL (ref 4.0–10.5)

## 2016-04-27 LAB — BASIC METABOLIC PANEL
Anion gap: 7 (ref 5–15)
BUN: 19 mg/dL (ref 6–20)
CALCIUM: 9.4 mg/dL (ref 8.9–10.3)
CO2: 24 mmol/L (ref 22–32)
CREATININE: 0.67 mg/dL (ref 0.44–1.00)
Chloride: 105 mmol/L (ref 101–111)
GFR calc Af Amer: >60 mL/min (ref >60)
GFR calc non Af Amer: >60 mL/min (ref >60)
GLUCOSE: 114 mg/dL — AB (ref 65–99)
Potassium: 3.1 mmol/L — ABNORMAL LOW (ref 3.5–5.1)
Sodium: 136 mmol/L (ref 135–145)

## 2016-04-29 MED ORDER — TETRACAINE HCL 0.5 % OP SOLN
OPHTHALMIC | Status: AC
  Filled 2016-04-29: qty 4

## 2016-04-29 MED ORDER — LIDOCAINE HCL 3.5 % OP GEL
OPHTHALMIC | Status: AC
  Filled 2016-04-29: qty 1

## 2016-04-29 MED ORDER — NEOMYCIN-POLYMYXIN-DEXAMETH 3.5-10000-0.1 OP SUSP
OPHTHALMIC | Status: AC
Start: 2016-04-29 — End: 2016-04-29
  Filled 2016-04-29: qty 5

## 2016-04-29 MED ORDER — CYCLOPENTOLATE-PHENYLEPHRINE 0.2-1 % OP SOLN
OPHTHALMIC | Status: AC
  Filled 2016-04-29: qty 2

## 2016-04-29 MED ORDER — LIDOCAINE HCL (PF) 1 % IJ SOLN
INTRAMUSCULAR | Status: AC
  Filled 2016-04-29: qty 2

## 2016-04-29 MED ORDER — PHENYLEPHRINE HCL 2.5 % OP SOLN
OPHTHALMIC | Status: AC
  Filled 2016-04-29: qty 15

## 2016-05-02 ENCOUNTER — Encounter (HOSPITAL_COMMUNITY)
Admission: RE | Disposition: A | Discharge status: Home / Self Care | Payer: Self-pay | Source: Ambulatory Visit | Attending: Ophthalmology

## 2016-05-02 ENCOUNTER — Ambulatory Visit (HOSPITAL_COMMUNITY)
Admission: RE | Admit: 2016-05-02 | Discharge: 2016-05-02 | Disposition: A | Discharge status: Home / Self Care | Payer: Medicare HMO | Source: Ambulatory Visit | Attending: Ophthalmology | Admitting: Ophthalmology

## 2016-05-02 ENCOUNTER — Ambulatory Visit (HOSPITAL_COMMUNITY): Payer: Medicare HMO | Admitting: Anesthesiology

## 2016-05-02 ENCOUNTER — Encounter (HOSPITAL_COMMUNITY): Payer: Self-pay | Admitting: *Deleted

## 2016-05-02 DIAGNOSIS — H25811 Combined forms of age-related cataract, right eye: Secondary | ICD-10-CM | POA: Diagnosis not present

## 2016-05-02 DIAGNOSIS — H2511 Age-related nuclear cataract, right eye: Secondary | ICD-10-CM | POA: Diagnosis not present

## 2016-05-02 DIAGNOSIS — H25812 Combined forms of age-related cataract, left eye: Secondary | ICD-10-CM | POA: Diagnosis not present

## 2016-05-02 HISTORY — PX: CATARACT EXTRACTION W/PHACO: SHX586

## 2016-05-02 SURGERY — PHACOEMULSIFICATION, CATARACT, WITH IOL INSERTION
Anesthesia: Monitor Anesthesia Care | Site: Eye | Laterality: Right

## 2016-05-02 MED ORDER — MIDAZOLAM HCL 2 MG/2ML IJ SOLN
0.5000 mg | INTRAMUSCULAR | Status: DC | PRN
  Administered 2016-05-02: 2 mg via INTRAVENOUS

## 2016-05-02 MED ORDER — PHENYLEPHRINE HCL 2.5 % OP SOLN
1.0000 [drp] | OPHTHALMIC | Status: AC
  Administered 2016-05-02 (×3): 1 [drp] via OPHTHALMIC

## 2016-05-02 MED ORDER — TETRACAINE HCL 0.5 % OP SOLN
1.0000 [drp] | OPHTHALMIC | Status: AC
  Administered 2016-05-02 (×3): 1 [drp] via OPHTHALMIC

## 2016-05-02 MED ORDER — PROVISC 10 MG/ML IO SOLN
INTRAOCULAR | Status: DC | PRN
  Administered 2016-05-02: 0.85 mL via INTRAOCULAR

## 2016-05-02 MED ORDER — BSS IO SOLN
INTRAOCULAR | Status: DC | PRN
  Administered 2016-05-02: 15 mL

## 2016-05-02 MED ORDER — POVIDONE-IODINE 5 % OP SOLN
OPHTHALMIC | Status: DC | PRN
  Administered 2016-05-02: 1 via OPHTHALMIC

## 2016-05-02 MED ORDER — FENTANYL CITRATE (PF) 100 MCG/2ML IJ SOLN
INTRAMUSCULAR | Status: AC
  Filled 2016-05-02: qty 2

## 2016-05-02 MED ORDER — LIDOCAINE HCL 3.5 % OP GEL
1.0000 "application " | Freq: Once | OPHTHALMIC | Status: AC
  Administered 2016-05-02: 1 via OPHTHALMIC

## 2016-05-02 MED ORDER — BSS IO SOLN
INTRAOCULAR | Status: DC | PRN
  Administered 2016-05-02: 500 mL

## 2016-05-02 MED ORDER — MIDAZOLAM HCL 2 MG/2ML IJ SOLN
INTRAMUSCULAR | Status: AC
  Filled 2016-05-02: qty 2

## 2016-05-02 MED ORDER — FENTANYL CITRATE (PF) 100 MCG/2ML IJ SOLN
25.0000 ug | INTRAMUSCULAR | Status: AC | PRN
  Administered 2016-05-02 (×2): 25 ug via INTRAVENOUS

## 2016-05-02 MED ORDER — LIDOCAINE 3.5 % OP GEL OPTIME - NO CHARGE
OPHTHALMIC | Status: DC | PRN
  Administered 2016-05-02: 1 [drp] via OPHTHALMIC

## 2016-05-02 MED ORDER — LACTATED RINGERS IV SOLN
INTRAVENOUS | Status: DC
  Administered 2016-05-02: 07:00:00 via INTRAVENOUS
  Administered 2016-05-02: 1000 mL via INTRAVENOUS

## 2016-05-02 MED ORDER — NEOMYCIN-POLYMYXIN-DEXAMETH 3.5-10000-0.1 OP SUSP
OPHTHALMIC | Status: DC | PRN
  Administered 2016-05-02: 2 [drp] via OPHTHALMIC

## 2016-05-02 MED ORDER — LIDOCAINE HCL (PF) 1 % IJ SOLN
INTRAMUSCULAR | Status: DC | PRN
  Administered 2016-05-02: .5 mL

## 2016-05-02 MED ORDER — EPINEPHRINE PF 1 MG/ML IJ SOLN
INTRAMUSCULAR | Status: AC
  Filled 2016-05-02: qty 1

## 2016-05-02 MED ORDER — CYCLOPENTOLATE-PHENYLEPHRINE 0.2-1 % OP SOLN
1.0000 [drp] | OPHTHALMIC | Status: AC
  Administered 2016-05-02 (×3): 1 [drp] via OPHTHALMIC

## 2016-05-02 SURGICAL SUPPLY — 11 items
CLOTH BEACON ORANGE TIMEOUT ST (SAFETY) ×1 IMPLANT
EYE SHIELD UNIVERSAL CLEAR (GAUZE/BANDAGES/DRESSINGS) ×1 IMPLANT
GLOVE BIOGEL PI IND STRL 7.0 (GLOVE) IMPLANT
GLOVE BIOGEL PI INDICATOR 7.0 (GLOVE) ×1
GLOVE EXAM NITRILE MD LF STRL (GLOVE) ×1 IMPLANT
LENS ALC ACRYL/TECN (Ophthalmic Related) ×1 IMPLANT
PAD ARMBOARD 7.5X6 YLW CONV (MISCELLANEOUS) ×1 IMPLANT
SYRINGE LUER LOK 1CC (MISCELLANEOUS) ×1 IMPLANT
TAPE SURG TRANSPORE 1 IN (GAUZE/BANDAGES/DRESSINGS) IMPLANT
TAPE SURGICAL TRANSPORE 1 IN (GAUZE/BANDAGES/DRESSINGS) ×1
WATER STERILE IRR 250ML POUR (IV SOLUTION) ×1 IMPLANT

## 2016-05-02 NOTE — Anesthesia Preprocedure Evaluation (Signed)
Anesthesia Evaluation  Patient identified by MRN, date of birth, ID band Patient awake    Reviewed: Allergy & Precautions, NPO status , Patient's Chart, lab work & pertinent test results  Airway Mallampati: I  TM Distance: >3 FB     Dental  (+) Teeth Intact   Pulmonary neg pulmonary ROS,    breath sounds clear to auscultation       Cardiovascular hypertension, Pt. on medications  Rhythm:Regular Rate:Normal     Neuro/Psych    GI/Hepatic negative GI ROS,   Endo/Other    Renal/GU      Musculoskeletal   Abdominal   Peds  Hematology   Anesthesia Other Findings   Reproductive/Obstetrics                             Anesthesia Physical Anesthesia Plan  ASA: II  Anesthesia Plan: MAC   Post-op Pain Management:    Induction: Intravenous  Airway Management Planned: Nasal Cannula  Additional Equipment:   Intra-op Plan:   Post-operative Plan:   Informed Consent: I have reviewed the patients History and Physical, chart, labs and discussed the procedure including the risks, benefits and alternatives for the proposed anesthesia with the patient or authorized representative who has indicated his/her understanding and acceptance.     Plan Discussed with:   Anesthesia Plan Comments:         Anesthesia Quick Evaluation

## 2016-05-02 NOTE — Anesthesia Postprocedure Evaluation (Signed)
Anesthesia Post Note  Patient: Teresa Arellano  Procedure(s) Performed: Procedure(s) (LRB): CATARACT EXTRACTION PHACO AND INTRAOCULAR LENS PLACEMENT (IOC) (Right)  Anesthesia Type: MAC     Last Vitals:  Vitals:   05/02/16 0710 05/02/16 0715  BP: 109/67 106/68  Pulse:    Resp: 14 13  Temp:      Last Pain:  Vitals:   05/02/16 0635  TempSrc: Oral                 Forest Redwine

## 2016-05-02 NOTE — H&P (Signed)
I have reviewed the H&P, the patient was re-examined, and I have identified no interval changes in medical condition and plan of care since the history and physical of record  

## 2016-05-02 NOTE — Discharge Instructions (Signed)
Moderate Conscious Sedation, Adult, Care After °These instructions provide you with information about caring for yourself after your procedure. Your health care provider may also give you more specific instructions. Your treatment has been planned according to current medical practices, but problems sometimes occur. Call your health care provider if you have any problems or questions after your procedure. °What can I expect after the procedure? °After your procedure, it is common: °· To feel sleepy for several hours. °· To feel clumsy and have poor balance for several hours. °· To have poor judgment for several hours. °· To vomit if you eat too soon. °Follow these instructions at home: °For at least 24 hours after the procedure:  ° °· Do not: °¨ Participate in activities where you could fall or become injured. °¨ Drive. °¨ Use heavy machinery. °¨ Drink alcohol. °¨ Take sleeping pills or medicines that cause drowsiness. °¨ Make important decisions or sign legal documents. °¨ Take care of children on your own. °· Rest. °Eating and drinking  °· Follow the diet recommended by your health care provider. °· If you vomit: °¨ Drink water, juice, or soup when you can drink without vomiting. °¨ Make sure you have little or no nausea before eating solid foods. °General instructions  °· Have a responsible adult stay with you until you are awake and alert. °· Take over-the-counter and prescription medicines only as told by your health care provider. °· If you smoke, do not smoke without supervision. °· Keep all follow-up visits as told by your health care provider. This is important. °Contact a health care provider if: °· You keep feeling nauseous or you keep vomiting. °· You feel light-headed. °· You develop a rash. °· You have a fever. °Get help right away if: °· You have trouble breathing. °This information is not intended to replace advice given to you by your health care provider. Make sure you discuss any questions you  have with your health care provider. °Document Released: 01/16/2013 Document Revised: 08/31/2015 Document Reviewed: 07/18/2015 °Elsevier Interactive Patient Education © 2017 Else[employer] Inc[employer] ° °

## 2016-05-02 NOTE — Op Note (Signed)
Date of Admission: 05/02/2016  Date of Surgery: 05/02/2016  Pre-Op Dx: Cataract Right  Eye  Post-Op Dx: Senile Combined Cataract  Right  Eye,  Dx Code X48.016  Surgeon: Tonny Branch, M.D.  Assistants: None  Anesthesia: Topical with MAC  Indications: Painless, progressive loss of vision with compromise of daily activities.  Surgery: Cataract Extraction with Intraocular lens Implant Right Eye  Discription: The patient had dilating drops and viscous lidocaine placed into the Right eye in the pre-op holding area. After transfer to the operating room, a time out was performed. The patient was then prepped and draped. Beginning with a 45 degree blade a paracentesis port was made at the surgeon's 2 o'clock position. The anterior chamber was then filled with 1% non-preserved lidocaine. This was followed by filling the anterior chamber with Provisc.  A 2.15m keratome blade was used to make a clear corneal incision at the temporal limbus.  A bent cystatome needle was used to create a continuous tear capsulotomy. Hydrodissection was performed with balanced salt solution on a Fine canula. The lens nucleus was then removed using the phacoemulsification handpiece. Residual cortex was removed with the I&A handpiece. The anterior chamber and capsular bag were refilled with Provisc. A posterior chamber intraocular lens was placed into the capsular bag with it's injector. The implant was positioned with the Kuglan hook. The Provisc was then removed from the anterior chamber and capsular bag with the I&A handpiece. Stromal hydration of the main incision and paracentesis port was performed with BSS on a Fine canula. The wounds were tested for leak which was negative. The patient tolerated the procedure well. There were no operative complications. The patient was then transferred to the recovery room in stable condition.  Complications: None  Specimen: None  EBL: None  Prosthetic device: Abbott Technis, PCB00,  power 19.0, SN [id number]

## 2016-05-02 NOTE — Transfer of Care (Signed)
Immediate Anesthesia Transfer of Care Note  Patient: Teresa Arellano  Procedure(s) Performed: Procedure(s) with comments: CATARACT EXTRACTION PHACO AND INTRAOCULAR LENS PLACEMENT ([facility]) (Right) - CDE: 5.35  Patient Location: PACU  Anesthesia Type:MAC  Level of Consciousness: awake, alert , oriented and patient cooperative  Airway & Oxygen Therapy: Patient Spontanous Breathing  Post-op Assessment: Report given to RN and Post -op Vital signs reviewed and stable  Post vital signs: Reviewed and stable  Last Vitals:  Vitals:   05/02/16 0710 05/02/16 0715  BP: 109/67 106/68  Pulse:    Resp: 14 13  Temp:      Last Pain:  Vitals:   05/02/16 0635  TempSrc: Oral      Patients Stated Pain Goal: 7 (89/16/94 5038)  Complications: No apparent anesthesia complications

## 2016-05-03 ENCOUNTER — Encounter (HOSPITAL_COMMUNITY): Payer: Self-pay | Admitting: Ophthalmology

## 2016-05-03 NOTE — Addendum Note (Signed)
Addendum  created 05/03/16 0736 by Vista Deck, CRNA   Anesthesia Intra Flowsheets edited

## 2016-05-16 ENCOUNTER — Encounter (HOSPITAL_COMMUNITY): Payer: Self-pay

## 2016-05-16 ENCOUNTER — Encounter (HOSPITAL_COMMUNITY)
Admission: RE | Admit: 2016-05-16 | Discharge: 2016-05-16 | Disposition: A | Discharge status: Home / Self Care | Payer: Medicare HMO | Source: Ambulatory Visit | Attending: Ophthalmology | Admitting: Ophthalmology

## 2016-05-16 DIAGNOSIS — H25812 Combined forms of age-related cataract, left eye: Secondary | ICD-10-CM | POA: Diagnosis not present

## 2016-05-17 ENCOUNTER — Encounter: Payer: Self-pay | Admitting: Family Medicine

## 2016-05-17 ENCOUNTER — Ambulatory Visit (INDEPENDENT_AMBULATORY_CARE_PROVIDER_SITE_OTHER): Payer: Medicare HMO | Admitting: Family Medicine

## 2016-05-17 VITALS — BP 110/82 | HR 75 | Resp 15 | Ht 67.0 in | Wt 187.0 lb

## 2016-05-17 DIAGNOSIS — I10 Essential (primary) hypertension: Secondary | ICD-10-CM

## 2016-05-17 DIAGNOSIS — E663 Overweight: Secondary | ICD-10-CM | POA: Diagnosis not present

## 2016-05-17 DIAGNOSIS — Z23 Encounter for immunization: Secondary | ICD-10-CM | POA: Diagnosis not present

## 2016-05-17 DIAGNOSIS — Z13820 Encounter for screening for osteoporosis: Secondary | ICD-10-CM

## 2016-05-17 DIAGNOSIS — E559 Vitamin D deficiency, unspecified: Secondary | ICD-10-CM

## 2016-05-17 DIAGNOSIS — E876 Hypokalemia: Secondary | ICD-10-CM

## 2016-05-17 MED ORDER — POTASSIUM CHLORIDE ER 10 MEQ PO TBCR: 10.0000 meq | EXTENDED_RELEASE_TABLET | Freq: Every day | ORAL | 1 refills | Status: DC

## 2016-05-17 MED ORDER — PNEUMOCOCCAL 13-VAL CONJ VACC IM SUSP: 0.5000 mL | INTRAMUSCULAR | Status: DC

## 2016-05-17 NOTE — Patient Instructions (Addendum)
Welcome to medicare in 5.5 month, call if you need me  Before  Prevnar today  You are referred for bone density test  New is potassium 10 meq one daily and a list of potassium rich foods is given  Start once daily OTC vit D3 1000 IU, is good for bones and muscle, also commit to exercise for 30 mins or more each day at least 5 days per week  List of calcium rich food is being given     Plas start aspirin coated 81 mg once daily to reduce risk of strokes   Heart-Healthy Eating Plan Introduction Heart-healthy meal planning includes:  Limiting unhealthy fats.  Increasing healthy fats.  Making other small dietary changes. You may need to talk with your doctor or a diet specialist (dietitian) to create an eating plan that is right for you. What types of fat should I choose?  Choose healthy fats. These include olive oil and canola oil, flaxseeds, walnuts, almonds, and seeds.  Eat more omega-3 fats. These include salmon, mackerel, sardines, tuna, flaxseed oil, and ground flaxseeds. Try to eat fish at least twice each week.  Limit saturated fats.  Saturated fats are often found in animal products, such as meats, butter, and cream.  Plant sources of saturated fats include palm oil, palm kernel oil, and coconut oil.  Avoid foods with partially hydrogenated oils in them. These include stick margarine, some tub margarines, cookies, crackers, and other baked goods. These contain trans fats. What general guidelines do I need to follow?  Check food labels carefully. Identify foods with trans fats or high amounts of saturated fat.  Fill one half of your plate with vegetables and [place] salads. Eat 4-5 servings of vegetables per day. A serving of vegetables is:  1 cup of raw leafy vegetables.   cup of raw or cooked cut-up vegetables.   cup of vegetable juice.  Fill one fourth of your plate with whole grains. Look for the word "whole" as the first word in the ingredient  list.  Fill one fourth of your plate with lean protein foods.  Eat 4-5 servings of fruit per day. A serving of fruit is:  One medium whole fruit.   cup of dried fruit.   cup of fresh, frozen, or canned fruit.   cup of 100% fruit juice.  Eat more foods that contain soluble fiber. These include apples, broccoli, carrots, beans, peas, and barley. Try to get 20-30 g of fiber per day.  Eat more home-cooked food. Eat less restaurant, buffet, and fast food.  Limit or avoid alcohol.  Limit foods high in starch and sugar.  Avoid fried foods.  Avoid frying your food. Try baking, boiling, grilling, or broiling it instead. You can also reduce fat by:  Removing the skin from poultry.  Removing all visible fats from meats.  Skimming the fat off of stews, soups, and gravies before serving them.  Steaming vegetables in water or broth.  Lose weight if you are overweight.  Eat 4-5 servings of nuts, legumes, and seeds per week:  One serving of dried beans or legumes equals  cup after being cooked.  One serving of nuts equals 1 ounces.  One serving of seeds equals  ounce or one tablespoon.  You may need to keep track of how much salt or sodium you eat. This is especially true if you have high blood pressure. Talk with your doctor or dietitian to get more information. What foods can I eat? Grains  Breads,  including [place], white, pita, wheat, raisin, rye, oatmeal, and [place]. Tortillas that are neither fried nor made with lard or trans fat. Low-fat rolls, including hotdog and hamburger buns and English muffins. Biscuits. Muffins. Waffles. Pancakes. Light popcorn. Whole-grain cereals. Flatbread. Melba toast. Pretzels. Breadsticks. Rusks. Low-fat snacks. Low-fat crackers, including oyster, saltine, matzo, graham, animal, and rye. Rice and pasta, including brown rice and pastas that are made with whole wheat. Vegetables  All vegetables. Fruits  All fruits, but limit coconut. Meats  and Other Protein Sources  Lean, well-trimmed beef, veal, pork, and lamb. Chicken and [place] without skin. All fish and shellfish. Wild duck, rabbit, pheasant, and venison. Egg whites or low-cholesterol egg substitutes. Dried beans, peas, lentils, and tofu. Seeds and most nuts. Dairy  Low-fat or nonfat cheeses, including ricotta, string, and mozzarella. Skim or 1% milk that is liquid, powdered, or evaporated. Buttermilk that is made with low-fat milk. Nonfat or low-fat yogurt. Beverages  Mineral water. Diet carbonated beverages. Sweets and Desserts  Sherbets and fruit ices. Honey, jam, marmalade, jelly, and syrups. Meringues and gelatins. Pure sugar candy, such as hard candy, jelly beans, gumdrops, mints, marshmallows, and small amounts of dark chocolate. [employer] Eat all sweets and desserts in moderation. Fats and Oils  Nonhydrogenated (trans-free) margarines. Vegetable oils, including soybean, sesame, sunflower, olive, peanut, safflower, corn, canola, and cottonseed. Salad dressings or mayonnaise made with a vegetable oil. Limit added fats and oils that you use for cooking, baking, salads, and as spreads. Other  Cocoa powder. Coffee and tea. All seasonings and condiments. The items listed above may not be a complete list of recommended foods or beverages. Contact your dietitian for more options.  What foods are not recommended? Grains  Breads that are made with saturated or trans fats, oils, or whole milk. Croissants. Butter rolls. Cheese breads. Sweet rolls. Donuts. Buttered popcorn. Chow mein noodles. High-fat crackers, such as cheese or butter crackers. Meats and Other Protein Sources  Fatty meats, such as hotdogs, short ribs, sausage, spareribs, bacon, rib eye roast or steak, and mutton. High-fat deli meats, such as salami and bologna. Caviar. Domestic duck and goose. Organ meats, such as kidney, liver, sweetbreads, and heart. Dairy  Cream, sour cream, cream cheese, and creamed  cottage cheese. Whole-milk cheeses, including blue (bleu), Monterey Jack, [place], [place], American, [place], Swiss, cheddar, [place], and [place]. Whole or 2% milk that is liquid, evaporated, or condensed. Whole buttermilk. Cream sauce or high-fat cheese sauce. Yogurt that is made from whole milk. Beverages  Regular sodas and juice drinks with added sugar. Sweets and Desserts  Frosting. Pudding. Cookies. Cakes other than angel food cake. Candy that has milk chocolate or white chocolate, hydrogenated fat, butter, coconut, or unknown ingredients. Buttered syrups. Full-fat ice cream or ice cream drinks. Fats and Oils  Gravy that has suet, meat fat, or shortening. Cocoa butter, hydrogenated oils, palm oil, coconut oil, palm kernel oil. These can often be found in baked products, candy, fried foods, nondairy creamers, and whipped toppings. Solid fats and shortenings, including bacon fat, salt pork, lard, and butter. Nondairy cream substitutes, such as coffee creamers and sour cream substitutes. Salad dressings that are made of unknown oils, cheese, or sour cream. The items listed above may not be a complete list of foods and beverages to avoid. Contact your dietitian for more information.  This information is not intended to replace advice given to you by your health care provider. Make sure you discuss any questions you have with your health care  provider. Document Released: 09/27/2011 Document Revised: 09/03/2015 Document Reviewed: 09/19/2013  2017 Elsevier

## 2016-05-21 ENCOUNTER — Encounter: Payer: Self-pay | Admitting: Family Medicine

## 2016-05-21 DIAGNOSIS — E559 Vitamin D deficiency, unspecified: Secondary | ICD-10-CM | POA: Insufficient documentation

## 2016-05-21 DIAGNOSIS — E876 Hypokalemia: Secondary | ICD-10-CM | POA: Insufficient documentation

## 2016-05-21 NOTE — Assessment & Plan Note (Signed)
Needs to commit to supplement, untreated currnetly

## 2016-05-21 NOTE — Assessment & Plan Note (Addendum)
Potassium rich foods provided and start daily low dose supplement

## 2016-05-21 NOTE — Assessment & Plan Note (Signed)
Controlled, no change in medication DASH diet and commitment to daily physical activity for a minimum of 30 minutes discussed and encouraged, as a part of hypertension management. The importance of attaining a healthy weight is also discussed.  BP/Weight 05/17/2016 05/02/2016 04/27/2016 04/06/2016 11/26/2015 05/07/2015 [id number]  Systolic BP A999333 [id number] 0000000 [id number] 0000000 [id number]  Diastolic BP 82 89 93 65 82 84 76  Wt. (Lbs) 187 183 183 179 182 206 209  BMI 29.29 28.66 28.66 28.04 29.38 33.27 33.75

## 2016-05-21 NOTE — Assessment & Plan Note (Signed)
Deteriorated. Patient re-educated about  the importance of commitment to a  minimum of 150 minutes of exercise per week.  The importance of healthy food choices with portion control discussed. Encouraged to start a food diary, count calories and to consider  joining a support group. Sample diet sheets offered. Goals set by the patient for the next several months.   Weight /BMI 05/17/2016 05/02/2016 04/27/2016  WEIGHT 187 lb 183 lb 183 lb  HEIGHT 5\' 7"  5\' 7"  5\' 7"   BMI 29.29 kg/m2 28.66 kg/m2 28.66 kg/m2

## 2016-05-21 NOTE — Progress Notes (Signed)
   Teresa Arellano     MRN: [id number]      DOB: [birth year]   HPI Teresa Arellano is here for follow up and re-evaluation of chronic medical conditions, medication management and review of any available recent lab and radiology data.  Preventive health is updated, specifically  Cancer screening and Immunization.   Questions or concerns regarding consultations or procedures which the PT has had in the interim are  addressed. The PT denies any adverse reactions to current medications since the last visit.  There are no new concerns.  There are no specific complaints   ROS Denies recent fever or chills. Denies sinus pressure, nasal congestion, ear pain or sore throat. Denies chest congestion, productive cough or wheezing. Denies chest pains, palpitations and leg swelling Denies abdominal pain, nausea, vomiting,diarrhea or constipation.   Denies dysuria, frequency, hesitancy or incontinence. Denies joint pain, swelling and limitation in mobility. Denies headaches, seizures, numbness, or tingling. Denies depression, anxiety or insomnia. Denies skin break down or rash.   PE  BP 110/82   Pulse 75   Resp 15   Ht 5\' 7"  (1.702 m)   Wt 187 lb (84.8 kg)   SpO2 99%   BMI 29.29 kg/m   Patient alert and oriented and in no cardiopulmonary distress.  HEENT: No facial asymmetry, EOMI,   oropharynx pink and moist.  Neck supple no JVD, no mass.  Chest: Clear to auscultation bilaterally.  CVS: S1, S2 no murmurs, no S3.Regular rate.  ABD: Soft non tender.   Ext: No edema  MS: Adequate ROM spine, shoulders, hips and knees.  Skin: Intact, no ulcerations or rash noted.  Psych: Good eye contact, normal affect. Memory intact not anxious or depressed appearing.  CNS: CN 2-12 intact, power,  normal throughout.no focal deficits noted.   Assessment & Plan  Essential hypertension, benign Controlled, no change in medication DASH diet and commitment to daily physical activity for a minimum of 30  minutes discussed and encouraged, as a part of hypertension management. The importance of attaining a healthy weight is also discussed.  BP/Weight 05/17/2016 05/02/2016 04/27/2016 04/06/2016 11/26/2015 05/07/2015 123456  Systolic BP A999333 Q000111Q 0000000 [id number] 0000000 123456 [id number]  Diastolic BP 82 89 93 65 82 84 76  Wt. (Lbs) 187 183 183 179 182 206 209  BMI 29.29 28.66 28.66 28.04 29.38 33.27 33.75       Need for vaccination with 13-polyvalent pneumococcal conjugate vaccine After obtaining informed consent, the vaccine is  administered by LPN.   Overweight (BMI 25.0-29.9) Deteriorated. Patient re-educated about  the importance of commitment to a  minimum of 150 minutes of exercise per week.  The importance of healthy food choices with portion control discussed. Encouraged to start a food diary, count calories and to consider  joining a support group. Sample diet sheets offered. Goals set by the patient for the next several months.   Weight /BMI 05/17/2016 05/02/2016 04/27/2016  WEIGHT 187 lb 183 lb 183 lb  HEIGHT 5\' 7"  5\' 7"  5\' 7"   BMI 29.29 kg/m2 28.66 kg/m2 28.66 kg/m2      Vitamin D deficiency Needs to commit to supplement, untreated currnetly  Hypokalemia Potassium rich foods provided and start daily low dose supplement

## 2016-05-21 NOTE — Assessment & Plan Note (Signed)
After obtaining informed consent, the vaccine is  administered by LPN.  

## 2016-05-23 ENCOUNTER — Ambulatory Visit (HOSPITAL_COMMUNITY)
Admission: RE | Admit: 2016-05-23 | Discharge: 2016-05-23 | Disposition: A | Discharge status: Home / Self Care | Payer: Medicare HMO | Source: Ambulatory Visit | Attending: Ophthalmology | Admitting: Ophthalmology

## 2016-05-23 ENCOUNTER — Encounter (HOSPITAL_COMMUNITY): Payer: Self-pay | Admitting: Ophthalmology

## 2016-05-23 ENCOUNTER — Ambulatory Visit (HOSPITAL_COMMUNITY): Payer: Medicare HMO | Admitting: Anesthesiology

## 2016-05-23 ENCOUNTER — Encounter (HOSPITAL_COMMUNITY)
Admission: RE | Disposition: A | Discharge status: Home / Self Care | Payer: Self-pay | Source: Ambulatory Visit | Attending: Ophthalmology

## 2016-05-23 DIAGNOSIS — H25812 Combined forms of age-related cataract, left eye: Secondary | ICD-10-CM | POA: Insufficient documentation

## 2016-05-23 DIAGNOSIS — Z79899 Other long term (current) drug therapy: Secondary | ICD-10-CM | POA: Diagnosis not present

## 2016-05-23 DIAGNOSIS — I10 Essential (primary) hypertension: Secondary | ICD-10-CM | POA: Insufficient documentation

## 2016-05-23 DIAGNOSIS — H25811 Combined forms of age-related cataract, right eye: Secondary | ICD-10-CM | POA: Diagnosis not present

## 2016-05-23 DIAGNOSIS — H269 Unspecified cataract: Secondary | ICD-10-CM | POA: Diagnosis not present

## 2016-05-23 HISTORY — PX: CATARACT EXTRACTION W/PHACO: SHX586

## 2016-05-23 SURGERY — PHACOEMULSIFICATION, CATARACT, WITH IOL INSERTION
Anesthesia: Monitor Anesthesia Care | Site: Eye | Laterality: Left

## 2016-05-23 MED ORDER — POVIDONE-IODINE 5 % OP SOLN
OPHTHALMIC | Status: DC | PRN
  Administered 2016-05-23: 1 via OPHTHALMIC

## 2016-05-23 MED ORDER — CYCLOPENTOLATE-PHENYLEPHRINE 0.2-1 % OP SOLN
1.0000 [drp] | OPHTHALMIC | Status: AC
  Administered 2016-05-23 (×3): 1 [drp] via OPHTHALMIC

## 2016-05-23 MED ORDER — TETRACAINE HCL 0.5 % OP SOLN
1.0000 [drp] | OPHTHALMIC | Status: AC
  Administered 2016-05-23 (×3): 1 [drp] via OPHTHALMIC

## 2016-05-23 MED ORDER — MIDAZOLAM HCL 2 MG/2ML IJ SOLN
INTRAMUSCULAR | Status: AC
  Filled 2016-05-23: qty 2

## 2016-05-23 MED ORDER — LIDOCAINE 3.5 % OP GEL OPTIME - NO CHARGE
OPHTHALMIC | Status: DC | PRN
  Administered 2016-05-23: 1 [drp] via OPHTHALMIC

## 2016-05-23 MED ORDER — FENTANYL CITRATE (PF) 100 MCG/2ML IJ SOLN
25.0000 ug | INTRAMUSCULAR | Status: AC
  Administered 2016-05-23 (×2): 25 ug via INTRAVENOUS

## 2016-05-23 MED ORDER — EPINEPHRINE PF 1 MG/ML IJ SOLN
INTRAMUSCULAR | Status: AC
  Filled 2016-05-23: qty 1

## 2016-05-23 MED ORDER — NEOMYCIN-POLYMYXIN-DEXAMETH 3.5-10000-0.1 OP SUSP
OPHTHALMIC | Status: DC | PRN
  Administered 2016-05-23: 2 [drp] via OPHTHALMIC

## 2016-05-23 MED ORDER — MIDAZOLAM HCL 2 MG/2ML IJ SOLN
1.0000 mg | INTRAMUSCULAR | Status: AC
  Administered 2016-05-23: 2 mg via INTRAVENOUS

## 2016-05-23 MED ORDER — LACTATED RINGERS IV SOLN
INTRAVENOUS | Status: DC
  Administered 2016-05-23: 75 mL/h via INTRAVENOUS

## 2016-05-23 MED ORDER — PHENYLEPHRINE HCL 2.5 % OP SOLN
1.0000 [drp] | OPHTHALMIC | Status: AC
  Administered 2016-05-23 (×3): 1 [drp] via OPHTHALMIC

## 2016-05-23 MED ORDER — LIDOCAINE HCL 3.5 % OP GEL
1.0000 "application " | Freq: Once | OPHTHALMIC | Status: AC
  Administered 2016-05-23: 1 via OPHTHALMIC

## 2016-05-23 MED ORDER — LIDOCAINE HCL (PF) 1 % IJ SOLN
INTRAMUSCULAR | Status: DC | PRN
  Administered 2016-05-23: .9 mL

## 2016-05-23 MED ORDER — PROVISC 10 MG/ML IO SOLN
INTRAOCULAR | Status: DC | PRN
  Administered 2016-05-23: 0.85 mL via INTRAOCULAR

## 2016-05-23 MED ORDER — EPINEPHRINE PF 1 MG/ML IJ SOLN
INTRAOCULAR | Status: DC | PRN
  Administered 2016-05-23: 500 mL

## 2016-05-23 MED ORDER — BSS IO SOLN
INTRAOCULAR | Status: DC | PRN
  Administered 2016-05-23: 15 mL via INTRAOCULAR

## 2016-05-23 MED ORDER — FENTANYL CITRATE (PF) 100 MCG/2ML IJ SOLN
INTRAMUSCULAR | Status: AC
  Filled 2016-05-23: qty 2

## 2016-05-23 SURGICAL SUPPLY — 23 items
CAPSULAR TENSION RING-AMO (OPHTHALMIC RELATED) IMPLANT
CLOTH BEACON ORANGE TIMEOUT ST (SAFETY) ×1 IMPLANT
EYE SHIELD UNIVERSAL CLEAR (GAUZE/BANDAGES/DRESSINGS) ×1 IMPLANT
GLOVE BIOGEL PI IND STRL 6.5 (GLOVE) IMPLANT
GLOVE BIOGEL PI IND STRL 7.0 (GLOVE) IMPLANT
GLOVE BIOGEL PI INDICATOR 6.5 (GLOVE)
GLOVE BIOGEL PI INDICATOR 7.0 (GLOVE) ×2
GLOVE EXAM NITRILE LRG STRL (GLOVE) IMPLANT
GLOVE EXAM NITRILE MD LF STRL (GLOVE) IMPLANT
KIT VITRECTOMY (OPHTHALMIC RELATED) IMPLANT
PAD ARMBOARD 7.5X6 YLW CONV (MISCELLANEOUS) ×1 IMPLANT
PROC W NO LENS (INTRAOCULAR LENS)
PROC W SPEC LENS (INTRAOCULAR LENS)
PROCESS W NO LENS (INTRAOCULAR LENS) IMPLANT
PROCESS W SPEC LENS (INTRAOCULAR LENS) IMPLANT
RETRACTOR IRIS SIGHTPATH (OPHTHALMIC RELATED) IMPLANT
RING MALYGIN (MISCELLANEOUS) IMPLANT
SIGHTPATH CAT PROC W REG LENS (Ophthalmic Related) ×2 IMPLANT
SYRINGE LUER LOK 1CC (MISCELLANEOUS) ×1 IMPLANT
TAPE SURG TRANSPORE 1 IN (GAUZE/BANDAGES/DRESSINGS) IMPLANT
TAPE SURGICAL TRANSPORE 1 IN (GAUZE/BANDAGES/DRESSINGS) ×1
VISCOELASTIC ADDITIONAL (OPHTHALMIC RELATED) IMPLANT
WATER STERILE IRR 250ML POUR (IV SOLUTION) ×1 IMPLANT

## 2016-05-23 NOTE — H&P (View-Only) (Signed)
I have reviewed the H&P, the patient was re-examined, and I have identified no interval changes in medical condition and plan of care since the history and physical of record  

## 2016-05-23 NOTE — Op Note (Signed)
Date of Admission: 05/23/2016  Date of Surgery: 05/23/2016  Pre-Op Dx: Cataract Left  Eye  Post-Op Dx: Senile Combined Cataract  Left  Eye,  Dx Code J57.017  Surgeon: Tonny Branch, M.D.  Assistants: None  Anesthesia: Topical with MAC  Indications: Painless, progressive loss of vision with compromise of daily activities.  Surgery: Cataract Extraction with Intraocular lens Implant Left Eye  Discription: The patient had dilating drops and viscous lidocaine placed into the Left eye in the pre-op holding area. After transfer to the operating room, a time out was performed. The patient was then prepped and draped. Beginning with a 37 degree blade a paracentesis port was made at the surgeon's 2 o'clock position. The anterior chamber was then filled with 1% non-preserved lidocaine. This was followed by filling the anterior chamber with Provisc.  A 2.73m keratome blade was used to make a clear corneal incision at the temporal limbus.  A bent cystatome needle was used to create a continuous tear capsulotomy. Hydrodissection was performed with balanced salt solution on a Fine canula. The lens nucleus was then removed using the phacoemulsification handpiece. Residual cortex was removed with the I&A handpiece. The anterior chamber and capsular bag were refilled with Provisc. A posterior chamber intraocular lens was placed into the capsular bag with it's injector. The implant was positioned with the Kuglan hook. The Provisc was then removed from the anterior chamber and capsular bag with the I&A handpiece. Stromal hydration of the main incision and paracentesis port was performed with BSS on a Fine canula. The wounds were tested for leak which was negative. The patient tolerated the procedure well. There were no operative complications. The patient was then transferred to the recovery room in stable condition.  Complications: None  Specimen: None  EBL: None  Prosthetic device: Abbott Technis, PCB00, power  19.0, SN [id number]

## 2016-05-23 NOTE — Anesthesia Preprocedure Evaluation (Signed)
Anesthesia Evaluation  Patient identified by MRN, date of birth, ID band Patient awake    Reviewed: Allergy & Precautions, NPO status , Patient's Chart, lab work & pertinent test results  Airway Mallampati: I  TM Distance: >3 FB     Dental  (+) Teeth Intact   Pulmonary neg pulmonary ROS,    breath sounds clear to auscultation       Cardiovascular hypertension, Pt. on medications  Rhythm:Regular Rate:Normal     Neuro/Psych    GI/Hepatic negative GI ROS,   Endo/Other    Renal/GU      Musculoskeletal   Abdominal   Peds  Hematology   Anesthesia Other Findings   Reproductive/Obstetrics                             Anesthesia Physical Anesthesia Plan  ASA: II  Anesthesia Plan: MAC   Post-op Pain Management:    Induction: Intravenous  Airway Management Planned: Nasal Cannula  Additional Equipment:   Intra-op Plan:   Post-operative Plan:   Informed Consent: I have reviewed the patients History and Physical, chart, labs and discussed the procedure including the risks, benefits and alternatives for the proposed anesthesia with the patient or authorized representative who has indicated his/her understanding and acceptance.     Plan Discussed with:   Anesthesia Plan Comments:         Anesthesia Quick Evaluation

## 2016-05-23 NOTE — Anesthesia Postprocedure Evaluation (Signed)
Anesthesia Post Note  Patient: Cresenciano Genre  Procedure(s) Performed: Procedure(s) (LRB): CATARACT EXTRACTION PHACO AND INTRAOCULAR LENS PLACEMENT LEFT EYE (Left)  Patient location during evaluation: Short Stay Anesthesia Type: MAC Level of consciousness: awake and alert, oriented and patient cooperative Pain management: pain level controlled Vital Signs Assessment: post-procedure vital signs reviewed and stable Respiratory status: spontaneous breathing, nonlabored ventilation and respiratory function stable Cardiovascular status: blood pressure returned to baseline Postop Assessment: no signs of nausea or vomiting Anesthetic complications: no     Last Vitals:  Vitals:   05/23/16 0700 05/23/16 0714  BP: 118/80 109/73  Pulse:    Resp: (!) 27   Temp:      Last Pain:  Vitals:   05/23/16 0633  TempSrc: Oral  PainSc: 0-No pain                 Ayrabella Labombard J

## 2016-05-23 NOTE — Interval H&P Note (Signed)
History and Physical Interval Note:  05/23/2016 7:09 AM  Teresa Arellano  has presented today for surgery, with the diagnosis of nuclear cataract left eye  The various methods of treatment have been discussed with the patient and family. After consideration of risks, benefits and other options for treatment, the patient has consented to  Procedure(s) with comments: CATARACT EXTRACTION PHACO AND INTRAOCULAR LENS PLACEMENT ([place]) (Left) - left - pt knows to arrive at 6:15 as a surgical intervention .  The patient's history has been reviewed, patient examined, no change in status, stable for surgery.  I have reviewed the patient's chart and labs.  Questions were answered to the patient's satisfaction.     Koryn Charlot

## 2016-05-23 NOTE — Transfer of Care (Signed)
Immediate Anesthesia Transfer of Care Note  Patient: Teresa Arellano  Procedure(s) Performed: Procedure(s) with comments: CATARACT EXTRACTION PHACO AND INTRAOCULAR LENS PLACEMENT LEFT EYE (Left) - CDE:  5.45  Patient Location: Short Stay  Anesthesia Type:MAC  Level of Consciousness: awake, alert  and patient cooperative  Airway & Oxygen Therapy: Patient Spontanous Breathing  Post-op Assessment: Report given to RN, Post -op Vital signs reviewed and stable and Patient moving all extremities  Post vital signs: Reviewed and stable  Last Vitals:  Vitals:   05/23/16 0700 05/23/16 0714  BP: 118/80 109/73  Pulse:    Resp: (!) 27   Temp:      Last Pain:  Vitals:   05/23/16 0633  TempSrc: Oral  PainSc: 0-No pain         Complications: No apparent anesthesia complications

## 2016-05-24 ENCOUNTER — Encounter (HOSPITAL_COMMUNITY): Payer: Self-pay | Admitting: Ophthalmology

## 2016-05-26 ENCOUNTER — Other Ambulatory Visit: Payer: Self-pay | Admitting: Family Medicine

## 2016-07-13 DIAGNOSIS — Z961 Presence of intraocular lens: Secondary | ICD-10-CM | POA: Diagnosis not present

## 2016-07-13 DIAGNOSIS — H52223 Regular astigmatism, bilateral: Secondary | ICD-10-CM | POA: Diagnosis not present

## 2016-08-10 ENCOUNTER — Ambulatory Visit (INDEPENDENT_AMBULATORY_CARE_PROVIDER_SITE_OTHER): Payer: Medicare HMO | Admitting: Family Medicine

## 2016-08-10 ENCOUNTER — Telehealth: Payer: Self-pay | Admitting: Family Medicine

## 2016-08-10 ENCOUNTER — Encounter: Payer: Self-pay | Admitting: Family Medicine

## 2016-08-10 VITALS — BP 120/84 | HR 78 | Resp 16 | Ht 67.0 in | Wt 194.0 lb

## 2016-08-10 DIAGNOSIS — I10 Essential (primary) hypertension: Secondary | ICD-10-CM | POA: Diagnosis not present

## 2016-08-10 DIAGNOSIS — L03116 Cellulitis of left lower limb: Secondary | ICD-10-CM | POA: Diagnosis not present

## 2016-08-10 MED ORDER — CEPHALEXIN 500 MG PO CAPS: 500.0000 mg | ORAL_CAPSULE | Freq: Two times a day (BID) | ORAL | 0 refills | Status: DC

## 2016-08-10 NOTE — Assessment & Plan Note (Signed)
4 day h/o acute pain , redness and swelling of left leg , no trauma, area approx 3 in in widest diameter

## 2016-08-10 NOTE — Patient Instructions (Addendum)
f/u as before , call if you need me sooner  You are treated for cellulitis of left leg  1 week course of keflex ( antibiotic) is sent to your pharmacy

## 2016-08-10 NOTE — Telephone Encounter (Signed)
Patient standing in our office now w/complaint R leg swollen, painful, and warm to the touch.  She states she is her on her lunch hour and hoping to be seen now.  Ok to work in per [employer]

## 2016-08-12 ENCOUNTER — Encounter: Payer: Self-pay | Admitting: Family Medicine

## 2016-08-12 NOTE — Assessment & Plan Note (Signed)
Controlled, no change in medication  

## 2016-08-12 NOTE — Progress Notes (Signed)
   Teresa Arellano     MRN: [id number]      DOB: [birth year]   HPI Ms. Veracruz is here with a 4 day h/o pain, redness and swelling of left leg, no known trauma, no drainage from the leg. No prior episode  ROS Denies recent fever or chills. Denies sinus pressure, nasal congestion, ear pain or sore throat. Denies chest congestion, productive cough or wheezing.   PE  BP 120/84   Pulse 78   Resp 16   Ht 5\' 7"  (1.702 m)   Wt 194 lb (88 kg)   SpO2 98%   BMI 30.38 kg/m   Patient alert and oriented and in no cardiopulmonary distress.  HEENT: No facial asymmetry, EOMI,   oropharynx pink and moist.  Neck supple no JVD, no mass.  Chest: Clear to auscultation bilaterally.  CVS: S1, S2 no murmurs, no S3.Regular rate.  ABD: Soft non tender.     MS: Adequate ROM spine, shoulders, hips and knees.  Skin: Intact, erythema, swelling and tenderness noted on ;lateral aspect of left leg diameter approx 3 cm, npo open skin or drainage noted  Psych: Good eye contact, normal affect. Memory intact not anxious or depressed appearing.  CNS: CN 2-12 intact, power,  normal throughout.no focal deficits noted.   Assessment & Plan  Cellulitis of left leg 4 day h/o acute pain , redness and swelling of left leg , no trauma, area approx 3 in in widest diameter  Essential hypertension, benign Controlled, no change in medication

## 2016-08-23 ENCOUNTER — Telehealth: Payer: Self-pay | Admitting: Family Medicine

## 2016-08-23 NOTE — Telephone Encounter (Signed)
Patient has an appt  W/Dr. Moshe Cipro on 17th of May to address pain in both legs, but today she is calling because of pain that started last night in her L ear (especially when coughing or yawning).  I offered work in appt with [employer] at 1:00 today but she cannot get off of work and would need something later.  She is asking what she can do for pain or if there is anything she can do until seen.  Please call and advise  [phone]

## 2016-08-23 NOTE — Telephone Encounter (Signed)
Recommend using tylenol or aleve one twice daily for pain

## 2016-08-23 NOTE — Telephone Encounter (Signed)
Patient aware.

## 2016-08-24 ENCOUNTER — Emergency Department (HOSPITAL_COMMUNITY)
Admission: EM | Admit: 2016-08-24 | Discharge: 2016-08-24 | Disposition: A | Discharge status: Home / Self Care | Payer: Medicare HMO | Attending: Emergency Medicine | Admitting: Emergency Medicine

## 2016-08-24 ENCOUNTER — Encounter (HOSPITAL_COMMUNITY): Payer: Self-pay | Admitting: *Deleted

## 2016-08-24 DIAGNOSIS — H60332 Swimmer's ear, left ear: Secondary | ICD-10-CM | POA: Diagnosis not present

## 2016-08-24 DIAGNOSIS — I10 Essential (primary) hypertension: Secondary | ICD-10-CM | POA: Diagnosis not present

## 2016-08-24 DIAGNOSIS — Z79899 Other long term (current) drug therapy: Secondary | ICD-10-CM | POA: Insufficient documentation

## 2016-08-24 DIAGNOSIS — H9202 Otalgia, left ear: Secondary | ICD-10-CM | POA: Diagnosis present

## 2016-08-24 MED ORDER — NEOMYCIN-POLYMYXIN-HC 1 % OT SOLN
4.0000 [drp] | Freq: Once | OTIC | Status: AC
Start: 2016-08-24 — End: 2016-08-24
  Administered 2016-08-24: 4 [drp] via OTIC
  Filled 2016-08-24: qty 10

## 2016-08-24 NOTE — ED Triage Notes (Signed)
Pt c/o left ear pain x 3 days; denies any nasal congestion or any other symptoms

## 2016-08-24 NOTE — Discharge Instructions (Signed)
Apply 4-5 drops of the cortisporin 4 times a day for 7 days.  Return here in 2 days to have the ear wick removed.

## 2016-08-25 ENCOUNTER — Encounter: Payer: Self-pay | Admitting: Family Medicine

## 2016-08-25 ENCOUNTER — Ambulatory Visit (INDEPENDENT_AMBULATORY_CARE_PROVIDER_SITE_OTHER): Payer: Medicare HMO | Admitting: Family Medicine

## 2016-08-25 VITALS — BP 118/82 | HR 72 | Ht 67.0 in | Wt 196.0 lb

## 2016-08-25 DIAGNOSIS — I10 Essential (primary) hypertension: Secondary | ICD-10-CM

## 2016-08-25 DIAGNOSIS — L03116 Cellulitis of left lower limb: Secondary | ICD-10-CM | POA: Diagnosis not present

## 2016-08-25 DIAGNOSIS — H60312 Diffuse otitis externa, left ear: Secondary | ICD-10-CM | POA: Diagnosis not present

## 2016-08-25 NOTE — Patient Instructions (Signed)
Keep welcome to medicare visit in July, call if you need me sooner  Please schedule bone density test at checkout   Leg is good  Use drop in left ear as directed this will heal the inflammation   It is important that you exercise regularly at least 30 minutes 5 times a week. If you develop chest pain, have severe difficulty breathing, or feel very tired, stop exercising immediately and seek medical attention

## 2016-08-25 NOTE — ED Provider Notes (Signed)
[facility] DEPT Provider Note   CSN: [id number] Arrival date & time: 08/24/16  1853     History   Chief Complaint Chief Complaint  Patient presents with  . Otalgia    HPI Teresa Arellano is a 66 y.o. female.  HPI  Teresa Arellano is a 66 y.o. female who presents to the Emergency Department complaining of pain and decreased hearing of the left ear.  Symptoms present for 3 days.  She describes a constant sharp pain to her ear.  She has been applying sweet oil without relief.  She is unsure if she got water in her ear recently.  She denies dizziness, tinnitus, fever, facial pain, sore throat or swelling    Past Medical History:  Diagnosis Date  . Hypertension 2014    Patient Active Problem List   Diagnosis Date Noted  . Cellulitis of left leg 08/10/2016  . Vitamin D deficiency 05/21/2016  . Hypokalemia 05/21/2016  . Overweight (BMI 25.0-29.9) 09/21/2013  . Essential hypertension, benign 09/10/2013    Past Surgical History:  Procedure Laterality Date  . ABDOMINAL HYSTERECTOMY  1982   partial, fibroids  . CATARACT EXTRACTION W/PHACO Right 05/02/2016   Procedure: CATARACT EXTRACTION PHACO AND INTRAOCULAR LENS PLACEMENT (IOC);  Surgeon: Tonny Branch, MD;  Location: [facility];  Service: Ophthalmology;  Laterality: Right;  CDE: 5.35  . CATARACT EXTRACTION W/PHACO Left 05/23/2016   Procedure: CATARACT EXTRACTION PHACO AND INTRAOCULAR LENS PLACEMENT LEFT EYE;  Surgeon: Tonny Branch, MD;  Location: [facility];  Service: Ophthalmology;  Laterality: Left;  CDE:  5.45  . CHOLECYSTECTOMY  1991 approx  . COLONOSCOPY N/A 04/06/2016   Procedure: COLONOSCOPY;  Surgeon: Danie Binder, MD;  Location: [facility] SU[facility];  Service: Endoscopy;  Laterality: N/A;  10:30 Am  . POLYPECTOMY  04/06/2016   Procedure: POLYPECTOMY;  Surgeon: Danie Binder, MD;  Location: [facility];  Service: Endoscopy;;  hepatic flexure polypectomy;  . TUBAL LIGATION  1979    OB History    No data available       Home  Medications    Prior to Admission medications   Medication Sig Start Date End Date Taking? Authorizing Provider  aspirin EC 81 MG tablet Take 81 mg by mouth daily.    [provider]  neomycin-polymyxin-hydrocortisone (CORTISPORIN) 3.5-10000-1 otic suspension Place into both ears 4 (four) times daily.    [provider]  potassium chloride (K-DUR) 10 MEQ tablet Take 1 tablet (10 mEq total) by mouth daily. 05/17/16   Fayrene Helper, MD  triamterene-hydrochlorothiazide [facility]) 37.5-25 MG tablet TAKE ONE TABLET BY MOUTH ONCE DAILY 05/27/16   Fayrene Helper, MD    Family History Family History  Problem Relation Age of Onset  . Heart disease Mother   . Diabetes Father   . Heart disease Brother   . Cancer Sister 52       breast   . Cancer Sister        stomach   . Heart disease Brother   . Hypertension Sister   . Hypertension Sister   . Diabetes Sister   . Diabetes Sister     Social History Social History  Substance Use Topics  . Smoking status: Never Smoker  . Smokeless tobacco: Never Used  . Alcohol use No     Allergies   Patient has no known allergies.   Review of Systems Review of Systems  Constitutional: Negative for activity change, appetite change, chills and fever.  HENT: Positive for ear  pain and hearing loss. Negative for congestion, facial swelling, rhinorrhea, sore throat and trouble swallowing.   Eyes: Negative for visual disturbance.  Respiratory: Negative for cough, shortness of breath, wheezing and stridor.   Gastrointestinal: Negative for nausea and vomiting.  Musculoskeletal: Negative for neck pain and neck stiffness.  Skin: Negative.  Negative for rash.  Neurological: Negative for dizziness, weakness, numbness and headaches.  Hematological: Negative for adenopathy.  Psychiatric/Behavioral: Negative for confusion.  All other systems reviewed and are negative.    Physical Exam Updated Vital Signs BP 112/71 (BP  Location: Left Arm)   Pulse 85   Temp 98.5 F (36.9 C) (Oral)   Resp 17   Ht 5\' 7"  (1.702 m)   Wt 194 lb (88 kg)   SpO2 98%   BMI 30.38 kg/m   Physical Exam  Constitutional: She is oriented to person, place, and time. She appears well-developed and well-nourished. No distress.  HENT:  Head: Atraumatic.  Right Ear: Tympanic membrane, external ear and ear canal normal.  Left Ear: External ear normal.  Mouth/Throat: Oropharynx is clear and moist.  Edema of the left ear canal.  Unable to visualize the TM or pass a speculum into the canal  Eyes: EOM are normal. Pupils are equal, round, and reactive to light.  Neck: Normal range of motion.  Cardiovascular: Normal rate, regular rhythm and normal heart sounds.   Pulmonary/Chest: Effort normal and breath sounds normal. No respiratory distress.  Musculoskeletal: Normal range of motion.  Lymphadenopathy:    She has no cervical adenopathy.  Neurological: She is alert and oriented to person, place, and time. No sensory deficit. Coordination normal.  Skin: Skin is warm.  Psychiatric: She has a normal mood and affect.  Nursing note and vitals reviewed.    ED Treatments / Results  Labs (all labs ordered are listed, but only abnormal results are displayed) Labs Reviewed - No data to display  EKG  EKG Interpretation None       Radiology No results found.  Procedures Procedures (including critical care time)  Medications Ordered in ED Medications  NEOMYCIN-POLYMYXIN-HYDROCORTISONE (CORTISPORIN) otic solution 4 drop (4 drops Left Ear Given 08/24/16 1948)     Initial Impression / Assessment and Plan / ED Course  I have reviewed the triage vital signs and the nursing notes.  Pertinent labs & imaging results that were available during my care of the patient were reviewed by me and considered in my medical decision making (see chart for details).     Procedure:  Ear wick applied to left ear canal using tissue forceps.  5  drops of cortisporin otic soln applied. Pt tolerated procedure well.   Remaining soln dispensed.  Pt agrees to return here in 2 days for recheck and wick removal.    Final Clinical Impressions(s) / ED Diagnoses   Final diagnoses:  Acute swimmer's ear of left side    New Prescriptions Discharge Medication List as of 08/24/2016  8:03 PM       Kem Parkinson, PA-C 08/25/16 Orma Flaming, MD 08/31/16 1753

## 2016-08-28 ENCOUNTER — Encounter: Payer: Self-pay | Admitting: Family Medicine

## 2016-08-28 DIAGNOSIS — H6092 Unspecified otitis externa, left ear: Secondary | ICD-10-CM | POA: Insufficient documentation

## 2016-08-28 NOTE — Assessment & Plan Note (Signed)
Pt to commit to topical antibiotic as prescribed

## 2016-08-28 NOTE — Assessment & Plan Note (Signed)
Resolved completely

## 2016-08-28 NOTE — Assessment & Plan Note (Signed)
Controlled, no change in medication DASH diet and commitment to daily physical activity for a minimum of 30 minutes discussed and encouraged, as a part of hypertension management. The importance of attaining a healthy weight is also discussed.  BP/Weight 08/25/2016 08/24/2016 08/10/2016 05/23/2016 05/17/2016 05/02/2016 11/23/9468  Systolic BP 761 518 343 735 789 784 784  Diastolic BP 82 71 84 77 82 89 93  Wt. (Lbs) 196 194 194 187 187 183 183  BMI 30.7 30.38 30.38 29.29 29.29 28.66 28.66

## 2016-08-28 NOTE — Progress Notes (Signed)
   Teresa Arellano     MRN: [id number]      DOB: 09-29-1950   HPI Teresa Arellano is here for follow up and re-evaluation of cellulitis of LLE, which has fully resolved. She however now has a new problem was in ED las night for left otitis externa, which is already subjectively improving with topical medication ROS See HPI Denies recent fever or chills. Denies sinus pressure, nasal congestion,  or sore throat. Denies chest congestion, productive cough or wheezing.  Denies joint pain, swelling and limitation in mobility. Denies headaches, seizures, numbness, or tingling. Denies depression, anxiety or insomnia. Denies skin break down or rash.   PE  BP 118/82 (BP Location: Left Arm, Patient Position: Sitting, Cuff Size: Normal)   Pulse 72   Ht 5\' 7"  (1.702 m)   Wt 196 lb (88.9 kg)   SpO2 99%   BMI 30.70 kg/m   Patient alert and oriented and in no cardiopulmonary distress.  HEENT: No facial asymmetry, EOMI,   oropharynx pink and moist.  Neck supple no JVD, no mass.Left TM not visible due to swelling and erythema of left external canal  Chest: Clear to auscultation bilaterally.  CVS: S1, S2 no murmurs, no S3.Regular rate.  ABD: Soft non tender.   Ext: No edema, no warmth or tenderness of left lower extremity  MS: Adequate ROM spine, shoulders, hips and knees.  Skin: Intact, no ulcerations or rash noted.  Psych: Good eye contact, normal affect. Memory intact not anxious or depressed appearing.  CNS: CN 2-12 intact, power,  normal throughout.no focal deficits noted.   Assessment & Plan  Left otitis externa Pt to commit to topical antibiotic as prescribed  Essential hypertension, benign Controlled, no change in medication DASH diet and commitment to daily physical activity for a minimum of 30 minutes discussed and encouraged, as a part of hypertension management. The importance of attaining a healthy weight is also discussed.  BP/Weight 08/25/2016 08/24/2016 08/10/2016  05/23/2016 05/17/2016 05/02/2016 0/06/7046  Systolic BP 889 169 450 388 828 003 491  Diastolic BP 82 71 84 77 82 89 93  Wt. (Lbs) 196 194 194 187 187 183 183  BMI 30.7 30.38 30.38 29.29 29.29 28.66 28.66       Cellulitis of left leg Resolved completely

## 2016-09-01 ENCOUNTER — Ambulatory Visit (HOSPITAL_COMMUNITY)
Admission: RE | Admit: 2016-09-01 | Discharge: 2016-09-01 | Disposition: A | Discharge status: Home / Self Care | Payer: Medicare HMO | Source: Ambulatory Visit | Attending: Family Medicine | Admitting: Family Medicine

## 2016-09-01 DIAGNOSIS — M85832 Other specified disorders of bone density and structure, left forearm: Secondary | ICD-10-CM | POA: Diagnosis not present

## 2016-09-01 DIAGNOSIS — Z13820 Encounter for screening for osteoporosis: Secondary | ICD-10-CM

## 2016-09-01 DIAGNOSIS — M85851 Other specified disorders of bone density and structure, right thigh: Secondary | ICD-10-CM | POA: Insufficient documentation

## 2016-09-01 DIAGNOSIS — Z780 Asymptomatic menopausal state: Secondary | ICD-10-CM | POA: Diagnosis not present

## 2016-11-08 ENCOUNTER — Encounter: Payer: Medicare HMO | Admitting: Family Medicine

## 2016-11-22 ENCOUNTER — Ambulatory Visit (INDEPENDENT_AMBULATORY_CARE_PROVIDER_SITE_OTHER): Payer: Medicare HMO | Admitting: Family Medicine

## 2016-11-22 ENCOUNTER — Encounter: Payer: Self-pay | Admitting: Family Medicine

## 2016-11-22 VITALS — BP 122/82 | HR 63 | Temp 98.3°F | Resp 16 | Ht 67.0 in | Wt 198.1 lb

## 2016-11-22 DIAGNOSIS — I10 Essential (primary) hypertension: Secondary | ICD-10-CM | POA: Diagnosis not present

## 2016-11-22 DIAGNOSIS — Z0000 Encounter for general adult medical examination without abnormal findings: Secondary | ICD-10-CM | POA: Diagnosis not present

## 2016-11-22 DIAGNOSIS — R319 Hematuria, unspecified: Secondary | ICD-10-CM

## 2016-11-22 DIAGNOSIS — M8580 Other specified disorders of bone density and structure, unspecified site: Secondary | ICD-10-CM | POA: Diagnosis not present

## 2016-11-22 DIAGNOSIS — Z1231 Encounter for screening mammogram for malignant neoplasm of breast: Secondary | ICD-10-CM | POA: Diagnosis not present

## 2016-11-22 DIAGNOSIS — E559 Vitamin D deficiency, unspecified: Secondary | ICD-10-CM | POA: Diagnosis not present

## 2016-11-22 LAB — POCT URINALYSIS DIPSTICK
Bilirubin, UA: NEGATIVE
GLUCOSE UA: NEGATIVE
Ketones, UA: NEGATIVE
LEUKOCYTES UA: NEGATIVE
Nitrite, UA: NEGATIVE
Protein, UA: NEGATIVE
SPEC GRAV UA: 1.025 (ref 1.010–1.025)
UROBILINOGEN UA: 0.2 U/dL
pH, UA: 5 (ref 5.0–8.0)

## 2016-11-22 NOTE — Progress Notes (Signed)
Preventive Screening-Counseling & Management   Patient present here today for a welcome to  [place] wellness visit.   Current Problems (verified)   Medications Prior to Visit Allergies (verified)   PAST HISTORY  Family History  Social History : Married mother of 3 adult children, one daughter has thyroid disease   Risk Factors  Current exercise habits:  8 mile walking daily exercise tape at home , 4 days per week  Dietary issues discussed:Increase fruit and vegetable, cur t back on fried and fatty foods   Cardiac risk factors: Brother had CABG x 2 in his 1's, another brother had MI twice in his late 65's  Depression Screen  (Note: if answer to either of the following is "Yes", a more complete depression screening is indicated)   Over the past two weeks, have you felt down, depressed or hopeless? No  Over the past two weeks, have you felt little interest or pleasure in doing things? No  Have you lost interest or pleasure in daily life? No  Do you often feel hopeless? No  Do you cry easily over simple problems? No   Activities of Daily Living  In your present state of health, do you have any difficulty performing the following activities?  Driving?: No Managing money?: No Feeding yourself?:No Getting from bed to chair?:No Climbing a flight of stairs?:No Preparing food and eating?:No Bathing or showering?:No Getting dressed?:No Getting to the toilet?:No Using the toilet?:No Moving around from place to place?: No  Fall Risk Assessment In the past year have you fallen or had a near fall?:No Are you currently taking any medications that make you dizzy?:No   Hearing Difficulties: No Do you often ask people to speak up or repeat themselves?:No Do you experience ringing or noises in your ears?:No Do you have difficulty understanding soft or whispered voices?:No  Cognitive Testing  Alert? Yes Normal Appearance?Yes  Oriented to person? Yes Place? Yes  Time? Yes   Displays appropriate judgment?Yes  Can read the correct time from a watch face? yes Are you having problems remembering things?No  Advanced Directives have been discussed with the patient?Yes    List the Names of Other Physician/Practitioners you currently use:    Indicate any recent Medical Services you may have received from other than [facility] providers in the past year (date may be approximate).     Medicare Attestation  I have personally reviewed:  The patient's medical and social history  Their use of alcohol, tobacco or illicit drugs  Their current medications and supplements  The patient's functional ability including ADLs,fall risks, home safety risks, cognitive, and hearing and visual impairment  Diet and physical activities  Evidence for depression or mood disorders  The patient's weight, height, BMI, and visual acuity have been recorded in the chart. I have made referrals, counseling, and provided education to the patient based on review of the above and I have provided the patient with a written personalized care plan for preventive services.    Physical Exam BP 122/82 (BP Location: Right Arm, Patient Position: Sitting, Cuff Size: Normal)   Pulse 63   Temp 98.3 F (36.8 C) (Other (Comment))   Resp 16   Ht 5\' 7"  (1.702 m)   Wt 198 lb 1.9 oz (89.9 kg)   SpO2 98%   BMI 31.03 kg/m    Assessment & Plan:  Welcome to Medicare preventive visit Welcome to medicare  exam as documented. Counseling done  re healthy lifestyle involving commitment to 150 minutes exercise  per week, heart healthy diet, and attaining healthy weight.The importance of adequate sleep also discussed. Regular seat belt use and home safety, is also discussed. Changes in health habits are decided on by the patient with goals and time frames  set for achieving them. Immunization and cancer screening needs are specifically addressed at this visit. Advanced care planning discussion initiated and material  provided for review and future discussion   Hematuria, undiagnosed cause Need urology to eval

## 2016-11-22 NOTE — Patient Instructions (Addendum)
Annual physical exam with Advanced planning, 12/ 28 or after, call if  You need me sooner  Please start calcium 1000 mg to 1200 mg daily withVit D 800 IU to 1000 IU daily our bones are thinning  Need to get mammogram scheduled at check out due end August  Please start reviewing e Advanced planning material presented we will discuss at next visit, please bring it back  Chem 7 and vit D  And cholesterol today  Weigth loss goal of 6 pounds  Please work on good  health habits so that your health will improve. 1. Commitment to daily physical activity for 30 to 60  minutes, if you are able to do this.  2. Commitment to wise food choices. Aim for half of your  food intake to be vegetable and fruit, one quarter starchy foods, and one quarter protein. Try to eat on a regular schedule  3 meals per day, snacking between meals should be limited to vegetables or fruits or small portions of nuts. 64 ounces of water per day is generally recommended, unless you have specific health conditions, like heart failure or kidney failure where you will need to limit fluid intake.  3. Commitment to sufficient and a  good quality of physical and mental rest daily, generally between 6 to 8 hours per day.  WITH PERSISTANCE AND PERSEVERANCE, THE IMPOSSIBLE , BECOMES THE NORM!  It is important that you exercise regularly at least 30 minutes 5 times a week. If you develop chest pain, have severe difficulty breathing, or feel very tired, stop exercising immediately and seek medical attention    Thank you  for choosing [facility]. We consider it a privelige to serve you.  Delivering excellent health care in a caring and  compassionate way is our goal.  Partnering with you,  so that together we can achieve this goal is our strategy.

## 2016-11-22 NOTE — Progress Notes (Signed)
Preventive Screening-Counseling & Management   Patient present here today for a Medicare Annual Wellness Visit.   Current Problems (verified)   Medications Prior to Visit Allergies (verified)   PAST HISTORY  Family History  Social History    Risk Factors  Current exercise habits: Walks 4 miles before work, and again at night.   Dietary issues discussed: Eats vegetables and fruits. Is trying to stop eating unhealthy food. Stays away from fast food  Cardiac risk factors:   Depression Screen  (Note: if answer to either of the following is "Yes", a more complete depression screening is indicated)   Over the past two weeks, have you felt down, depressed or hopeless? No. Over the past two weeks, have you felt little interest or pleasure in doing things? No  Have you lost interest or pleasure in daily life? No  Do you often feel hopeless? No  Do you cry easily over simple problems? No   Activities of Daily Living  In your present state of health, do you have any difficulty performing the following activities?  Driving?: No Managing money?: No Feeding yourself?:No Getting from bed to chair?:No Climbing a flight of stairs?:No Preparing food and eating?:No Bathing or showering?:No Getting dressed?:No Getting to the toilet?:No Using the toilet?:No Moving around from place to place?: No  Fall Risk Assessment In the past year have you fallen or had a near fall?:No Are you currently taking any medications that make you experience  dizziness?:No   Hearing Difficulties: No Do you often ask people to speak up or repeat themselves?:No Do you experience ringing or noises in your ears?:No Do you have difficulty understanding soft or whispered voices?:No  Cognitive Testing  Alert? Yes Normal Appearance?Yes  Oriented to person? Yes Place? Yes  Time? Yes  Displays appropriate judgment?Yes  Can read the correct time from a watch face? yes Are you having problems remembering  things?No  Advanced Directives have been discussed with the patient?Yes    List the Names of Other Physician/Practitioners you currently use:    Indicate any recent Medical Services you may have received from other than [facility] providers in the past year (date may be approximate).   Assessment:    Annual Wellness Exam   Plan:    During the course of the visit the patient was educated and counseled about appropriate screening and preventive services including:  A healthy diet is rich in fruit, vegetables and whole grains. Poultry fish, nuts and beans are a healthy choice for protein rather then red meat. A low sodium diet and drinking 64 ounces of water daily is generally recommended. Oils and sweet should be limited. Carbohydrates especially for those who are diabetic or overweight, should be limited to 30-45 gram per meal. It is important to eat on a regular schedule, at least 3 times daily. Snacks should be primarily fruits, vegetables or nuts. It is important that you exercise regularly at least 30 minutes 5 times a week. If you develop chest pain, have severe difficulty breathing, or feel very tired, stop exercising immediately and seek medical attention  Immunization reviewed and updated. Cancer screening reviewed and updated    Patient Instructions (the written plan) was given to the patient.  Medicare Attestation  I have personally reviewed:  The patient's medical and social history  Their use of alcohol, tobacco or illicit drugs  Their current medications and supplements  The patient's functional ability including ADLs,fall risks, home safety risks, cognitive, and hearing and visual impairment  Diet  and physical activities  Evidence for depression or mood disorders  The patient's weight, height, BMI, and visual acuity have been recorded in the chart. I have made referrals, counseling, and provided education to the patient based on review of the above and I have provided the  patient with a written personalized care plan for preventive services.

## 2016-11-23 ENCOUNTER — Other Ambulatory Visit (HOSPITAL_COMMUNITY)
Admission: RE | Admit: 2016-11-23 | Discharge: 2016-11-23 | Disposition: A | Discharge status: Home / Self Care | Payer: Medicare HMO | Source: Ambulatory Visit | Attending: Family Medicine | Admitting: Family Medicine

## 2016-11-23 DIAGNOSIS — R319 Hematuria, unspecified: Secondary | ICD-10-CM | POA: Insufficient documentation

## 2016-11-23 LAB — COMPLETE METABOLIC PANEL WITH GFR
ALBUMIN: 4.2 g/dL (ref 3.6–5.1)
ALK PHOS: 63 U/L (ref 33–130)
ALT: 19 U/L (ref 6–29)
AST: 22 U/L (ref 10–35)
BUN: 22 mg/dL (ref 7–25)
CALCIUM: 9.5 mg/dL (ref 8.6–10.4)
CO2: 22 mmol/L (ref 20–32)
CREATININE: 0.81 mg/dL (ref 0.50–0.99)
Chloride: 104 mmol/L (ref 98–110)
GFR, Est African American: 88 mL/min (ref >=60)
GFR, Est Non African American: 76 mL/min (ref >=60)
Glucose, Bld: 90 mg/dL (ref 65–99)
POTASSIUM: 3.8 mmol/L (ref 3.5–5.3)
Sodium: 139 mmol/L (ref 135–146)
Total Bilirubin: 0.4 mg/dL (ref 0.2–1.2)
Total Protein: 7.4 g/dL (ref 6.1–8.1)

## 2016-11-23 LAB — URINALYSIS, ROUTINE W REFLEX MICROSCOPIC
Bilirubin Urine: NEGATIVE
GLUCOSE, UA: NEGATIVE mg/dL
Ketones, ur: NEGATIVE mg/dL
Leukocytes, UA: NEGATIVE
Nitrite: NEGATIVE
PROTEIN: NEGATIVE mg/dL
Specific Gravity, Urine: 1.019 (ref 1.005–1.030)
pH: 5 (ref 5.0–8.0)

## 2016-11-23 LAB — VITAMIN D 25 HYDROXY (VIT D DEFICIENCY, FRACTURES): Vit D, 25-Hydroxy: 25 ng/mL — ABNORMAL LOW (ref 30–100)

## 2016-11-25 DIAGNOSIS — R319 Hematuria, unspecified: Secondary | ICD-10-CM | POA: Insufficient documentation

## 2016-11-25 NOTE — Assessment & Plan Note (Signed)
Need urology to eval

## 2016-11-25 NOTE — Assessment & Plan Note (Signed)
Welcome to medicare  exam as documented. Counseling done  re healthy lifestyle involving commitment to 150 minutes exercise per week, heart healthy diet, and attaining healthy weight.The importance of adequate sleep also discussed. Regular seat belt use and home safety, is also discussed. Changes in health habits are decided on by the patient with goals and time frames  set for achieving them. Immunization and cancer screening needs are specifically addressed at this visit. Advanced care planning discussion initiated and material provided for review and future discussion

## 2016-12-02 ENCOUNTER — Other Ambulatory Visit: Payer: Self-pay | Admitting: Family Medicine

## 2016-12-02 DIAGNOSIS — R3129 Other microscopic hematuria: Secondary | ICD-10-CM

## 2016-12-06 ENCOUNTER — Other Ambulatory Visit: Payer: Self-pay | Admitting: Family Medicine

## 2016-12-06 ENCOUNTER — Telehealth: Payer: Self-pay | Admitting: *Deleted

## 2016-12-06 NOTE — Telephone Encounter (Signed)
Med was sent to day and tried to call patient back with no answer

## 2016-12-06 NOTE — Telephone Encounter (Signed)
Patient called stating she is out of blood pressure pills and has not been able to take one today. Patient states she really needs her blood pressure pills to be refilled. Please advise

## 2016-12-07 ENCOUNTER — Ambulatory Visit (HOSPITAL_COMMUNITY): Payer: Medicare HMO

## 2016-12-08 ENCOUNTER — Ambulatory Visit (HOSPITAL_COMMUNITY)
Admission: RE | Admit: 2016-12-08 | Discharge: 2016-12-08 | Disposition: A | Discharge status: Home / Self Care | Payer: Medicare HMO | Source: Ambulatory Visit | Attending: Family Medicine | Admitting: Family Medicine

## 2016-12-08 DIAGNOSIS — Z1231 Encounter for screening mammogram for malignant neoplasm of breast: Secondary | ICD-10-CM | POA: Diagnosis not present

## 2017-01-09 ENCOUNTER — Other Ambulatory Visit: Payer: Self-pay | Admitting: Family Medicine

## 2017-02-21 ENCOUNTER — Ambulatory Visit: Payer: Medicare HMO | Admitting: Urology

## 2017-02-21 ENCOUNTER — Other Ambulatory Visit (HOSPITAL_COMMUNITY)
Admission: RE | Admit: 2017-02-21 | Discharge: 2017-02-21 | Disposition: A | Discharge status: Home / Self Care | Payer: Medicare HMO | Source: Other Acute Inpatient Hospital | Attending: Urology | Admitting: Urology

## 2017-02-21 DIAGNOSIS — R311 Benign essential microscopic hematuria: Secondary | ICD-10-CM | POA: Diagnosis not present

## 2017-02-21 LAB — URINALYSIS, COMPLETE (UACMP) WITH MICROSCOPIC
BACTERIA UA: NONE SEEN
BILIRUBIN URINE: NEGATIVE
Glucose, UA: NEGATIVE mg/dL
Ketones, ur: NEGATIVE mg/dL
LEUKOCYTES UA: NEGATIVE
Nitrite: NEGATIVE
PROTEIN: NEGATIVE mg/dL
SPECIFIC GRAVITY, URINE: 1.014 (ref 1.005–1.030)
pH: 5 (ref 5.0–8.0)

## 2017-04-13 ENCOUNTER — Encounter: Payer: Medicare HMO | Admitting: Family Medicine

## 2017-04-24 IMAGING — MG DIGITAL SCREENING BILATERAL MAMMOGRAM WITH CAD
4 series · 4 of 4 positions shown · non-contrast
Comparison: Previous exam(s).

CLINICAL DATA: Screening.

EXAM:
DIGITAL SCREENING BILATERAL MAMMOGRAM WITH CAD

[R MLO (1 of 2)]
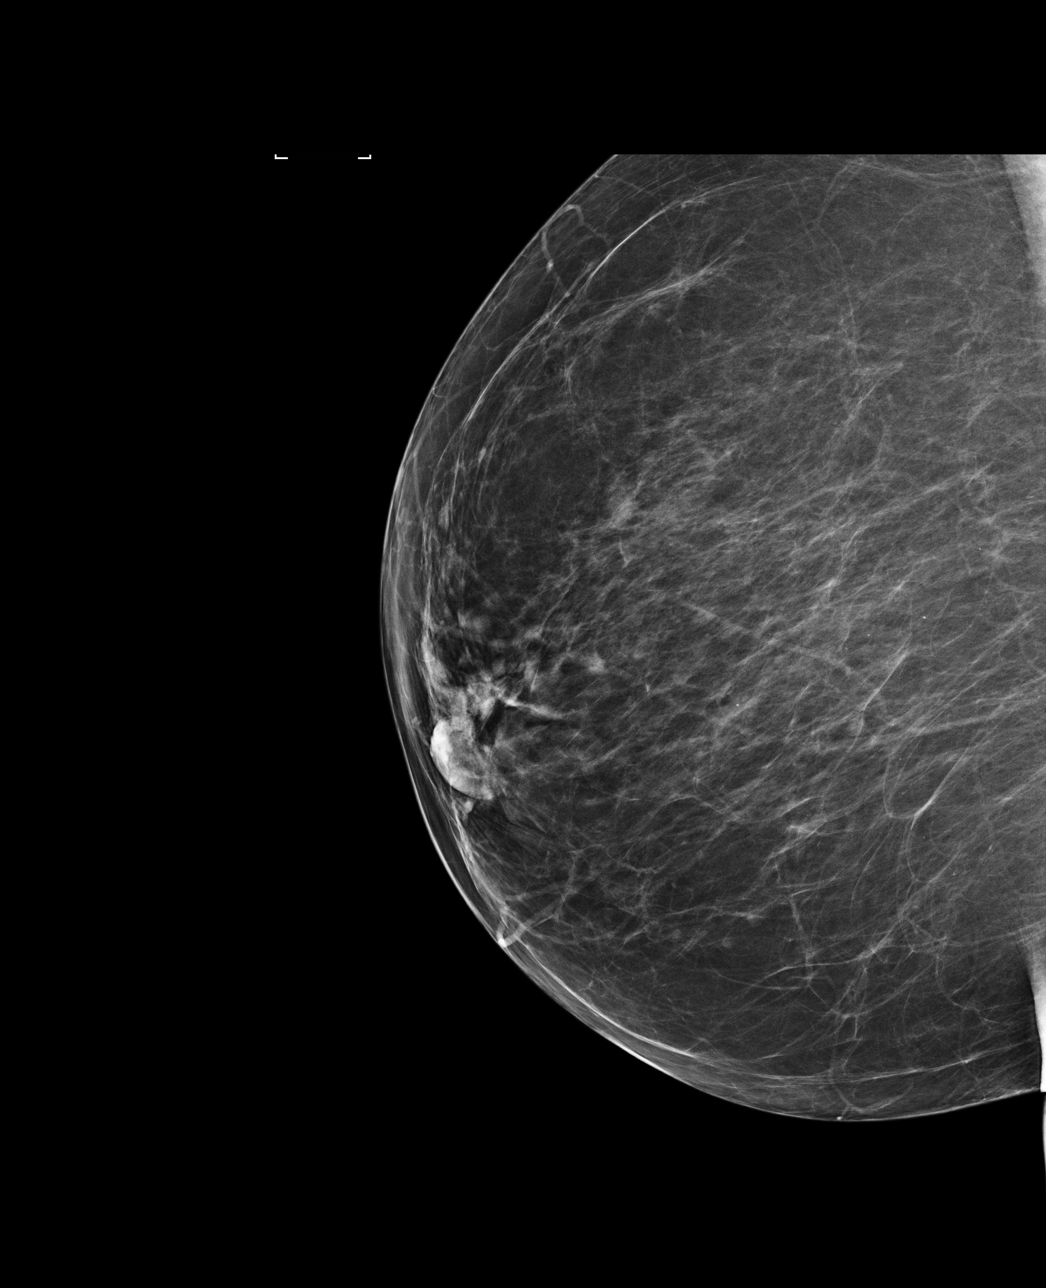

[R MLO (2 of 2)]
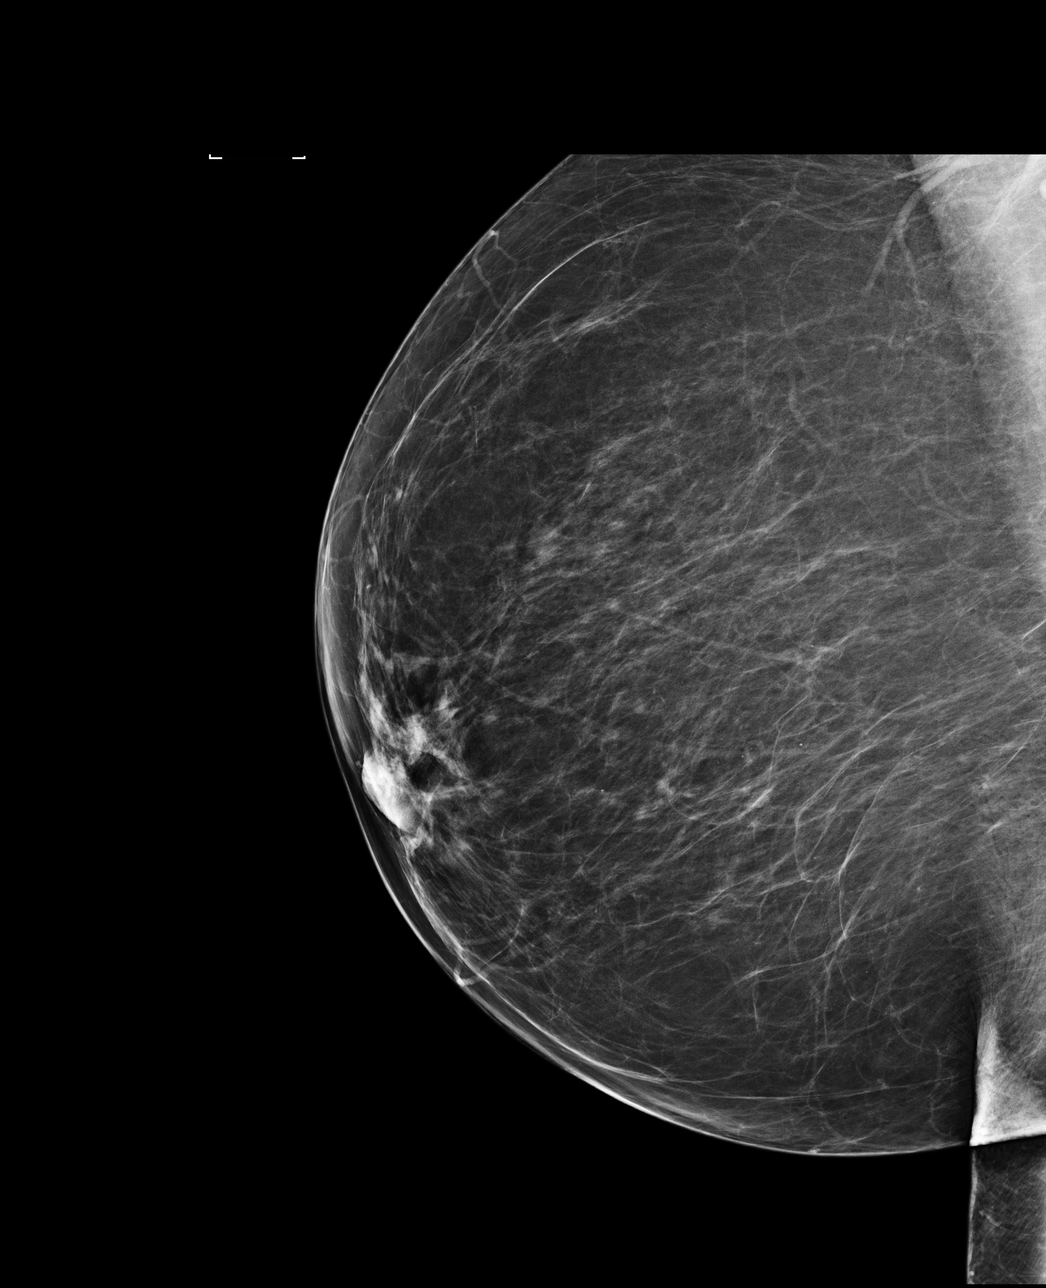

[R CC]
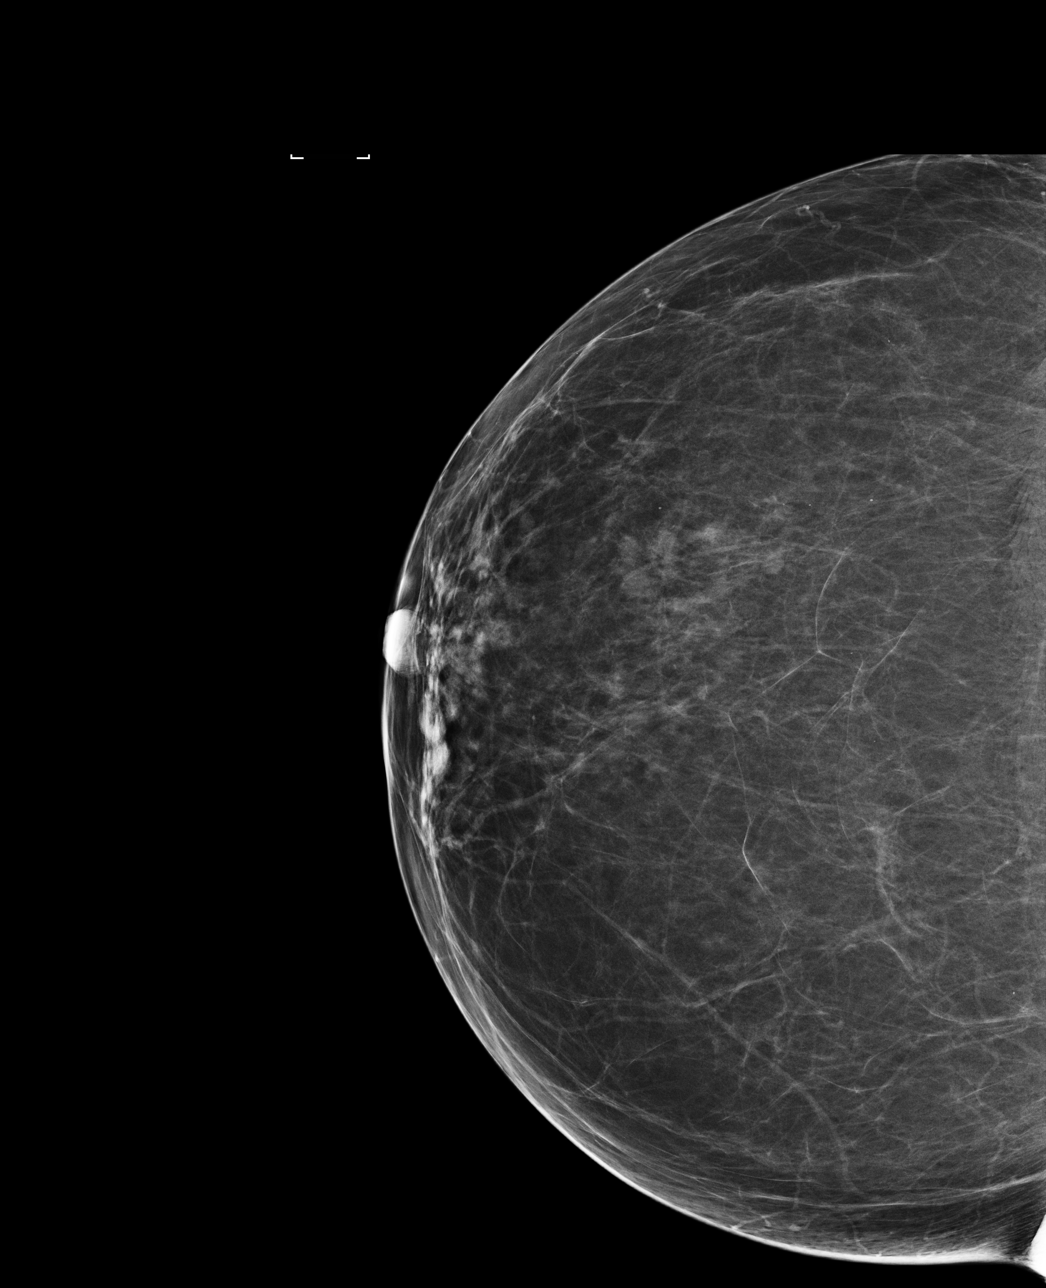

[L MLO]
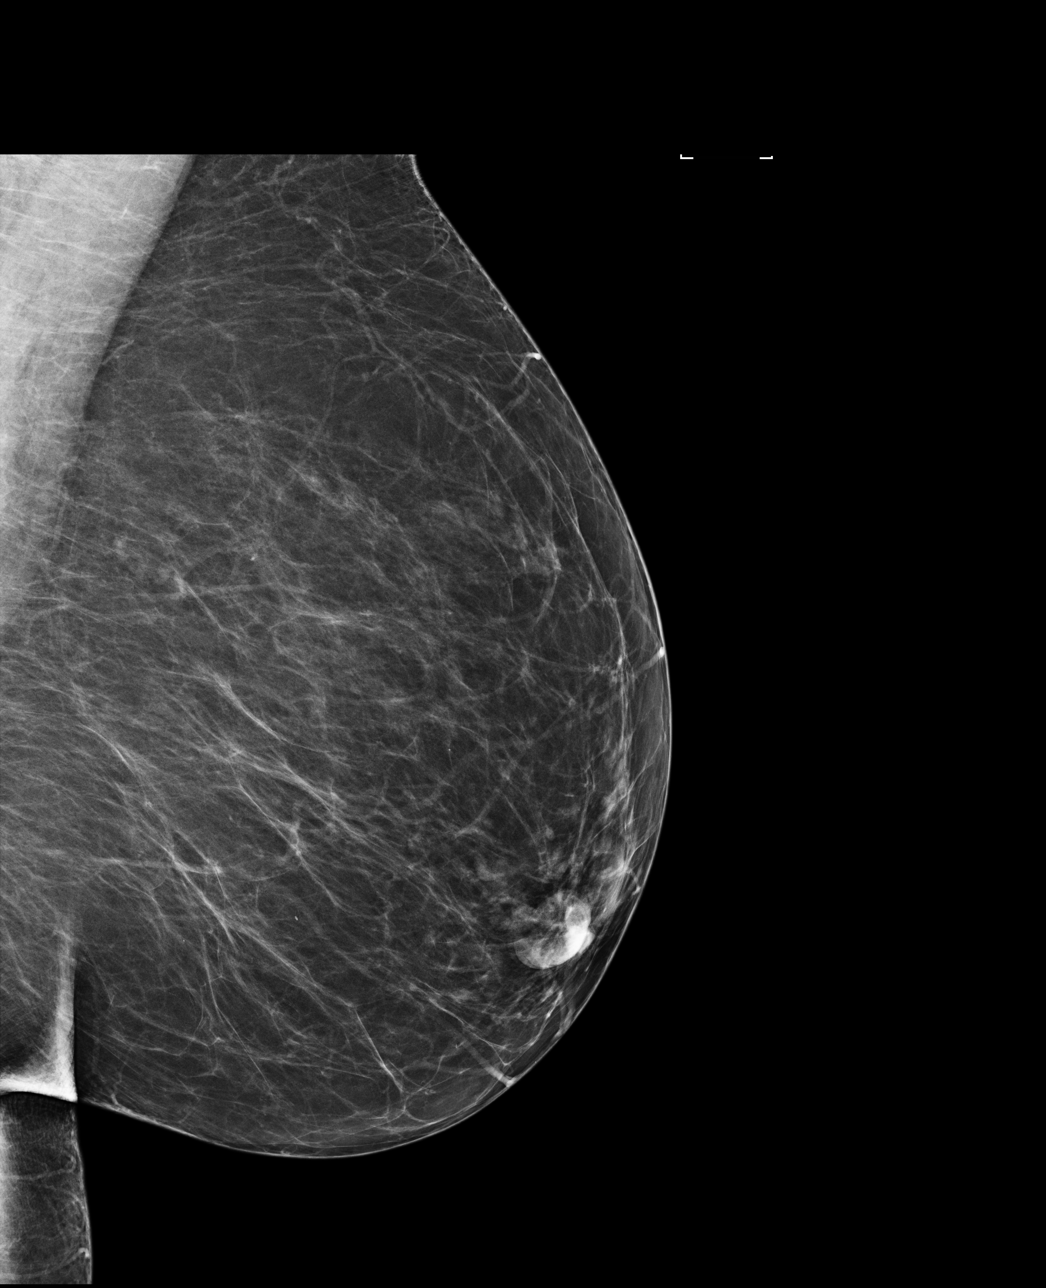

[4 of 4 positions shown; findings below may reference images not displayed]

ACR Breast Density Category b: There are scattered areas of
fibroglandular density.
FINDINGS: There are no findings suspicious for malignancy. Images were
processed with CAD.
IMPRESSION: No mammographic evidence of malignancy. A result letter of this
screening mammogram will be mailed directly to the patient.

RECOMMENDATION:
Screening mammogram in one year. (Code:AS-G-LCT)

BI-RADS CATEGORY  1: Negative.

## 2017-05-04 ENCOUNTER — Encounter: Payer: Medicare HMO | Admitting: Family Medicine

## 2017-05-18 ENCOUNTER — Ambulatory Visit (INDEPENDENT_AMBULATORY_CARE_PROVIDER_SITE_OTHER): Payer: Medicare HMO | Admitting: Family Medicine

## 2017-05-18 ENCOUNTER — Encounter: Payer: Self-pay | Admitting: Family Medicine

## 2017-05-18 VITALS — BP 118/82 | HR 79 | Resp 16 | Ht 67.0 in | Wt 207.0 lb

## 2017-05-18 DIAGNOSIS — Z1211 Encounter for screening for malignant neoplasm of colon: Secondary | ICD-10-CM

## 2017-05-18 DIAGNOSIS — E559 Vitamin D deficiency, unspecified: Secondary | ICD-10-CM

## 2017-05-18 DIAGNOSIS — R7301 Impaired fasting glucose: Secondary | ICD-10-CM

## 2017-05-18 DIAGNOSIS — F321 Major depressive disorder, single episode, moderate: Secondary | ICD-10-CM

## 2017-05-18 DIAGNOSIS — E876 Hypokalemia: Secondary | ICD-10-CM

## 2017-05-18 DIAGNOSIS — Z23 Encounter for immunization: Secondary | ICD-10-CM | POA: Diagnosis not present

## 2017-05-18 DIAGNOSIS — I10 Essential (primary) hypertension: Secondary | ICD-10-CM

## 2017-05-18 DIAGNOSIS — Z0000 Encounter for general adult medical examination without abnormal findings: Secondary | ICD-10-CM

## 2017-05-18 DIAGNOSIS — Z13220 Encounter for screening for lipoid disorders: Secondary | ICD-10-CM

## 2017-05-18 LAB — POC HEMOCCULT BLD/STL (OFFICE/1-CARD/DIAGNOSTIC): FECAL OCCULT BLD: NEGATIVE

## 2017-05-18 MED ORDER — FLUOXETINE HCL 10 MG PO TABS: 10.0000 mg | ORAL_TABLET | Freq: Every day | ORAL | 3 refills | Status: DC

## 2017-05-18 NOTE — Patient Instructions (Addendum)
F/u in 2 months, call if you need me before  New for depression is fluoxetine is 10 mg daily and I recommend you speak with  Your husband about marriage counselling you both agree you need this , I will,ook at this for you locally  Mammogram needs to be scheduled at checkout please  Work on food choice, eat fruit and vegetable and drink only water, eat at regular times and stop eating after 7 at night  It is important that you exercise regularly at least 30 minutes 5 times a week. If you develop chest pain, have severe difficulty breathing, or feel very tired, stop exercising immediately and seek medical attention   Aim for 6 to 8 pound weight loss in the next 4 months,you can do this without any "diet pills"  Flu vaccine and Pneumonia 23 vaccines today  Fasting cBC, lipid, cmp and eGFr, hBA1C and tSH  And vit D asap

## 2017-05-19 ENCOUNTER — Encounter: Payer: Self-pay | Admitting: Family Medicine

## 2017-05-19 DIAGNOSIS — E559 Vitamin D deficiency, unspecified: Secondary | ICD-10-CM | POA: Diagnosis not present

## 2017-05-19 DIAGNOSIS — I10 Essential (primary) hypertension: Secondary | ICD-10-CM | POA: Diagnosis not present

## 2017-05-19 DIAGNOSIS — Z13220 Encounter for screening for lipoid disorders: Secondary | ICD-10-CM | POA: Diagnosis not present

## 2017-05-19 DIAGNOSIS — F321 Major depressive disorder, single episode, moderate: Secondary | ICD-10-CM | POA: Insufficient documentation

## 2017-05-19 DIAGNOSIS — R7301 Impaired fasting glucose: Secondary | ICD-10-CM | POA: Diagnosis not present

## 2017-05-19 NOTE — Assessment & Plan Note (Signed)
Annual exam as documented. Counseling done  re healthy lifestyle involving commitment to 150 minutes exercise per week, heart healthy diet, and attaining healthy weight.The importance of adequate sleep also discussed.  Immunization and cancer screening needs are specifically addressed at this visit.  

## 2017-05-19 NOTE — Progress Notes (Addendum)
    Teresa Arellano     MRN: [id number]      DOB: 1951/01/28  HPI: Patient is in for annual physical exam. C/o weight gain, was asking for appetite suppressant questioning patient has been overeating because of depression , has poor relationship/ no relationship with her spouse Recent labs, if available are reviewed. Immunization is reviewed , and  updated   PE: BP 118/82   Pulse 79   Resp 16   Ht 5\' 7"  (1.702 m)   Wt 207 lb (93.9 kg)   SpO2 98%   BMI 32.42 kg/m   Pleasant  female, alert and oriented x 3, in no cardio-pulmonary distress. Afebrile. HEENT No facial trauma or asymetry. Sinuses non tender.  Extra occullar muscles intact, pupils equally reactive to light. External ears normal, tympanic membranes clear. Oropharynx moist, no exudate. Neck: supple, no adenopathy,JVD or thyromegaly.No bruits.  Chest: Clear to ascultation bilaterally.No crackles or wheezes. Non tender to palpation  Breast: No asymetry,no masses or lumps. No tenderness. No nipple discharge or inversion. No axillary or supraclavicular adenopathy  Cardiovascular system; Heart sounds normal,  S1 and  S2 ,no S3.  No murmur, or thrill. Apical beat not displaced Peripheral pulses normal.  Abdomen: Soft, non tender, no organomegaly or masses. No bruits. Bowel sounds normal. No guarding, tenderness or rebound.  Rectal:  Normal sphincter tone. No rectal mass. Guaiac negative stool.  GU: Not examined, asymptomatic  Musculoskeletal exam: Full ROM of spine, hips , shoulders and knees. No deformity ,swelling or crepitus noted. No muscle wasting or atrophy.   Neurologic: Cranial nerves 2 to 12 intact. Power, tone ,sensation and reflexes normal throughout. No disturbance in gait. No tremor.  Skin: Intact, no ulceration, erythema , scaling or rash noted. Pigmentation normal throughout  Psych; Depressed mood and anxious. Judgement and concentration normal   Assessment & Plan:  Annual  physical exam Annual exam as documented. Counseling done  re healthy lifestyle involving commitment to 150 minutes exercise per week, heart healthy diet, and attaining healthy weight.The importance of adequate sleep also discussed.  Immunization and cancer screening needs are specifically addressed at this visit.   Depression, major, single episode, moderate ([place]) Start fluoxetine, patient to discuss marriage counseling with her spouse

## 2017-05-19 NOTE — Assessment & Plan Note (Signed)
Start fluoxetine, patient to discuss marriage counseling with her spouse

## 2017-05-20 LAB — VITAMIN D 25 HYDROXY (VIT D DEFICIENCY, FRACTURES): VIT D 25 HYDROXY: 28 ng/mL — AB (ref 30–100)

## 2017-05-20 LAB — COMPLETE METABOLIC PANEL WITH GFR
AG Ratio: 1.1 (calc) (ref 1.0–2.5)
ALT: 31 U/L — AB (ref 6–29)
AST: 33 U/L (ref 10–35)
Albumin: 3.8 g/dL (ref 3.6–5.1)
Alkaline phosphatase (APISO): 67 U/L (ref 33–130)
BUN: 15 mg/dL (ref 7–25)
CO2: 30 mmol/L (ref 20–32)
Calcium: 9.2 mg/dL (ref 8.6–10.4)
Chloride: 103 mmol/L (ref 98–110)
Creat: 0.74 mg/dL (ref 0.50–0.99)
GFR, EST AFRICAN AMERICAN: 98 mL/min/{1.73_m2} (ref >=60)
GFR, Est Non African American: 84 mL/min/{1.73_m2} (ref >=60)
GLUCOSE: 93 mg/dL (ref 65–99)
Globulin: 3.4 g/dL (calc) (ref 1.9–3.7)
Potassium: 3.8 mmol/L (ref 3.5–5.3)
Sodium: 140 mmol/L (ref 135–146)
TOTAL PROTEIN: 7.2 g/dL (ref 6.1–8.1)
Total Bilirubin: 0.7 mg/dL (ref 0.2–1.2)

## 2017-05-20 LAB — LIPID PANEL
Cholesterol: 156 mg/dL (ref <200)
HDL: 58 mg/dL (ref >50)
LDL Cholesterol (Calc): 80 mg/dL (calc)
NON-HDL CHOLESTEROL (CALC): 98 mg/dL (ref <130)
Total CHOL/HDL Ratio: 2.7 (calc) (ref <5.0)
Triglycerides: 93 mg/dL (ref <150)

## 2017-05-20 LAB — CBC
HEMATOCRIT: 36.1 % (ref 35.0–45.0)
Hemoglobin: 12.1 g/dL (ref 11.7–15.5)
MCH: 26.2 pg — ABNORMAL LOW (ref 27.0–33.0)
MCHC: 33.5 g/dL (ref 32.0–36.0)
MCV: 78.3 fL — AB (ref 80.0–100.0)
MPV: 9.6 fL (ref 7.5–12.5)
PLATELETS: 287 10*3/uL (ref 140–400)
RBC: 4.61 10*6/uL (ref 3.80–5.10)
RDW: 12.8 % (ref 11.0–15.0)
WBC: 7.5 10*3/uL (ref 3.8–10.8)

## 2017-05-20 LAB — TSH: TSH: 0.54 mIU/L (ref 0.40–4.50)

## 2017-05-20 LAB — HEMOGLOBIN A1C
EAG (MMOL/L): 6 (calc)
HEMOGLOBIN A1C: 5.4 %{Hb} (ref <5.7)
MEAN PLASMA GLUCOSE: 108 (calc)

## 2017-05-22 ENCOUNTER — Encounter: Payer: Self-pay | Admitting: Family Medicine

## 2017-06-08 ENCOUNTER — Other Ambulatory Visit: Payer: Self-pay | Admitting: Family Medicine

## 2017-06-09 ENCOUNTER — Other Ambulatory Visit: Payer: Self-pay

## 2017-06-09 MED ORDER — TRIAMTERENE-HCTZ 37.5-25 MG PO TABS: 1.0000 | ORAL_TABLET | Freq: Every day | ORAL | 1 refills | Status: DC

## 2017-07-13 ENCOUNTER — Ambulatory Visit: Payer: Medicare HMO | Admitting: Family Medicine

## 2017-07-27 ENCOUNTER — Ambulatory Visit (INDEPENDENT_AMBULATORY_CARE_PROVIDER_SITE_OTHER): Payer: Medicare HMO | Admitting: Family Medicine

## 2017-07-27 ENCOUNTER — Encounter: Payer: Self-pay | Admitting: Family Medicine

## 2017-07-27 VITALS — BP 120/82 | HR 67 | Resp 16 | Ht 67.0 in | Wt 205.0 lb

## 2017-07-27 DIAGNOSIS — I10 Essential (primary) hypertension: Secondary | ICD-10-CM | POA: Diagnosis not present

## 2017-07-27 DIAGNOSIS — R69 Illness, unspecified: Secondary | ICD-10-CM | POA: Diagnosis not present

## 2017-07-27 DIAGNOSIS — E663 Overweight: Secondary | ICD-10-CM

## 2017-07-27 DIAGNOSIS — F321 Major depressive disorder, single episode, moderate: Secondary | ICD-10-CM | POA: Diagnosis not present

## 2017-07-27 MED ORDER — POTASSIUM CHLORIDE ER 10 MEQ PO TBCR: 10.0000 meq | EXTENDED_RELEASE_TABLET | Freq: Every day | ORAL | 1 refills | Status: DC

## 2017-07-27 MED ORDER — FLUOXETINE HCL 10 MG PO TABS: 10.0000 mg | ORAL_TABLET | Freq: Every day | ORAL | 1 refills | Status: DC

## 2017-07-27 NOTE — Patient Instructions (Addendum)
Please cancel June appointment with MD  Please schedule wellness with nurse in end July or early August  MD follow up in 5.5 months  Fasting chem 7 and EGFR  In 5 months   Mammogram due in early September  Please keep up regular exercise   Change to fresh fruit and vegetables for most of your meals and only drink water  Thank you  for choosing [facility]. We consider it a privelige to serve you.  Delivering excellent health care in a caring and  compassionate way is our goal.  Partnering with you,  so that together we can achieve this goal is our strategy.

## 2017-07-30 NOTE — Assessment & Plan Note (Signed)
Resolved depression on fluoxetine in 2019

## 2017-07-30 NOTE — Progress Notes (Signed)
   Teresa Arellano     MRN: [id number]      DOB: 04-14-1950   HPI Teresa Arellano is here for follow up and re-evaluation of chronic medical conditions,in particular depression, which she recently started medication for and she rep ports marked improvement to resolution. medication management and review of any available recent lab and radiology data.  Preventive health is updated, specifically  Cancer screening and Immunization.    The PT denies any adverse reactions to current medications since the last visit.    ROS Denies recent fever or chills. Denies sinus pressure, nasal congestion, ear pain or sore throat. Denies chest congestion, productive cough or wheezing. Denies chest pains, palpitations and leg swelling Denies abdominal pain, nausea, vomiting,diarrhea or constipation.   Denies dysuria, frequency, hesitancy or incontinence. Denies joint pain, swelling and limitation in mobility. Denies headaches, seizures, numbness, or tingling. Denies depression, anxiety or insomnia. Denies skin break down or rash.   PE  BP 120/82   Pulse 67   Resp 16   Ht 5\' 7"  (1.702 m)   Wt 205 lb (93 kg)   SpO2 95%   BMI 32.11 kg/m   Patient alert and oriented and in no cardiopulmonary distress.  HEENT: No facial asymmetry, EOMI,   oropharynx pink and moist.  Neck supple no JVD, no mass.  Chest: Clear to auscultation bilaterally.  CVS: S1, S2 no murmurs, no S3.Regular rate.  ABD: Soft non tender.   Ext: No edema  MS: Adequate ROM spine, shoulders, hips and knees.  Skin: Intact, no ulcerations or rash noted.  Psych: Good eye contact, normal affect. Memory intact not anxious or depressed appearing.  CNS: CN 2-12 intact, power,  normal throughout.no focal deficits noted.   Assessment & Plan  Depression, major, single episode, moderate ([facility]) Resolved depression on fluoxetine in 2019  Overweight (BMI 25.0-29.9) Improved slightly encouraged patient to start plant based diet Patient  re-educated about  the importance of commitment to a  minimum of 150 minutes of exercise per week.  The importance of healthy food choices with portion control discussed. Encouraged to start a food diary, count calories and to consider  joining a support group. Sample diet sheets offered. Goals set by the patient for the next several months.   Weight /BMI 07/27/2017 05/18/2017 11/22/2016  WEIGHT 205 lb 207 lb 198 lb 1.9 oz  HEIGHT 5\' 7"  5\' 7"  5\' 7"   BMI 32.11 kg/m2 32.42 kg/m2 31.03 kg/m2

## 2017-07-30 NOTE — Assessment & Plan Note (Signed)
Improved slightly encouraged patient to start plant based diet Patient re-educated about  the importance of commitment to a  minimum of 150 minutes of exercise per week.  The importance of healthy food choices with portion control discussed. Encouraged to start a food diary, count calories and to consider  joining a support group. Sample diet sheets offered. Goals set by the patient for the next several months.   Weight /BMI 07/27/2017 05/18/2017 11/22/2016  WEIGHT 205 lb 207 lb 198 lb 1.9 oz  HEIGHT 5\' 7"  5\' 7"  5\' 7"   BMI 32.11 kg/m2 32.42 kg/m2 31.03 kg/m2

## 2017-09-21 ENCOUNTER — Ambulatory Visit: Payer: Medicare HMO | Admitting: Family Medicine

## 2017-11-03 ENCOUNTER — Ambulatory Visit: Payer: Medicare HMO

## 2017-12-01 ENCOUNTER — Ambulatory Visit: Payer: Medicare HMO

## 2017-12-14 ENCOUNTER — Ambulatory Visit (HOSPITAL_COMMUNITY): Payer: Medicare HMO

## 2017-12-25 ENCOUNTER — Ambulatory Visit (HOSPITAL_COMMUNITY)
Admission: RE | Admit: 2017-12-25 | Discharge: 2017-12-25 | Disposition: A | Discharge status: Home / Self Care | Payer: Medicare HMO | Source: Ambulatory Visit | Attending: Family Medicine | Admitting: Family Medicine

## 2017-12-25 ENCOUNTER — Ambulatory Visit (INDEPENDENT_AMBULATORY_CARE_PROVIDER_SITE_OTHER): Payer: Medicare HMO

## 2017-12-25 VITALS — BP 118/80 | HR 65 | Resp 15 | Ht 67.0 in | Wt 200.0 lb

## 2017-12-25 DIAGNOSIS — Z1231 Encounter for screening mammogram for malignant neoplasm of breast: Secondary | ICD-10-CM | POA: Diagnosis not present

## 2017-12-25 DIAGNOSIS — Z0000 Encounter for general adult medical examination without abnormal findings: Secondary | ICD-10-CM | POA: Diagnosis not present

## 2017-12-25 NOTE — Progress Notes (Signed)
Subjective:   Teresa Arellano is a 67 y.o. female who presents for Medicare Annual (Subsequent) preventive examination.  Review of Systems:   Cardiac Risk Factors include: advanced age (>55men, >69 women);hypertension     Objective:     Vitals: BP 118/80   Pulse 65   Resp 15   Ht 5\' 7"  (1.702 m)   Wt 200 lb (90.7 kg)   SpO2 98%   BMI 31.32 kg/m   Body mass index is 31.32 kg/m.  Advanced Directives 12/25/2017 08/24/2016 05/23/2016 05/02/2016 04/27/2016 04/06/2016  Does Patient Have a Medical Advance Directive? No No No No No No  Would patient like information on creating a medical advance directive? Yes (ED - Information included in AVS) - No - Patient declined No - Patient declined No - Patient declined No - Patient declined    Tobacco Social History   Tobacco Use  Smoking Status Never Smoker  Smokeless Tobacco Never Used     Counseling given: Not Answered   Clinical Intake:  Pre-visit preparation completed: Yes  Pain : No/denies pain Pain Score: 0-No pain     Nutritional Status: BMI > 30  Obese Diabetes: No  How often do you need to have someone help you when you read instructions, pamphlets, or other written materials from your doctor or pharmacy?: 1 - Never What is the last grade level you completed in school?: college   Interpreter Needed?: No  Information entered by :: Kate Sable LPN   Past Medical History:  Diagnosis Date  . Cataract   . Hypertension 2014   Past Surgical History:  Procedure Laterality Date  . ABDOMINAL HYSTERECTOMY  1982   partial, fibroids  . CATARACT EXTRACTION W/PHACO Right 05/02/2016   Procedure: CATARACT EXTRACTION PHACO AND INTRAOCULAR LENS PLACEMENT (IOC);  Surgeon: Tonny Branch, MD;  Location: [facility];  Service: Ophthalmology;  Laterality: Right;  CDE: 5.35  . CATARACT EXTRACTION W/PHACO Left 05/23/2016   Procedure: CATARACT EXTRACTION PHACO AND INTRAOCULAR LENS PLACEMENT LEFT EYE;  Surgeon: Tonny Branch, MD;  Location: [facility];   Service: Ophthalmology;  Laterality: Left;  CDE:  5.45  . CHOLECYSTECTOMY  1991 approx  . COLONOSCOPY N/A 04/06/2016   Procedure: COLONOSCOPY;  Surgeon: Danie Binder, MD;  Location: [facility];  Service: Endoscopy;  Laterality: N/A;  10:30 Am  . POLYPECTOMY  04/06/2016   Procedure: POLYPECTOMY;  Surgeon: Danie Binder, MD;  Location: [facility];  Service: Endoscopy;;  hepatic flexure polypectomy;  . TUBAL LIGATION  1979   Family History  Problem Relation Age of Onset  . Heart disease Mother   . Diabetes Father   . Heart disease Brother   . Cancer Sister 65       breast   . Cancer Sister        stomach   . Heart disease Brother   . Hypertension Sister   . Hypertension Sister   . Diabetes Sister   . Diabetes Sister    Social History   Socioeconomic History  . Marital status: Married    Spouse name: Not on file  . Number of children: Not on file  . Years of education: college   . Highest education level: Associate degree: academic program  Occupational History  . Not on file  Social Needs  . Financial resource strain: Not hard at all  . Food insecurity:    Worry: Never true    Inability: Never true  . Transportation needs:    Medical:  No    Non-medical: No  Tobacco Use  . Smoking status: Never Smoker  . Smokeless tobacco: Never Used  Substance and Sexual Activity  . Alcohol use: No  . Drug use: No  . Sexual activity: Yes    Birth control/protection: Surgical  Lifestyle  . Physical activity:    Days per week: 6 days    Minutes per session: 60 min  . Stress: Not at all  Relationships  . Social connections:    Talks on phone: More than three times a week    Gets together: More than three times a week    Attends religious service: More than 4 times per year    Active member of club or organization: Yes    Attends meetings of clubs or organizations: More than 4 times per year    Relationship status: Married  Other Topics Concern  . Not on file  Social  History Narrative  . Not on file    Outpatient Encounter Medications as of 12/25/2017  Medication Sig  . aspirin EC 81 MG tablet Take 81 mg by mouth daily.  Marland Kitchen FLUoxetine (PROZAC) 10 MG tablet Take 1 tablet (10 mg total) by mouth daily.  . potassium chloride (K-DUR) 10 MEQ tablet Take 1 tablet (10 mEq total) by mouth daily.  Marland Kitchen triamterene-hydrochlorothiazide (MAXZIDE-25) 37.5-25 MG tablet Take 1 tablet by mouth daily.   No facility-administered encounter medications on file as of 12/25/2017.     Activities of Daily Living In your present state of health, do you have any difficulty performing the following activities: 12/25/2017  Hearing? N  Vision? N  Difficulty concentrating or making decisions? N  Walking or climbing stairs? N  Dressing or bathing? N  Doing errands, shopping? N  Preparing Food and eating ? N  Using the Toilet? N  In the past six months, have you accidently leaked urine? N  Do you have problems with loss of bowel control? N  Managing your Medications? N  Managing your Finances? N  Housekeeping or managing your Housekeeping? N  Some recent data might be hidden    Patient Care Team: Fayrene Helper, MD as PCP - General (Family Medicine)    Assessment:   This is a routine wellness examination for Teresa Arellano.  Exercise Activities and Dietary recommendations Current Exercise Habits: Home exercise routine, Time (Minutes): 60, Frequency (Times/Week): 6, Weekly Exercise (Minutes/Week): 360, Intensity: Moderate  Goals   None     Fall Risk Fall Risk  12/25/2017 05/18/2017 11/22/2016 08/25/2016 05/17/2016  Falls in the past year? No No No No No   Is the patient's home free of loose throw rugs in walkways, pet beds, electrical cords, etc?   yes      Grab bars in the bathroom? yes      Handrails on the stairs?   yes      Adequate lighting?   yes  Timed Get Up and Go performed:   Depression Screen PHQ 2/9 Scores 12/25/2017 07/27/2017 05/18/2017 11/22/2016  PHQ - 2  Score 0 0 5 0  PHQ- 9 Score - 2 16 -     Cognitive Function     6CIT Screen 12/25/2017  What Year? 0 points  What month? 0 points  What time? 0 points    Immunization History  Administered Date(s) Administered  . Influenza,inj,Quad PF,6+ Mos 01/09/2014, 11/26/2015, 05/18/2017  . Pneumococcal Conjugate-13 05/17/2016  . Pneumococcal Polysaccharide-23 05/18/2017  . Tdap 09/10/2013  . Zoster 10/20/2014  Qualifies for Shingles Vaccine? Ask insurance if covered   Screening Tests Health Maintenance  Topic Date Due  . INFLUENZA VACCINE  11/09/2017  . MAMMOGRAM  12/09/2018  . TETANUS/TDAP  09/11/2023  . COLONOSCOPY  04/06/2026  . DEXA SCAN  Completed  . Hepatitis C Screening  Completed  . PNA vac Low Risk Adult  Completed    Cancer Screenings: Lung: Low Dose CT Chest recommended if Age 55-80 years, 30 pack-year currently smoking OR have quit w/in 15years. Patient does not qualify. Breast:  Up to date on Mammogram? Yes   Up to date of Bone Density/Dexa? Yes Colorectal: up to date   Additional Screenings:  Hepatitis C Screening:      Plan:      I have personally reviewed and noted the following in the patient's chart:   . Medical and social history . Use of alcohol, tobacco or illicit drugs  . Current medications and supplements . Functional ability and status . Nutritional status . Physical activity . Advanced directives . List of other physicians . Hospitalizations, surgeries, and ER visits in previous 12 months . Vitals . Screenings to include cognitive, depression, and falls . Referrals and appointments  In addition, I have reviewed and discussed with patient certain preventive protocols, quality metrics, and best practice recommendations. A written personalized care plan for preventive services as well as general preventive health recommendations were provided to patient.     Kate Sable, LPN, LPN  [id number]

## 2017-12-25 NOTE — Patient Instructions (Signed)
Ms. Losh , Thank you for taking time to come for your Medicare Wellness Visit. I appreciate your ongoing commitment to your health goals. Please review the following plan we discussed and let me know if I can assist you in the future.   Screening recommendations/referrals: Colonoscopy: up to date  Mammogram: done today  Bone Density: up to date  Recommended yearly ophthalmology/optometry visit for glaucoma screening and checkup Recommended yearly dental visit for hygiene and checkup  Vaccinations: Influenza vaccine: Due  Pneumococcal vaccine: up to date  Tdap vaccine: up to date  Shingles vaccine: ask insurance if shingrix covered    Advanced directives: form given   Conditions/risks identified: done   Next appointment: scheduled    Preventive Care 40 Years and Older, Female Preventive care refers to lifestyle choices and visits with your health care provider that can promote health and wellness. What does preventive care include?  A yearly physical exam. This is also called an annual well check.  Dental exams once or twice a year.  Routine eye exams. Ask your health care provider how often you should have your eyes checked.  Personal lifestyle choices, including:  Daily care of your teeth and gums.  Regular physical activity.  Eating a healthy diet.  Avoiding tobacco and drug use.  Limiting alcohol use.  Practicing safe sex.  Taking low-dose aspirin every day.  Taking vitamin and mineral supplements as recommended by your health care provider. What happens during an annual well check? The services and screenings done by your health care provider during your annual well check will depend on your age, overall health, lifestyle risk factors, and family history of disease. Counseling  Your health care provider may ask you questions about your:  Alcohol use.  Tobacco use.  Drug use.  Emotional well-being.  Home and relationship well-being.  Sexual  activity.  Eating habits.  History of falls.  Memory and ability to understand (cognition).  Work and work Statistician.  Reproductive health. Screening  You may have the following tests or measurements:  Height, weight, and BMI.  Blood pressure.  Lipid and cholesterol levels. These may be checked every 5 years, or more frequently if you are over 88 years old.  Skin check.  Lung cancer screening. You may have this screening every year starting at age 24 if you have a 30-pack-year history of smoking and currently smoke or have quit within the past 15 years.  Fecal occult blood test (FOBT) of the stool. You may have this test every year starting at age 64.  Flexible sigmoidoscopy or colonoscopy. You may have a sigmoidoscopy every 5 years or a colonoscopy every 10 years starting at age 72.  Hepatitis C blood test.  Hepatitis B blood test.  Sexually transmitted disease (STD) testing.  Diabetes screening. This is done by checking your blood sugar (glucose) after you have not eaten for a while (fasting). You may have this done every 1-3 years.  Bone density scan. This is done to screen for osteoporosis. You may have this done starting at age 35.  Mammogram. This may be done every 1-2 years. Talk to your health care provider about how often you should have regular mammograms. Talk with your health care provider about your test results, treatment options, and if necessary, the need for more tests. Vaccines  Your health care provider may recommend certain vaccines, such as:  Influenza vaccine. This is recommended every year.  Tetanus, diphtheria, and acellular pertussis (Tdap, Td) vaccine. You may need  a Td booster every 10 years.  Zoster vaccine. You may need this after age 80.  Pneumococcal 13-valent conjugate (PCV13) vaccine. One dose is recommended after age 48.  Pneumococcal polysaccharide (PPSV23) vaccine. One dose is recommended after age 43. Talk to your health care  provider about which screenings and vaccines you need and how often you need them. This information is not intended to replace advice given to you by your health care provider. Make sure you discuss any questions you have with your health care provider. Document Released: 04/24/2015 Document Revised: 12/16/2015 Document Reviewed: 01/27/2015 Elsevier Interactive Patient Education  2017 [place] Prevention in the Home Falls can cause injuries. They can happen to people of all ages. There are many things you can do to make your home safe and to help prevent falls. What can I do on the outside of my home?  Regularly fix the edges of walkways and driveways and fix any cracks.  Remove anything that might make you trip as you walk through a door, such as a raised step or threshold.  Trim any bushes or trees on the path to your home.  Use bright outdoor lighting.  Clear any walking paths of anything that might make someone trip, such as rocks or tools.  Regularly check to see if handrails are loose or broken. Make sure that both sides of any steps have handrails.  Any raised decks and porches should have guardrails on the edges.  Have any leaves, snow, or ice cleared regularly.  Use sand or salt on walking paths during winter.  Clean up any spills in your garage right away. This includes oil or grease spills. What can I do in the bathroom?  Use night lights.  Install grab bars by the toilet and in the tub and shower. Do not use towel bars as grab bars.  Use non-skid mats or decals in the tub or shower.  If you need to sit down in the shower, use a plastic, non-slip stool.  Keep the floor dry. Clean up any water that spills on the floor as soon as it happens.  Remove soap buildup in the tub or shower regularly.  Attach bath mats securely with double-sided non-slip rug tape.  Do not have throw rugs and other things on the floor that can make you trip. What can I do in  the bedroom?  Use night lights.  Make sure that you have a light by your bed that is easy to reach.  Do not use any sheets or blankets that are too big for your bed. They should not hang down onto the floor.  Have a firm chair that has side arms. You can use this for support while you get dressed.  Do not have throw rugs and other things on the floor that can make you trip. What can I do in the kitchen?  Clean up any spills right away.  Avoid walking on wet floors.  Keep items that you use a lot in easy-to-reach places.  If you need to reach something above you, use a strong step stool that has a grab bar.  Keep electrical cords out of the way.  Do not use floor polish or wax that makes floors slippery. If you must use wax, use non-skid floor wax.  Do not have throw rugs and other things on the floor that can make you trip. What can I do with my stairs?  Do not leave any items on the  stairs.  Make sure that there are handrails on both sides of the stairs and use them. Fix handrails that are broken or loose. Make sure that handrails are as long as the stairways.  Check any carpeting to make sure that it is firmly attached to the stairs. Fix any carpet that is loose or worn.  Avoid having throw rugs at the top or bottom of the stairs. If you do have throw rugs, attach them to the floor with carpet tape.  Make sure that you have a light switch at the top of the stairs and the bottom of the stairs. If you do not have them, ask someone to add them for you. What else can I do to help prevent falls?  Wear shoes that:  Do not have high heels.  Have rubber bottoms.  Are comfortable and fit you well.  Are closed at the toe. Do not wear sandals.  If you use a stepladder:  Make sure that it is fully opened. Do not climb a closed stepladder.  Make sure that both sides of the stepladder are locked into place.  Ask someone to hold it for you, if possible.  Clearly mark and  make sure that you can see:  Any grab bars or handrails.  First and last steps.  Where the edge of each step is.  Use tools that help you move around (mobility aids) if they are needed. These include:  Canes.  Walkers.  Scooters.  Crutches.  Turn on the lights when you go into a dark area. Replace any light bulbs as soon as they burn out.  Set up your furniture so you have a clear path. Avoid moving your furniture around.  If any of your floors are uneven, fix them.  If there are any pets around you, be aware of where they are.  Review your medicines with your doctor. Some medicines can make you feel dizzy. This can increase your chance of falling. Ask your doctor what other things that you can do to help prevent falls. This information is not intended to replace advice given to you by your health care provider. Make sure you discuss any questions you have with your health care provider. Document Released: 01/22/2009 Document Revised: 09/03/2015 Document Reviewed: 05/02/2014 Elsevier Interactive Patient Education  2017 [employer].

## 2017-12-26 ENCOUNTER — Other Ambulatory Visit: Payer: Self-pay | Admitting: Family Medicine

## 2017-12-26 DIAGNOSIS — R928 Other abnormal and inconclusive findings on diagnostic imaging of breast: Secondary | ICD-10-CM

## 2017-12-29 ENCOUNTER — Ambulatory Visit (INDEPENDENT_AMBULATORY_CARE_PROVIDER_SITE_OTHER): Payer: Medicare HMO

## 2017-12-29 DIAGNOSIS — Z23 Encounter for immunization: Secondary | ICD-10-CM | POA: Diagnosis not present

## 2018-01-02 ENCOUNTER — Ambulatory Visit (HOSPITAL_COMMUNITY)
Admission: RE | Admit: 2018-01-02 | Discharge: 2018-01-02 | Disposition: A | Discharge status: Home / Self Care | Payer: Medicare HMO | Source: Ambulatory Visit | Attending: Family Medicine | Admitting: Family Medicine

## 2018-01-02 DIAGNOSIS — R928 Other abnormal and inconclusive findings on diagnostic imaging of breast: Secondary | ICD-10-CM

## 2018-01-02 DIAGNOSIS — N6001 Solitary cyst of right breast: Secondary | ICD-10-CM | POA: Diagnosis not present

## 2018-01-09 ENCOUNTER — Ambulatory Visit: Payer: Medicare HMO | Admitting: Family Medicine

## 2018-02-05 DIAGNOSIS — I10 Essential (primary) hypertension: Secondary | ICD-10-CM | POA: Diagnosis not present

## 2018-02-05 DIAGNOSIS — Z8249 Family history of ischemic heart disease and other diseases of the circulatory system: Secondary | ICD-10-CM | POA: Diagnosis not present

## 2018-02-05 DIAGNOSIS — Z7982 Long term (current) use of aspirin: Secondary | ICD-10-CM | POA: Diagnosis not present

## 2018-02-05 DIAGNOSIS — E669 Obesity, unspecified: Secondary | ICD-10-CM | POA: Diagnosis not present

## 2018-02-05 DIAGNOSIS — Z809 Family history of malignant neoplasm, unspecified: Secondary | ICD-10-CM | POA: Diagnosis not present

## 2018-02-05 DIAGNOSIS — Z6832 Body mass index (BMI) 32.0-32.9, adult: Secondary | ICD-10-CM | POA: Diagnosis not present

## 2018-03-10 ENCOUNTER — Other Ambulatory Visit: Payer: Self-pay | Admitting: Family Medicine

## 2018-03-12 ENCOUNTER — Other Ambulatory Visit: Payer: Self-pay

## 2018-03-12 ENCOUNTER — Telehealth: Payer: Self-pay | Admitting: *Deleted

## 2018-03-12 MED ORDER — TRIAMTERENE-HCTZ 37.5-25 MG PO TABS: 1.0000 | ORAL_TABLET | Freq: Every day | ORAL | 1 refills | Status: DC

## 2018-03-12 NOTE — Telephone Encounter (Signed)
Pt states she is out of her BP meds triamterene. She took her last pill Friday. Would like a refill called into [facility] in [place].

## 2018-03-12 NOTE — Telephone Encounter (Signed)
Medication refill sent to [facility] in [place] per patient request

## 2018-05-15 ENCOUNTER — Other Ambulatory Visit: Payer: Self-pay | Admitting: Family Medicine

## 2018-07-27 ENCOUNTER — Telehealth: Payer: Self-pay | Admitting: Family Medicine

## 2018-07-27 NOTE — Telephone Encounter (Signed)
lmom for pt to call and schedule annual physical via phone - [facility] form needs completion.

## 2018-07-31 ENCOUNTER — Encounter: Payer: Medicare HMO | Admitting: Family Medicine

## 2018-08-01 ENCOUNTER — Encounter: Payer: Self-pay | Admitting: Family Medicine

## 2018-08-01 ENCOUNTER — Ambulatory Visit (INDEPENDENT_AMBULATORY_CARE_PROVIDER_SITE_OTHER): Payer: Medicare HMO | Admitting: Family Medicine

## 2018-08-01 ENCOUNTER — Other Ambulatory Visit: Payer: Self-pay

## 2018-08-01 VITALS — BP 124/82 | HR 78 | Resp 15 | Ht 67.0 in | Wt 213.0 lb

## 2018-08-01 DIAGNOSIS — E559 Vitamin D deficiency, unspecified: Secondary | ICD-10-CM

## 2018-08-01 DIAGNOSIS — Z0000 Encounter for general adult medical examination without abnormal findings: Secondary | ICD-10-CM | POA: Diagnosis not present

## 2018-08-01 DIAGNOSIS — E876 Hypokalemia: Secondary | ICD-10-CM

## 2018-08-01 DIAGNOSIS — I10 Essential (primary) hypertension: Secondary | ICD-10-CM

## 2018-08-01 DIAGNOSIS — E663 Overweight: Secondary | ICD-10-CM

## 2018-08-01 NOTE — Patient Instructions (Addendum)
F/U in 6 months, call if you need me before.  Please commit to regular exercise and healthy food choice also eating in a disciplined way, youu have gained 13 pounds since Septemeber, and I know you will lose it in the next 6 months   Stop aspirin  Social distancing. Frequent hand washing with soap and water Keeping your hands off of your face.and  Use face mask outside of your home These 3 practices will help to keep both you and your community healthy during this time. Please practice them faithfully!   Fasting CBC, lipid, chem 7 and EGFr and vit D in the next week please  Thanks for choosing [facility], we consider it a privelige to serve you.

## 2018-08-02 DIAGNOSIS — I10 Essential (primary) hypertension: Secondary | ICD-10-CM | POA: Diagnosis not present

## 2018-08-02 DIAGNOSIS — E559 Vitamin D deficiency, unspecified: Secondary | ICD-10-CM | POA: Diagnosis not present

## 2018-08-02 DIAGNOSIS — E876 Hypokalemia: Secondary | ICD-10-CM | POA: Diagnosis not present

## 2018-08-03 ENCOUNTER — Encounter: Payer: Self-pay | Admitting: Family Medicine

## 2018-08-03 LAB — LIPID PANEL
Cholesterol: 164 mg/dL (ref <200)
HDL: 52 mg/dL (ref >=50)
LDL Cholesterol (Calc): 91 mg/dL (calc)
Non-HDL Cholesterol (Calc): 112 mg/dL (calc) (ref <130)
Total CHOL/HDL Ratio: 3.2 (calc) (ref <5.0)
Triglycerides: 110 mg/dL (ref <150)

## 2018-08-03 LAB — BASIC METABOLIC PANEL WITH GFR
BUN: 16 mg/dL (ref 7–25)
CO2: 28 mmol/L (ref 20–32)
Calcium: 9.3 mg/dL (ref 8.6–10.4)
Chloride: 104 mmol/L (ref 98–110)
Creat: 0.79 mg/dL (ref 0.50–0.99)
GFR, Est African American: 90 mL/min/{1.73_m2} (ref >=60)
GFR, Est Non African American: 77 mL/min/{1.73_m2} (ref >=60)
Glucose, Bld: 103 mg/dL — ABNORMAL HIGH (ref 65–99)
Potassium: 3.8 mmol/L (ref 3.5–5.3)
Sodium: 139 mmol/L (ref 135–146)

## 2018-08-03 LAB — CBC
HCT: 38.2 % (ref 35.0–45.0)
Hemoglobin: 12.6 g/dL (ref 11.7–15.5)
MCH: 26.5 pg — ABNORMAL LOW (ref 27.0–33.0)
MCHC: 33 g/dL (ref 32.0–36.0)
MCV: 80.4 fL (ref 80.0–100.0)
MPV: 9.8 fL (ref 7.5–12.5)
Platelets: 319 10*3/uL (ref 140–400)
RBC: 4.75 10*6/uL (ref 3.80–5.10)
RDW: 12.8 % (ref 11.0–15.0)
WBC: 7.6 10*3/uL (ref 3.8–10.8)

## 2018-08-03 LAB — VITAMIN D 25 HYDROXY (VIT D DEFICIENCY, FRACTURES): Vit D, 25-Hydroxy: 24 ng/mL — ABNORMAL LOW (ref 30–100)

## 2018-08-03 NOTE — Assessment & Plan Note (Signed)
Deteriorated.  Patient re-educated about  the importance of commitment to a  minimum of 150 minutes of exercise per week as able.  The importance of healthy food choices with portion control discussed, as well as eating regularly and within a 12 hour window most days. The need to choose "clean , [place]" food 50 to 75% of the time is discussed, as well as to make water the primary drink and set a goal of 64 ounces water daily.  Encouraged to start a food diary,  and to consider  joining a support group. Sample diet sheets offered. Goals set by the patient for the next several months.   Weight /BMI 08/01/2018 12/25/2017 07/27/2017  WEIGHT 213 lb 200 lb 205 lb  HEIGHT 5\' 7"  5\' 7"  5\' 7"   BMI 33.36 kg/m2 31.32 kg/m2 32.11 kg/m2

## 2018-08-03 NOTE — Assessment & Plan Note (Addendum)
Annual exam as documented. Counseling done  re healthy lifestyle involving commitment to 150 minutes exercise per week, heart healthy diet, and attaining healthy weight.The importance of adequate sleep also discussed. Changes in health habits are decided on by the patient with goals and time frames  set for achieving them. Immunization and cancer screening needs are specifically addressed at this visit. 

## 2018-08-03 NOTE — Progress Notes (Signed)
    Teresa Arellano     MRN: [id number]      DOB: 1951/03/28  HPI: Patient is in for annual physical exam. No other health concerns are expressed or addressed at the visit.    PE: BP 124/82   Pulse 78   Resp 15   Ht 5\' 7"  (1.702 m)   Wt 213 lb (96.6 kg)   SpO2 98%   BMI 33.36 kg/m   Pleasant  female, alert and oriented x 3, in no cardio-pulmonary distress. Afebrile. HEENT No facial trauma or asymetry. Sinuses non tender.  Extra occullar muscles intact,  light. External ears normal, tympanic membranes clear. Oropharynx moist, no exudate. Neck: supple, no adenopathy,JVD or thyromegaly.No bruits.  Chest: Clear to ascultation bilaterally.No crackles or wheezes. Non tender to palpation  Breast: Not examined  Cardiovascular system; Heart sounds normal,  S1 and  S2 ,no S3.  No murmur, or thrill. Apical beat not displaced Peripheral pulses normal.  Abdomen: Soft, non tender, no organomegaly or masses. No bruits. Bowel sounds normal. No guarding, tenderness or rebound.  GU: No exam indicated and pt is asymptomatic  Musculoskeletal exam: Full ROM of spine, hips , shoulders and knees. No deformity ,swelling or crepitus noted. No muscle wasting or atrophy.   Neurologic: Cranial nerves 2 to 12 intact. Power, tone ,sensation and reflexes normal throughout. No disturbance in gait. No tremor.  Skin: Intact, no ulceration, erythema , scaling or rash noted. Pigmentation normal throughout  Psych; Normal mood and affect. Judgement and concentration normal   Assessment & Plan:  Annual physical exam Annual exam as documented. Counseling done  re healthy lifestyle involving commitment to 150 minutes exercise per week, heart healthy diet, and attaining healthy weight.The importance of adequate sleep also discussed.  Changes in health habits are decided on by the patient with goals and time frames  set for achieving them. Immunization and cancer screening needs are  specifically addressed at this visit.   Overweight (BMI 25.0-29.9) Deteriorated.  Patient re-educated about  the importance of commitment to a  minimum of 150 minutes of exercise per week as able.  The importance of healthy food choices with portion control discussed, as well as eating regularly and within a 12 hour window most days. The need to choose "clean , [place]" food 50 to 75% of the time is discussed, as well as to make water the primary drink and set a goal of 64 ounces water daily.  Encouraged to start a food diary,  and to consider  joining a support group. Sample diet sheets offered. Goals set by the patient for the next several months.   Weight /BMI 08/01/2018 12/25/2017 07/27/2017  WEIGHT 213 lb 200 lb 205 lb  HEIGHT 5\' 7"  5\' 7"  5\' 7"   BMI 33.36 kg/m2 31.32 kg/m2 32.11 kg/m2

## 2018-08-20 ENCOUNTER — Other Ambulatory Visit: Payer: Self-pay | Admitting: Family Medicine

## 2018-11-26 ENCOUNTER — Other Ambulatory Visit: Payer: Self-pay | Admitting: Family Medicine

## 2018-12-24 ENCOUNTER — Other Ambulatory Visit: Payer: Self-pay

## 2018-12-24 ENCOUNTER — Ambulatory Visit (INDEPENDENT_AMBULATORY_CARE_PROVIDER_SITE_OTHER): Payer: Medicare HMO

## 2018-12-24 DIAGNOSIS — Z23 Encounter for immunization: Secondary | ICD-10-CM

## 2018-12-27 ENCOUNTER — Ambulatory Visit (INDEPENDENT_AMBULATORY_CARE_PROVIDER_SITE_OTHER): Payer: Medicare HMO | Admitting: Family Medicine

## 2018-12-27 ENCOUNTER — Ambulatory Visit: Payer: Medicare HMO | Admitting: Family Medicine

## 2018-12-27 ENCOUNTER — Other Ambulatory Visit: Payer: Self-pay

## 2018-12-27 ENCOUNTER — Encounter: Payer: Self-pay | Admitting: Family Medicine

## 2018-12-27 VITALS — BP 124/82 | HR 78 | Resp 15 | Ht 67.0 in | Wt 213.0 lb

## 2018-12-27 DIAGNOSIS — Z0000 Encounter for general adult medical examination without abnormal findings: Secondary | ICD-10-CM

## 2018-12-27 NOTE — Progress Notes (Signed)
Subjective:   Teresa Arellano is a 68 y.o. female who presents for Medicare Annual (Subsequent) preventive examination.  Location of Patient: Home Location of Provider: Telehealth Consent was obtain for visit to be over via telehealth. I verified that I am speaking with the correct person using two identifiers.   Review of Systems:    Cardiac Risk Factors include: advanced age (>41men, >47 women);hypertension;obesity (BMI >30kg/m2)     Objective:     Vitals: BP 124/82   Pulse 78   Resp 15   Ht 5\' 7"  (1.702 m)   Wt 213 lb (96.6 kg)   BMI 33.36 kg/m   Body mass index is 33.36 kg/m.  Advanced Directives 12/25/2017 08/24/2016 05/23/2016 05/02/2016 04/27/2016 04/06/2016  Does Patient Have a Medical Advance Directive? No No No No No No  Would patient like information on creating a medical advance directive? Yes (ED - Information included in AVS) - No - Patient declined No - Patient declined No - Patient declined No - Patient declined    Tobacco Social History   Tobacco Use  Smoking Status Never Smoker  Smokeless Tobacco Never Used     Counseling given: Yes   Clinical Intake:  Pre-visit preparation completed: Yes  Pain : No/denies pain Pain Score: 0-No pain     BMI - recorded: 33.36 Nutritional Status: BMI > 30  Obese Nutritional Risks: None Diabetes: No  How often do you need to have someone help you when you read instructions, pamphlets, or other written materials from your doctor or pharmacy?: 1 - Never What is the last grade level you completed in school?: Associates Degree  Interpreter Needed?: No     Past Medical History:  Diagnosis Date  . Cataract   . Hypertension 2014   Past Surgical History:  Procedure Laterality Date  . ABDOMINAL HYSTERECTOMY  1982   partial, fibroids  . CATARACT EXTRACTION W/PHACO Right 05/02/2016   Procedure: CATARACT EXTRACTION PHACO AND INTRAOCULAR LENS PLACEMENT (IOC);  Surgeon: Tonny Branch, MD;  Location: [facility];  Service:  Ophthalmology;  Laterality: Right;  CDE: 5.35  . CATARACT EXTRACTION W/PHACO Left 05/23/2016   Procedure: CATARACT EXTRACTION PHACO AND INTRAOCULAR LENS PLACEMENT LEFT EYE;  Surgeon: Tonny Branch, MD;  Location: [facility];  Service: Ophthalmology;  Laterality: Left;  CDE:  5.45  . CHOLECYSTECTOMY  1991 approx  . COLONOSCOPY N/A 04/06/2016   Procedure: COLONOSCOPY;  Surgeon: Danie Binder, MD;  Location: [facility];  Service: Endoscopy;  Laterality: N/A;  10:30 Am  . POLYPECTOMY  04/06/2016   Procedure: POLYPECTOMY;  Surgeon: Danie Binder, MD;  Location: [facility];  Service: Endoscopy;;  hepatic flexure polypectomy;  . TUBAL LIGATION  1979   Family History  Problem Relation Age of Onset  . Heart disease Mother   . Diabetes Father   . Heart disease Brother   . Cancer Sister 76       breast   . Cancer Sister        stomach   . Heart disease Brother   . Hypertension Sister   . Hypertension Sister   . Diabetes Sister   . Diabetes Sister    Social History   Socioeconomic History  . Marital status: Married    Spouse name: Not on file  . Number of children: Not on file  . Years of education: college   . Highest education level: Associate degree: academic program  Occupational History  . Not on file  Social Needs  .  Financial resource strain: Not hard at all  . Food insecurity    Worry: Never true    Inability: Never true  . Transportation needs    Medical: No    Non-medical: No  Tobacco Use  . Smoking status: Never Smoker  . Smokeless tobacco: Never Used  Substance and Sexual Activity  . Alcohol use: No  . Drug use: No  . Sexual activity: Yes    Birth control/protection: Surgical  Lifestyle  . Physical activity    Days per week: 6 days    Minutes per session: 60 min  . Stress: Not at all  Relationships  . Social connections    Talks on phone: More than three times a week    Gets together: More than three times a week    Attends religious service: More than 4  times per year    Active member of club or organization: Yes    Attends meetings of clubs or organizations: More than 4 times per year    Relationship status: Married  Other Topics Concern  . Not on file  Social History Narrative  . Not on file    Outpatient Encounter Medications as of 12/27/2018  Medication Sig  . potassium chloride (K-DUR) 10 MEQ tablet TAKE ONE TABLET BY MOUTH ONCE DAILY  . triamterene-hydrochlorothiazide (MAXZIDE-25) 37.5-25 MG tablet Take 1 tablet by mouth daily.   No facility-administered encounter medications on file as of 12/27/2018.     Activities of Daily Living In your present state of health, do you have any difficulty performing the following activities: 12/27/2018  Hearing? N  Vision? N  Difficulty concentrating or making decisions? N  Walking or climbing stairs? N  Dressing or bathing? N  Doing errands, shopping? N  Preparing Food and eating ? N  Using the Toilet? N  In the past six months, have you accidently leaked urine? N  Do you have problems with loss of bowel control? N  Managing your Medications? N  Managing your Finances? N  Housekeeping or managing your Housekeeping? N  Some recent data might be hidden    Patient Care Team: Fayrene Helper, MD as PCP - General (Family Medicine)    Assessment:   This is a routine wellness examination for Teresa Arellano.  Exercise Activities and Dietary recommendations Current Exercise Habits: Home exercise routine, Type of exercise: treadmill, Time (Minutes): 60, Frequency (Times/Week): 5, Weekly Exercise (Minutes/Week): 300, Intensity: Moderate, Exercise limited by: None identified  Goals   None     Fall Risk Fall Risk  12/27/2018 08/01/2018 12/25/2017 05/18/2017 11/22/2016  Falls in the past year? 0 0 No No No  Number falls in past yr: 0 0 - - -  Injury with Fall? 0 0 - - -   Is the patient's home free of loose throw rugs in walkways, pet beds, electrical cords, etc?   yes      Grab bars in the  bathroom? no      Handrails on the stairs?   yes      Adequate lighting?   yes     Depression Screen PHQ 2/9 Scores 12/27/2018 08/01/2018 12/25/2017 07/27/2017  PHQ - 2 Score 0 0 0 0  PHQ- 9 Score - - - 2     Cognitive Function     6CIT Screen 12/27/2018 12/25/2017  What Year? 0 points 0 points  What month? 0 points 0 points  What time? 0 points 0 points  Count back from 20 0 points -  Months in reverse 0 points -  Repeat phrase 0 points -  Total Score 0 -    Immunization History  Administered Date(s) Administered  . Fluad Quad(high Dose 65+) 12/24/2018  . Influenza,inj,Quad PF,6+ Mos 01/09/2014, 11/26/2015, 05/18/2017, 12/29/2017  . Pneumococcal Conjugate-13 05/17/2016  . Pneumococcal Polysaccharide-23 05/18/2017  . Tdap 09/10/2013  . Zoster 10/20/2014    Qualifies for Shingles Vaccine? completed  Screening Tests Health Maintenance  Topic Date Due  . MAMMOGRAM  12/26/2019  . TETANUS/TDAP  09/11/2023  . COLONOSCOPY  04/06/2026  . INFLUENZA VACCINE  Completed  . DEXA SCAN  Completed  . Hepatitis C Screening  Completed  . PNA vac Low Risk Adult  Completed    Cancer Screenings: Lung: Low Dose CT Chest recommended if Age 49-80 years, 30 pack-year currently smoking OR have quit w/in 15years. Patient does not qualify. Breast:  Up to date on Mammogram? Yes   Up to date of Bone Density/Dexa? Yes Colorectal:  Due 2027  Additional Screenings:   Hepatitis C Screening:  completed     Plan:       1. Encounter for Medicare annual wellness exam  I have personally reviewed and noted the following in the patient's chart:   . Medical and social history . Use of alcohol, tobacco or illicit drugs  . Current medications and supplements . Functional ability and status . Nutritional status . Physical activity . Advanced directives . List of other physicians . Hospitalizations, surgeries, and ER visits in previous 12 months . Vitals . Screenings to include cognitive,  depression, and falls . Referrals and appointments  In addition, I have reviewed and discussed with patient certain preventive protocols, quality metrics, and best practice recommendations. A written personalized care plan for preventive services as well as general preventive health recommendations were provided to patient.     I provided 20 minutes of non-face-to-face time during this encounter.    Perlie Mayo, NP  12/27/2018

## 2018-12-27 NOTE — Patient Instructions (Signed)
Teresa Arellano , Thank you for taking time to come for your Medicare Wellness Visit. I appreciate your ongoing commitment to your health goals. Please review the following plan we discussed and let me know if I can assist you in the future.   Please continue to practice social distancing to keep you, your family, and our community safe.  If you must go out, please wear a Mask and practice good handwashing.  Screening recommendations/referrals: Colonoscopy: Up to date Mammogram: Up to date Bone Density: Completed Recommended yearly ophthalmology/optometry visit for glaucoma screening and checkup Recommended yearly dental visit for hygiene and checkup  Vaccinations: Influenza vaccine: Completed Pneumococcal vaccine: Completed Tdap vaccine: Due 2025 Shingles vaccine: Completed  Advanced directives: Decline info  Conditions/risks identified: Falls   Next appointment: 01/30/2019   Preventive Care 30 Years and Older, Female Preventive care refers to lifestyle choices and visits with your health care provider that can promote health and wellness. What does preventive care include?  A yearly physical exam. This is also called an annual well check.  Dental exams once or twice a year.  Routine eye exams. Ask your health care provider how often you should have your eyes checked.  Personal lifestyle choices, including:  Daily care of your teeth and gums.  Regular physical activity.  Eating a healthy diet.  Avoiding tobacco and drug use.  Limiting alcohol use.  Practicing safe sex.  Taking low-dose aspirin every day.  Taking vitamin and mineral supplements as recommended by your health care provider. What happens during an annual well check? The services and screenings done by your health care provider during your annual well check will depend on your age, overall health, lifestyle risk factors, and family history of disease. Counseling  Your health care provider may ask you  questions about your:  Alcohol use.  Tobacco use.  Drug use.  Emotional well-being.  Home and relationship well-being.  Sexual activity.  Eating habits.  History of falls.  Memory and ability to understand (cognition).  Work and work Statistician.  Reproductive health. Screening  You may have the following tests or measurements:  Height, weight, and BMI.  Blood pressure.  Lipid and cholesterol levels. These may be checked every 5 years, or more frequently if you are over 44 years old.  Skin check.  Lung cancer screening. You may have this screening every year starting at age 71 if you have a 30-pack-year history of smoking and currently smoke or have quit within the past 15 years.  Fecal occult blood test (FOBT) of the stool. You may have this test every year starting at age 80.  Flexible sigmoidoscopy or colonoscopy. You may have a sigmoidoscopy every 5 years or a colonoscopy every 10 years starting at age 31.  Hepatitis C blood test.  Hepatitis B blood test.  Sexually transmitted disease (STD) testing.  Diabetes screening. This is done by checking your blood sugar (glucose) after you have not eaten for a while (fasting). You may have this done every 1-3 years.  Bone density scan. This is done to screen for osteoporosis. You may have this done starting at age 3.  Mammogram. This may be done every 1-2 years. Talk to your health care provider about how often you should have regular mammograms. Talk with your health care provider about your test results, treatment options, and if necessary, the need for more tests. Vaccines  Your health care provider may recommend certain vaccines, such as:  Influenza vaccine. This is recommended every year.  Tetanus, diphtheria, and acellular pertussis (Tdap, Td) vaccine. You may need a Td booster every 10 years.  Zoster vaccine. You may need this after age 31.  Pneumococcal 13-valent conjugate (PCV13) vaccine. One dose is  recommended after age 15.  Pneumococcal polysaccharide (PPSV23) vaccine. One dose is recommended after age 79. Talk to your health care provider about which screenings and vaccines you need and how often you need them. This information is not intended to replace advice given to you by your health care provider. Make sure you discuss any questions you have with your health care provider. Document Released: 04/24/2015 Document Revised: 12/16/2015 Document Reviewed: 01/27/2015 Elsevier Interactive Patient Education  2017 [place] Prevention in the Home Falls can cause injuries. They can happen to people of all ages. There are many things you can do to make your home safe and to help prevent falls. What can I do on the outside of my home?  Regularly fix the edges of walkways and driveways and fix any cracks.  Remove anything that might make you trip as you walk through a door, such as a raised step or threshold.  Trim any bushes or trees on the path to your home.  Use bright outdoor lighting.  Clear any walking paths of anything that might make someone trip, such as rocks or tools.  Regularly check to see if handrails are loose or broken. Make sure that both sides of any steps have handrails.  Any raised decks and porches should have guardrails on the edges.  Have any leaves, snow, or ice cleared regularly.  Use sand or salt on walking paths during winter.  Clean up any spills in your garage right away. This includes oil or grease spills. What can I do in the bathroom?  Use night lights.  Install grab bars by the toilet and in the tub and shower. Do not use towel bars as grab bars.  Use non-skid mats or decals in the tub or shower.  If you need to sit down in the shower, use a plastic, non-slip stool.  Keep the floor dry. Clean up any water that spills on the floor as soon as it happens.  Remove soap buildup in the tub or shower regularly.  Attach bath mats  securely with double-sided non-slip rug tape.  Do not have throw rugs and other things on the floor that can make you trip. What can I do in the bedroom?  Use night lights.  Make sure that you have a light by your bed that is easy to reach.  Do not use any sheets or blankets that are too big for your bed. They should not hang down onto the floor.  Have a firm chair that has side arms. You can use this for support while you get dressed.  Do not have throw rugs and other things on the floor that can make you trip. What can I do in the kitchen?  Clean up any spills right away.  Avoid walking on wet floors.  Keep items that you use a lot in easy-to-reach places.  If you need to reach something above you, use a strong step stool that has a grab bar.  Keep electrical cords out of the way.  Do not use floor polish or wax that makes floors slippery. If you must use wax, use non-skid floor wax.  Do not have throw rugs and other things on the floor that can make you trip. What can I do  with my stairs?  Do not leave any items on the stairs.  Make sure that there are handrails on both sides of the stairs and use them. Fix handrails that are broken or loose. Make sure that handrails are as long as the stairways.  Check any carpeting to make sure that it is firmly attached to the stairs. Fix any carpet that is loose or worn.  Avoid having throw rugs at the top or bottom of the stairs. If you do have throw rugs, attach them to the floor with carpet tape.  Make sure that you have a light switch at the top of the stairs and the bottom of the stairs. If you do not have them, ask someone to add them for you. What else can I do to help prevent falls?  Wear shoes that:  Do not have high heels.  Have rubber bottoms.  Are comfortable and fit you well.  Are closed at the toe. Do not wear sandals.  If you use a stepladder:  Make sure that it is fully opened. Do not climb a closed  stepladder.  Make sure that both sides of the stepladder are locked into place.  Ask someone to hold it for you, if possible.  Clearly mark and make sure that you can see:  Any grab bars or handrails.  First and last steps.  Where the edge of each step is.  Use tools that help you move around (mobility aids) if they are needed. These include:  Canes.  Walkers.  Scooters.  Crutches.  Turn on the lights when you go into a dark area. Replace any light bulbs as soon as they burn out.  Set up your furniture so you have a clear path. Avoid moving your furniture around.  If any of your floors are uneven, fix them.  If there are any pets around you, be aware of where they are.  Review your medicines with your doctor. Some medicines can make you feel dizzy. This can increase your chance of falling. Ask your doctor what other things that you can do to help prevent falls. This information is not intended to replace advice given to you by your health care provider. Make sure you discuss any questions you have with your health care provider. Document Released: 01/22/2009 Document Revised: 09/03/2015 Document Reviewed: 05/02/2014 Elsevier Interactive Patient Education  2017 [employer].

## 2019-01-29 ENCOUNTER — Encounter: Payer: Self-pay | Admitting: Family Medicine

## 2019-01-29 ENCOUNTER — Ambulatory Visit (INDEPENDENT_AMBULATORY_CARE_PROVIDER_SITE_OTHER): Payer: Medicare HMO | Admitting: Family Medicine

## 2019-01-29 ENCOUNTER — Other Ambulatory Visit: Payer: Self-pay

## 2019-01-29 VITALS — BP 124/82 | Ht 67.0 in | Wt 213.0 lb

## 2019-01-29 DIAGNOSIS — Z13220 Encounter for screening for lipoid disorders: Secondary | ICD-10-CM | POA: Diagnosis not present

## 2019-01-29 DIAGNOSIS — M25561 Pain in right knee: Secondary | ICD-10-CM | POA: Diagnosis not present

## 2019-01-29 DIAGNOSIS — I10 Essential (primary) hypertension: Secondary | ICD-10-CM

## 2019-01-29 DIAGNOSIS — E559 Vitamin D deficiency, unspecified: Secondary | ICD-10-CM

## 2019-01-29 DIAGNOSIS — M25569 Pain in unspecified knee: Secondary | ICD-10-CM | POA: Insufficient documentation

## 2019-01-29 DIAGNOSIS — E663 Overweight: Secondary | ICD-10-CM | POA: Diagnosis not present

## 2019-01-29 MED ORDER — POTASSIUM CHLORIDE CRYS ER 10 MEQ PO TBCR: 10.0000 meq | EXTENDED_RELEASE_TABLET | Freq: Every day | ORAL | 1 refills | Status: DC

## 2019-01-29 MED ORDER — IBUPROFEN 800 MG PO TABS: 800.0000 mg | ORAL_TABLET | Freq: Three times a day (TID) | ORAL | 0 refills | Status: AC

## 2019-01-29 MED ORDER — PREDNISONE 10 MG PO TABS: 10.0000 mg | ORAL_TABLET | Freq: Two times a day (BID) | ORAL | 0 refills | Status: AC

## 2019-01-29 MED ORDER — TRIAMTERENE-HCTZ 37.5-25 MG PO TABS: 1.0000 | ORAL_TABLET | Freq: Every day | ORAL | 1 refills | Status: DC

## 2019-01-29 NOTE — Assessment & Plan Note (Signed)
2 week history, short antio inflammatory course ( 5 days) , call back for Ortho appt if persists, historically may be a Baker's cyst

## 2019-01-29 NOTE — Assessment & Plan Note (Signed)
Controlled, no change in medication   DASH diet and commitment to daily physical activity for a minimum of 30 minutes discussed and encouraged, as a part of hypertension management. The importance of attaining a healthy weight is also discussed.  BP/Weight 01/29/2019 12/27/2018 08/01/2018 12/25/2017 07/27/2017 05/18/2017 [id number]  Systolic BP [id number] [id number] [id number] [id number]  Diastolic BP 82 82 82 80 82 82 82  Wt. (Lbs) 213 213 213 200 205 207 198.12  BMI 33.36 33.36 33.36 31.32 32.11 32.42 31.03

## 2019-01-29 NOTE — Patient Instructions (Addendum)
Annual physical exam in office  with MD in mid  March 2021, call if you need me sooner Please schedule mammogram and mail to patient, thanks Short course of ibuprofen and prednisone are  prescribed for right knee pain, if this persists, please call, I will refer you to Orthopedics  Fasting cBC, lipid, chem 7 and EGFr, TSH and vit D,first week in March 10 to 12 pound weight loss is the goal  It is important that you exercise regularly at least 30 minutes 5 times a week. If you develop chest pain, have severe difficulty breathing, or feel very tired, stop exercising immediately and seek medical attention  Think about what you will eat, plan ahead. Choose " clean, [place], fresh or frozen" over canned, processed or packaged foods which are more sugary, salty and fatty. 70 to 75% of food eaten should be vegetables and fruit. Three meals at set times with snacks allowed between meals, but they must be fruit or vegetables. Aim to eat over a 12 hour period , example 7 am to 7 pm, and STOP after  your last meal of the day. Drink water,generally about 64 ounces per day, no other drink is as healthy. Fruit juice is best enjoyed in a healthy way, by EATING the fruit.  

## 2019-01-29 NOTE — Progress Notes (Signed)
Virtual Visit via Telephone Note  I connected with Teresa Arellano on 01/29/19 at  3:20 PM EDT by telephone and verified that I am speaking with the correct person using two identifiers.  Location: Patient: home Provider: office   I discussed the limitations, risks, security and privacy concerns of performing an evaluation and management service by telephone and the availability of in person appointments. I also discussed with the patient that there may be a patient responsible charge related to this service. The patient expressed understanding and agreed to proceed.   History of Present Illness: 2 week h/o daily right leg pain starting behind the knee where there is a swelling, non radiaiting , hurts when standing or walking, no trauma to knee, rated at a 6 Denies recent fever or chills. Denies sinus pressure, nasal congestion, ear pain or sore throat. Denies chest congestion, productive cough or wheezing. Denies chest pains, palpitations and leg swelling Denies abdominal pain, nausea, vomiting,diarrhea or constipation.   Denies dysuria, frequency, hesitancy or incontinence.  Denies headaches, seizures, numbness, or tingling. Denies depression, anxiety or insomnia. Denies skin break down or rash.     Observations/Objective: BP 124/82   Ht 5\' 7"  (1.702 m)   Wt 213 lb (96.6 kg)   BMI 33.36 kg/m  Good communication with no confusion and intact memory. Alert and oriented x 3 No signs of respiratory distress during speech     Assessment and Plan: Essential hypertension, benign Controlled, no change in medication   DASH diet and commitment to daily physical activity for a minimum of 30 minutes discussed and encouraged, as a part of hypertension management. The importance of attaining a healthy weight is also discussed.  BP/Weight 01/29/2019 12/27/2018 08/01/2018 12/25/2017 07/27/2017 05/18/2017 123456  Systolic BP [id number] A999333 A999333 123456 123456 [id number]  Diastolic BP 82 82 82 80 82 82 82   Wt. (Lbs) 213 213 213 200 205 207 198.12  BMI 33.36 33.36 33.36 31.32 32.11 32.42 31.03       Knee pain 2 week history, short antio inflammatory course ( 5 days) , call back for Ortho appt if persists, historically may be a Baker's cyst  Overweight (BMI 25.0-29.9)  Patient re-educated about  the importance of commitment to a  minimum of 150 minutes of exercise per week as able.  The importance of healthy food choices with portion control discussed, as well as eating regularly and within a 12 hour window most days. The need to choose "clean , [place]" food 50 to 75% of the time is discussed, as well as to make water the primary drink and set a goal of 64 ounces water daily.    Weight /BMI 01/29/2019 12/27/2018 08/01/2018  WEIGHT 213 lb 213 lb 213 lb  HEIGHT 5\' 7"  5\' 7"  5\' 7"   BMI 33.36 kg/m2 33.36 kg/m2 33.36 kg/m2        Follow Up Instructions:    I discussed the assessment and treatment plan with the patient. The patient was provided an opportunity to ask questions and all were answered. The patient agreed with the plan and demonstrated an understanding of the instructions.   The patient was advised to call back or seek an in-person evaluation if the symptoms worsen or if the condition fails to improve as anticipated.  I provided 21 minutes of non-face-to-face time during this encounter.   Tula Nakayama, MD

## 2019-01-30 ENCOUNTER — Other Ambulatory Visit (HOSPITAL_COMMUNITY): Payer: Self-pay | Admitting: Family Medicine

## 2019-01-30 ENCOUNTER — Ambulatory Visit: Payer: Medicare HMO | Admitting: Family Medicine

## 2019-01-30 DIAGNOSIS — Z1231 Encounter for screening mammogram for malignant neoplasm of breast: Secondary | ICD-10-CM

## 2019-02-01 NOTE — Assessment & Plan Note (Signed)
  Patient re-educated about  the importance of commitment to a  minimum of 150 minutes of exercise per week as able.  The importance of healthy food choices with portion control discussed, as well as eating regularly and within a 12 hour window most days. The need to choose "clean , [place]" food 50 to 75% of the time is discussed, as well as to make water the primary drink and set a goal of 64 ounces water daily.    Weight /BMI 01/29/2019 12/27/2018 08/01/2018  WEIGHT 213 lb 213 lb 213 lb  HEIGHT 5\' 7"  5\' 7"  5\' 7"   BMI 33.36 kg/m2 33.36 kg/m2 33.36 kg/m2

## 2019-02-11 ENCOUNTER — Ambulatory Visit (HOSPITAL_COMMUNITY)
Admission: RE | Admit: 2019-02-11 | Discharge: 2019-02-11 | Disposition: A | Discharge status: Home / Self Care | Payer: Medicare HMO | Source: Ambulatory Visit | Attending: Family Medicine | Admitting: Family Medicine

## 2019-02-11 ENCOUNTER — Other Ambulatory Visit: Payer: Self-pay

## 2019-02-11 DIAGNOSIS — Z1231 Encounter for screening mammogram for malignant neoplasm of breast: Secondary | ICD-10-CM | POA: Diagnosis not present

## 2019-02-22 DIAGNOSIS — E876 Hypokalemia: Secondary | ICD-10-CM | POA: Diagnosis not present

## 2019-02-22 DIAGNOSIS — Z809 Family history of malignant neoplasm, unspecified: Secondary | ICD-10-CM | POA: Diagnosis not present

## 2019-02-22 DIAGNOSIS — Z823 Family history of stroke: Secondary | ICD-10-CM | POA: Diagnosis not present

## 2019-02-22 DIAGNOSIS — E669 Obesity, unspecified: Secondary | ICD-10-CM | POA: Diagnosis not present

## 2019-02-22 DIAGNOSIS — I10 Essential (primary) hypertension: Secondary | ICD-10-CM | POA: Diagnosis not present

## 2019-02-22 DIAGNOSIS — G8929 Other chronic pain: Secondary | ICD-10-CM | POA: Diagnosis not present

## 2019-02-22 DIAGNOSIS — Z803 Family history of malignant neoplasm of breast: Secondary | ICD-10-CM | POA: Diagnosis not present

## 2019-02-22 DIAGNOSIS — R69 Illness, unspecified: Secondary | ICD-10-CM | POA: Diagnosis not present

## 2019-02-22 DIAGNOSIS — Z8249 Family history of ischemic heart disease and other diseases of the circulatory system: Secondary | ICD-10-CM | POA: Diagnosis not present

## 2019-02-26 ENCOUNTER — Telehealth: Payer: Self-pay

## 2019-02-26 DIAGNOSIS — M25561 Pain in right knee: Secondary | ICD-10-CM

## 2019-02-26 NOTE — Telephone Encounter (Signed)
Pt is calling she is still hurting in her leg above her knee, hurts to walk at times (rt leg)

## 2019-02-26 NOTE — Telephone Encounter (Signed)
Treated 10/20, I recommend Ortho eval ( of her choice) please refer , I will sign

## 2019-02-26 NOTE — Telephone Encounter (Signed)
Spoke with patient. Advised her she needed to see ortho. Patient in agreement with treatment plan. Referral placed

## 2019-03-11 ENCOUNTER — Encounter: Payer: Self-pay | Admitting: Family Medicine

## 2019-03-11 ENCOUNTER — Other Ambulatory Visit: Payer: Self-pay

## 2019-03-11 ENCOUNTER — Ambulatory Visit (INDEPENDENT_AMBULATORY_CARE_PROVIDER_SITE_OTHER): Payer: Medicare HMO | Admitting: Family Medicine

## 2019-03-11 VITALS — BP 120/92 | HR 103 | Resp 15 | Ht 66.0 in | Wt 212.0 lb

## 2019-03-11 DIAGNOSIS — G8929 Other chronic pain: Secondary | ICD-10-CM | POA: Diagnosis not present

## 2019-03-11 DIAGNOSIS — K089 Disorder of teeth and supporting structures, unspecified: Secondary | ICD-10-CM | POA: Diagnosis not present

## 2019-03-11 DIAGNOSIS — I10 Essential (primary) hypertension: Secondary | ICD-10-CM | POA: Diagnosis not present

## 2019-03-11 MED ORDER — CEPHALEXIN 500 MG PO CAPS: 500.0000 mg | ORAL_CAPSULE | Freq: Three times a day (TID) | ORAL | 0 refills | Status: DC

## 2019-03-11 MED ORDER — IBUPROFEN 800 MG PO TABS: ORAL_TABLET | ORAL | 0 refills | Status: DC

## 2019-03-11 NOTE — Patient Instructions (Signed)
Reschedule Annual physical exam with MD to April 23 or after, not due in March, call if you need me before  Keflex, antibiotic is prescribed take as directed entire course. Ibuprofen for pain, if 2 r tablets daily control pain, then do not take 3  It is important that you exercise regularly at least 30 minutes 5 times a week. If you develop chest pain, have severe difficulty breathing, or feel very tired, stop exercising immediately and seek medical attention    Blood pressure slightly high, pleas reduce salt and sodium intake

## 2019-03-11 NOTE — Progress Notes (Signed)
   Teresa Arellano     MRN: [id number]      DOB: 29-Jun-1950   HPI Teresa Arellano is here with a 5 day h/o dental pain, infected tooth right mandibula area, has appt next week, pain is 10 plus and she is unable to eat as a result   ROS Denies recent fever or chills. Denies sinus pressure, nasal congestion, ear pain or sore throat. Denies chest congestion, productive cough or wheezing. Denies chest pains, palpitations and leg swelling Denies abdominal pain, nausea, vomiting,diarrhea or constipation.   Denies dysuria, frequency, hesitancy or incontinence Denies  seizures, numbness, or tingling. Denies depression, anxiety or insomnia. Denies skin break down or rash.   PE  BP (!) 120/92   Pulse (!) 103   Resp 15   Ht 5\' 6"  (1.676 m)   Wt 212 lb 0.6 oz (96.2 kg)   SpO2 98%   BMI 34.22 kg/m   Patient alert and oriented and in no cardiopulmonary distress.Pt in pain  HEENT: No facial asymmetry, EOMI,     Neck supple .Tender over right mandible  Chest: Clear to auscultation bilaterally.  CVS: S1, S2 no murmurs, no S3.Regular rate.   Ext: No edema   Assessment & Plan  Pain, dental Keflex and ibuprofen prescribed and pt advised of the importance of f/u with dentisit  Essential hypertension, benign Controlled, no change in medication DASH diet and commitment to daily physical activity for a minimum of 30 minutes discussed and encouraged, as a part of hypertension management. The importance of attaining a healthy weight is also discussed.  BP/Weight 03/11/2019 01/29/2019 12/27/2018 08/01/2018 12/25/2017 Q000111Q Q000111Q  Systolic BP 123456 A999333 A999333 A999333 123456 123456 123456  Diastolic BP 92 82 82 82 80 82 82  Wt. (Lbs) 212.04 213 213 213 200 205 207  BMI 34.22 33.36 33.36 33.36 31.32 32.11 32.42

## 2019-03-13 ENCOUNTER — Encounter: Payer: Self-pay | Admitting: Family Medicine

## 2019-03-13 DIAGNOSIS — K0889 Other specified disorders of teeth and supporting structures: Secondary | ICD-10-CM | POA: Insufficient documentation

## 2019-03-13 NOTE — Assessment & Plan Note (Signed)
Controlled, no change in medication DASH diet and commitment to daily physical activity for a minimum of 30 minutes discussed and encouraged, as a part of hypertension management. The importance of attaining a healthy weight is also discussed.  BP/Weight 03/11/2019 01/29/2019 12/27/2018 08/01/2018 12/25/2017 Q000111Q Q000111Q  Systolic BP 123456 A999333 A999333 A999333 123456 123456 123456  Diastolic BP 92 82 82 82 80 82 82  Wt. (Lbs) 212.04 213 213 213 200 205 207  BMI 34.22 33.36 33.36 33.36 31.32 32.11 32.42

## 2019-03-13 NOTE — Assessment & Plan Note (Signed)
Keflex and ibuprofen prescribed and pt advised of the importance of f/u with dentisit

## 2019-03-14 ENCOUNTER — Ambulatory Visit: Payer: Medicare HMO

## 2019-03-14 ENCOUNTER — Other Ambulatory Visit: Payer: Self-pay

## 2019-03-14 ENCOUNTER — Encounter: Payer: Self-pay | Admitting: Orthopaedic Surgery

## 2019-03-14 ENCOUNTER — Ambulatory Visit (INDEPENDENT_AMBULATORY_CARE_PROVIDER_SITE_OTHER): Payer: Medicare HMO | Admitting: Orthopaedic Surgery

## 2019-03-14 VITALS — BP 125/80 | HR 89 | Ht 66.0 in | Wt 217.0 lb

## 2019-03-14 DIAGNOSIS — M25561 Pain in right knee: Secondary | ICD-10-CM | POA: Diagnosis not present

## 2019-03-14 NOTE — Progress Notes (Signed)
Subjective:    Patient ID: Teresa Arellano, female    DOB: June 28, 1950, 68 y.o.   MRN: [id number]  HPI She has been having pain of the right knee for several months getting slowly worse. She has no trauma, no redness, no giving way. She has swelling and popping.  She has no other joint pain. Tylenol, rest and ice have helped slightly.   Review of Systems  Constitutional: Positive for activity change.  Musculoskeletal: Positive for gait problem and joint swelling.  All other systems reviewed and are negative.  For Review of Systems, all other systems reviewed and are negative.  The following is a summary of the past history medically, past history surgically, known current medicines, social history and family history.  This information is gathered electronically by the computer from prior information and documentation.  I review this each visit and have found including this information at this point in the chart is beneficial and informative.   Past Medical History:  Diagnosis Date  . Cataract   . Hypertension 2014    Past Surgical History:  Procedure Laterality Date  . ABDOMINAL HYSTERECTOMY  1982   partial, fibroids  . CATARACT EXTRACTION W/PHACO Right 05/02/2016   Procedure: CATARACT EXTRACTION PHACO AND INTRAOCULAR LENS PLACEMENT (IOC);  Surgeon: Tonny Branch, MD;  Location: [facility];  Service: Ophthalmology;  Laterality: Right;  CDE: 5.35  . CATARACT EXTRACTION W/PHACO Left 05/23/2016   Procedure: CATARACT EXTRACTION PHACO AND INTRAOCULAR LENS PLACEMENT LEFT EYE;  Surgeon: Tonny Branch, MD;  Location: [facility];  Service: Ophthalmology;  Laterality: Left;  CDE:  5.45  . CHOLECYSTECTOMY  1991 approx  . COLONOSCOPY N/A 04/06/2016   Procedure: COLONOSCOPY;  Surgeon: Danie Binder, MD;  Location: [facility];  Service: Endoscopy;  Laterality: N/A;  10:30 Am  . POLYPECTOMY  04/06/2016   Procedure: POLYPECTOMY;  Surgeon: Danie Binder, MD;  Location: [facility];  Service: Endoscopy;;   hepatic flexure polypectomy;  . TUBAL LIGATION  1979    Current Outpatient Medications on File Prior to Visit  Medication Sig Dispense Refill  . CALCIUM PO Take 1 tablet by mouth daily.    . cephALEXin (KEFLEX) 500 MG capsule Take 1 capsule (500 mg total) by mouth 3 (three) times daily. 30 capsule 0  . ibuprofen (ADVIL) 800 MG tablet Take one tablet up to  three times daily, as needed, for dental pain 30 tablet 0  . potassium chloride (KLOR-CON) 10 MEQ tablet Take 1 tablet (10 mEq total) by mouth daily. 90 tablet 1  . triamterene-hydrochlorothiazide (MAXZIDE-25) 37.5-25 MG tablet Take 1 tablet by mouth daily. 90 tablet 1  . VITAMIN D PO Take 1 capsule by mouth daily.     No current facility-administered medications on file prior to visit.     Social History   Socioeconomic History  . Marital status: Married    Spouse name: Not on file  . Number of children: Not on file  . Years of education: college   . Highest education level: Associate degree: academic program  Occupational History  . Not on file  Social Needs  . Financial resource strain: Not hard at all  . Food insecurity    Worry: Never true    Inability: Never true  . Transportation needs    Medical: No    Non-medical: No  Tobacco Use  . Smoking status: Never Smoker  . Smokeless tobacco: Never Used  Substance and Sexual Activity  . Alcohol use: No  .  Drug use: No  . Sexual activity: Yes    Birth control/protection: Surgical  Lifestyle  . Physical activity    Days per week: 6 days    Minutes per session: 60 min  . Stress: Not at all  Relationships  . Social connections    Talks on phone: More than three times a week    Gets together: More than three times a week    Attends religious service: More than 4 times per year    Active member of club or organization: Yes    Attends meetings of clubs or organizations: More than 4 times per year    Relationship status: Married  . Intimate partner violence    Fear of  current or ex partner: No    Emotionally abused: No    Physically abused: No    Forced sexual activity: No  Other Topics Concern  . Not on file  Social History Narrative  . Not on file    Family History  Problem Relation Age of Onset  . Heart disease Mother   . Diabetes Father   . Heart disease Brother   . Cancer Sister 72       breast   . Cancer Sister        stomach   . Heart disease Brother   . Hypertension Sister   . Hypertension Sister   . Diabetes Sister   . Diabetes Sister     BP 125/80   Pulse 89   Ht 5\' 6"  (1.676 m)   Wt 217 lb (98.4 kg)   BMI 35.02 kg/m   Body mass index is 35.02 kg/m.     Objective:   Physical Exam Vitals signs reviewed.  Constitutional:      Appearance: She is well-developed.  HENT:     Head: Normocephalic and atraumatic.  Eyes:     Conjunctiva/sclera: Conjunctivae normal.     Pupils: Pupils are equal, round, and reactive to light.  Neck:     Musculoskeletal: Normal range of motion and neck supple.  Cardiovascular:     Rate and Rhythm: Normal rate and regular rhythm.  Pulmonary:     Effort: Pulmonary effort is normal.  Abdominal:     Palpations: Abdomen is soft.  Musculoskeletal:     Right knee: She exhibits decreased range of motion and swelling. Tenderness found. Medial joint line tenderness noted.       Legs:  Skin:    General: Skin is warm and dry.  Neurological:     Mental Status: She is alert and oriented to person, place, and time.     Cranial Nerves: No cranial nerve deficit.     Motor: No abnormal muscle tone.     Coordination: Coordination normal.     Deep Tendon Reflexes: Reflexes are normal and symmetric. Reflexes normal.  Psychiatric:        Behavior: Behavior normal.        Thought Content: Thought content normal.        Judgment: Judgment normal.   X-rays were done of the right knee, reported separately.         Assessment & Plan:   Encounter Diagnosis  Name Primary?  . Acute pain of right  knee Yes   PROCEDURE NOTE:  The patient requests injections of the right knee , verbal consent was obtained.  The right knee was prepped appropriately after time out was performed.   Sterile technique was observed and injection of 1 cc of  Depo-Medrol 40 mg with several cc's of plain xylocaine. Anesthesia was provided by ethyl chloride and a 20-gauge needle was used to inject the knee area. The injection was tolerated well.  A band aid dressing was applied.  The patient was advised to apply ice later today and tomorrow to the injection sight as needed.  I have advised her to begin one Aleve twice a day.  Return April 02, 2019.  Call if any problem.  Precautions discussed.   Electronically Signed Sanjuana Kava, MD 12/3/20202:14 PM

## 2019-03-19 DIAGNOSIS — R69 Illness, unspecified: Secondary | ICD-10-CM | POA: Diagnosis not present

## 2019-03-26 DIAGNOSIS — R69 Illness, unspecified: Secondary | ICD-10-CM | POA: Diagnosis not present

## 2019-04-02 ENCOUNTER — Ambulatory Visit (INDEPENDENT_AMBULATORY_CARE_PROVIDER_SITE_OTHER): Payer: Medicare HMO | Admitting: Orthopaedic Surgery

## 2019-04-02 ENCOUNTER — Encounter: Payer: Self-pay | Admitting: Orthopaedic Surgery

## 2019-04-02 ENCOUNTER — Other Ambulatory Visit: Payer: Self-pay

## 2019-04-02 VITALS — BP 136/94 | HR 80 | Temp 97.2°F | Ht 66.0 in | Wt 217.5 lb

## 2019-04-02 DIAGNOSIS — M25561 Pain in right knee: Secondary | ICD-10-CM | POA: Diagnosis not present

## 2019-04-02 NOTE — Progress Notes (Signed)
I feel much better.  She has little pain of her right knee today.  She has no effusion, no pain.  Gait is normal.  ROM is full.  NV intact.  Encounter Diagnosis  Name Primary?  . Acute pain of right knee Yes   I will see her as needed.  Call if any problem.  Precautions discussed.   Electronically Signed Sanjuana Kava, MD 12/22/20203:00 PM

## 2019-05-02 ENCOUNTER — Ambulatory Visit: Payer: Medicare HMO | Attending: Internal Medicine

## 2019-05-02 DIAGNOSIS — Z23 Encounter for immunization: Secondary | ICD-10-CM | POA: Insufficient documentation

## 2019-05-02 NOTE — Progress Notes (Signed)
   Covid-19 Vaccination Clinic  Name:  Teresa Arellano    MRN: [id number] DOB: [birth year]  05/02/2019  Ms. Demo was observed post Covid-19 immunization for 15 minutes without incidence. She was provided with Vaccine Information Sheet and instruction to access the V-Safe system.   [name] was instructed to call 911 with any severe reactions post vaccine: Marland Kitchen Difficulty breathing  . Swelling of your face and throat  . A fast heartbeat  . A bad rash all over your body  . Dizziness and weakness    Immunizations Administered    Name Date Dose VIS Date Route   Pfizer COVID-19 Vaccine 05/02/2019  3:23 PM 0.3 mL 03/22/2019 Intramuscular   Manufacturer: [place]   Lot: [id number]   [place]: [id number]

## 2019-05-23 ENCOUNTER — Ambulatory Visit: Payer: Medicare HMO | Attending: Internal Medicine

## 2019-05-23 DIAGNOSIS — Z23 Encounter for immunization: Secondary | ICD-10-CM | POA: Insufficient documentation

## 2019-05-23 NOTE — Progress Notes (Signed)
   Covid-19 Vaccination Clinic  Name:  Teresa Arellano    MRN: [id number] DOB: 1951/03/15  05/23/2019  [name] was observed post Covid-19 immunization for 15 minutes without incidence. She was provided with Vaccine Information Sheet and instruction to access the V-Safe system.   [name] was instructed to call 911 with any severe reactions post vaccine: Marland Kitchen Difficulty breathing  . Swelling of your face and throat  . A fast heartbeat  . A bad rash all over your body  . Dizziness and weakness    Immunizations Administered    Name Date Dose VIS Date Route   Pfizer COVID-19 Vaccine 05/23/2019  5:33 PM 0.3 mL 03/22/2019 Intramuscular   Manufacturer: [place]   Lot: [id number]   [place]: [id number]

## 2019-07-01 ENCOUNTER — Encounter: Payer: Medicare HMO | Admitting: Family Medicine

## 2019-07-23 ENCOUNTER — Ambulatory Visit (INDEPENDENT_AMBULATORY_CARE_PROVIDER_SITE_OTHER): Payer: Medicare HMO | Admitting: Orthopaedic Surgery

## 2019-07-23 ENCOUNTER — Other Ambulatory Visit: Payer: Self-pay

## 2019-07-23 ENCOUNTER — Encounter: Payer: Self-pay | Admitting: Orthopaedic Surgery

## 2019-07-23 VITALS — Ht 66.0 in | Wt 224.0 lb

## 2019-07-23 DIAGNOSIS — G8929 Other chronic pain: Secondary | ICD-10-CM

## 2019-07-23 DIAGNOSIS — M25561 Pain in right knee: Secondary | ICD-10-CM | POA: Diagnosis not present

## 2019-07-23 NOTE — Progress Notes (Signed)
PROCEDURE NOTE:  The patient requests injections of the right knee , verbal consent was obtained.  The right knee was prepped appropriately after time out was performed.   Sterile technique was observed and injection of 1 cc of Depo-Medrol 40 mg with several cc's of plain xylocaine. Anesthesia was provided by ethyl chloride and a 20-gauge needle was used to inject the knee area. The injection was tolerated well.  A band aid dressing was applied.  The patient was advised to apply ice later today and tomorrow to the injection sight as needed.  I will see her as needed.   Call if any problem.  Precautions discussed.   Electronically Signed Sanjuana Kava, MD 4/13/20213:19 PM

## 2019-08-05 ENCOUNTER — Encounter: Payer: Medicare HMO | Admitting: Family Medicine

## 2019-09-03 ENCOUNTER — Ambulatory Visit: Payer: Medicare HMO

## 2019-09-03 ENCOUNTER — Other Ambulatory Visit: Payer: Self-pay

## 2019-09-03 ENCOUNTER — Encounter: Payer: Self-pay | Admitting: Orthopaedic Surgery

## 2019-09-03 ENCOUNTER — Ambulatory Visit: Payer: Medicare HMO | Admitting: Orthopaedic Surgery

## 2019-09-03 VITALS — BP 143/84 | HR 75 | Ht 66.0 in | Wt 226.0 lb

## 2019-09-03 DIAGNOSIS — M5416 Radiculopathy, lumbar region: Secondary | ICD-10-CM

## 2019-09-03 DIAGNOSIS — M25561 Pain in right knee: Secondary | ICD-10-CM

## 2019-09-03 DIAGNOSIS — G8929 Other chronic pain: Secondary | ICD-10-CM | POA: Diagnosis not present

## 2019-09-03 MED ORDER — PREDNISONE 5 MG (21) PO TBPK: ORAL_TABLET | ORAL | 0 refills | Status: DC

## 2019-09-03 MED ORDER — HYDROCODONE-ACETAMINOPHEN 5-325 MG PO TABS: ORAL_TABLET | ORAL | 0 refills | Status: DC

## 2019-09-03 NOTE — Progress Notes (Signed)
Patient Teresa Arellano, female DOB:02/19/1951, 69 y.o. [id number]  Chief Complaint  Patient presents with  . Knee Pain    Rt knee with swelling going down into ankle    HPI  Teresa Arellano is a 69 y.o. female who has had right knee pain.  Today she complains of pain from the hip to the lateral right foot and more pain in the calf laterally at times. She has no trauma.  Her knee is not that painful after the injection.  She has no giving way of the knee.  Her back is tender at times and she has problems with back pain when first standing.  She has no trauma, no weakness.   Body mass index is 36.48 kg/m.  ROS  Review of Systems  Constitutional: Positive for activity change.  Musculoskeletal: Positive for gait problem and joint swelling.  All other systems reviewed and are negative.   All other systems reviewed and are negative.  The following is a summary of the past history medically, past history surgically, known current medicines, social history and family history.  This information is gathered electronically by the computer from prior information and documentation.  I review this each visit and have found including this information at this point in the chart is beneficial and informative.    Past Medical History:  Diagnosis Date  . Cataract   . Hypertension 2014    Past Surgical History:  Procedure Laterality Date  . ABDOMINAL HYSTERECTOMY  1982   partial, fibroids  . CATARACT EXTRACTION W/PHACO Right 05/02/2016   Procedure: CATARACT EXTRACTION PHACO AND INTRAOCULAR LENS PLACEMENT (IOC);  Surgeon: Tonny Branch, MD;  Location: [facility];  Service: Ophthalmology;  Laterality: Right;  CDE: 5.35  . CATARACT EXTRACTION W/PHACO Left 05/23/2016   Procedure: CATARACT EXTRACTION PHACO AND INTRAOCULAR LENS PLACEMENT LEFT EYE;  Surgeon: Tonny Branch, MD;  Location: [facility];  Service: Ophthalmology;  Laterality: Left;  CDE:  5.45  . CHOLECYSTECTOMY  1991 approx  . COLONOSCOPY N/A  04/06/2016   Procedure: COLONOSCOPY;  Surgeon: Danie Binder, MD;  Location[facility];  Service: Endoscopy;  Laterality: N/A;  10:30 Am  . POLYPECTOMY  04/06/2016   Procedure: POLYPECTOMY;  Surgeon: Danie Binder, MD;  Location: [facility];  Service: Endoscopy;;  hepatic flexure polypectomy;  . TUBAL LIGATION  1979    Family History  Problem Relation Age of Onset  . Heart disease Mother   . Diabetes Father   . Heart disease Brother   . Cancer Sister 48       breast   . Cancer Sister        stomach   . Heart disease Brother   . Hypertension Sister   . Hypertension Sister   . Diabetes Sister   . Diabetes Sister     Social History Social History   Tobacco Use  . Smoking status: Never Smoker  . Smokeless tobacco: Never Used  Substance Use Topics  . Alcohol use: No  . Drug use: No    No Known Allergies  Current Outpatient Medications  Medication Sig Dispense Refill  . CALCIUM PO Take 1 tablet by mouth daily.    Marland Kitchen ibuprofen (ADVIL) 800 MG tablet Take one tablet up to  three times daily, as needed, for dental pain 30 tablet 0  . potassium chloride (KLOR-CON) 10 MEQ tablet Take 1 tablet (10 mEq total) by mouth daily. 90 tablet 1  . triamterene-hydrochlorothiazide (MAXZIDE-25) 37.5-25 MG tablet Take 1 tablet  by mouth daily. 90 tablet 1  . VITAMIN D PO Take 1 capsule by mouth daily.     No current facility-administered medications for this visit.     Physical Exam  Blood pressure (!) 143/84, pulse 75, height 5\' 6"  (1.676 m), weight 226 lb (102.5 kg).  Constitutional: overall normal hygiene, normal nutrition, well developed, normal grooming, normal body habitus. Assistive device:none  Musculoskeletal: gait and station Limp none, muscle tone and strength are normal, no tremors or atrophy is present.  .  Neurological: coordination overall normal.  Deep tendon reflex/nerve stretch intact.  Sensation normal.  Cranial nerves II-XII intact.   Skin:   Normal overall  no scars, lesions, ulcers or rashes. No psoriasis.  Psychiatric: Alert and oriented x 3.  Recent memory intact, remote memory unclear.  Normal mood and affect. Well groomed.  Good eye contact.  Cardiovascular: overall no swelling, no varicosities, no edema bilaterally, normal temperatures of the legs and arms, no clubbing, cyanosis and good capillary refill.  Lymphatic: palpation is normal.  Spine/Pelvis examination:  Inspection:  Overall, sacoiliac joint benign and hips nontender; without crepitus or defects.   Thoracic spine inspection: Alignment normal without kyphosis present   Lumbar spine inspection:  Alignment  with normal lumbar lordosis, without scoliosis apparent.   Thoracic spine palpation:  without tenderness of spinal processes   Lumbar spine palpation: with tenderness of lumbar area; without tightness of lumbar muscles    Range of Motion:   Lumbar flexion, forward flexion is abnormal with pain and tenderness Only to 35 degrees   Lumbar extension is full without pain or tenderness   Left lateral bend is normal without pain or tenderness   Right lateral bend is normal without pain or tenderness   Straight leg raising is normal  Strength & tone: normal   Stability overall normal stability  Right knee has no pain, has crepitus and some effusion. ROM 0 to 110.  All other systems reviewed and are negative   The patient has been educated about the nature of the problem(s) and counseled on treatment options.  The patient appeared to understand what I have discussed and is in agreement with it.  Encounter Diagnoses  Name Primary?  . Lumbar back pain with radiculopathy affecting right lower extremity Yes  . Chronic pain of right knee    X-rays were done of the lumbar spine, reported separately.  I am concerned about HNP of the lumbar spine. I would like to get a MRI.  PLAN Call if any problems.  Precautions discussed.  Continue current medications. Begin prednisone  dose pack and pain medicine.  Return to clinic 2 weeks   I have reviewed the [place] web site prior to prescribing narcotic medicine for this patient.   Electronically Signed Sanjuana Kava, MD 5/25/202110:50 AM

## 2019-09-17 ENCOUNTER — Ambulatory Visit: Payer: Medicare HMO | Admitting: Orthopaedic Surgery

## 2019-09-18 ENCOUNTER — Other Ambulatory Visit: Payer: Self-pay

## 2019-09-18 ENCOUNTER — Ambulatory Visit (HOSPITAL_COMMUNITY)
Admission: RE | Admit: 2019-09-18 | Discharge: 2019-09-18 | Disposition: A | Discharge status: Home / Self Care | Payer: Medicare HMO | Source: Ambulatory Visit | Attending: Orthopaedic Surgery | Admitting: Orthopaedic Surgery

## 2019-09-18 DIAGNOSIS — M5416 Radiculopathy, lumbar region: Secondary | ICD-10-CM | POA: Diagnosis not present

## 2019-09-18 DIAGNOSIS — M545 Low back pain: Secondary | ICD-10-CM | POA: Diagnosis not present

## 2019-09-24 ENCOUNTER — Encounter: Payer: Self-pay | Admitting: Orthopaedic Surgery

## 2019-09-24 ENCOUNTER — Other Ambulatory Visit: Payer: Self-pay

## 2019-09-24 ENCOUNTER — Ambulatory Visit (INDEPENDENT_AMBULATORY_CARE_PROVIDER_SITE_OTHER): Payer: Medicare HMO | Admitting: Orthopaedic Surgery

## 2019-09-24 VITALS — BP 137/96 | HR 83 | Ht 66.0 in | Wt 221.5 lb

## 2019-09-24 DIAGNOSIS — M25561 Pain in right knee: Secondary | ICD-10-CM

## 2019-09-24 DIAGNOSIS — M5416 Radiculopathy, lumbar region: Secondary | ICD-10-CM | POA: Diagnosis not present

## 2019-09-24 DIAGNOSIS — G8929 Other chronic pain: Secondary | ICD-10-CM | POA: Diagnosis not present

## 2019-09-24 NOTE — Progress Notes (Signed)
Patient Teresa Arellano, female DOB:Jul 22, 1950, 69 y.o. NTI:[id number]  Chief Complaint  Patient presents with  . Back Pain    Its some better/here to go over MRI    HPI  Teresa Arellano is a 69 y.o. female who has lower back pain and right knee pain.  She had a MRI of the back which showed: IMPRESSION: 1. Mild lumbar spine spondylosis as described above. 2. No acute osseous injury of the lumbar spine.  I have independently reviewed the MRI.  I have explained the findings to her.  No surgery is needed.  Her right knee is better.  She has less pain and swelling.  She has no limp today.     Body mass index is 35.75 kg/m.  ROS  Review of Systems  Constitutional: Positive for activity change.  Musculoskeletal: Positive for gait problem and joint swelling.  All other systems reviewed and are negative.   All other systems reviewed and are negative.  The following is a summary of the past history medically, past history surgically, known current medicines, social history and family history.  This information is gathered electronically by the computer from prior information and documentation.  I review this each visit and have found including this information at this point in the chart is beneficial and informative.    Past Medical History:  Diagnosis Date  . Cataract   . Hypertension 2014    Past Surgical History:  Procedure Laterality Date  . ABDOMINAL HYSTERECTOMY  1982   partial, fibroids  . CATARACT EXTRACTION W/PHACO Right 05/02/2016   Procedure: CATARACT EXTRACTION PHACO AND INTRAOCULAR LENS PLACEMENT (IOC);  Surgeon: Tonny Branch, MD;  Location: [facility];  Service: Ophthalmology;  Laterality: Right;  CDE: 5.35  . CATARACT EXTRACTION W/PHACO Left 05/23/2016   Procedure: CATARACT EXTRACTION PHACO AND INTRAOCULAR LENS PLACEMENT LEFT EYE;  Surgeon: Tonny Branch, MD;  Location: [facility];  Service: Ophthalmology;  Laterality: Left;  CDE:  5.45  . CHOLECYSTECTOMY  1991 approx  .  COLONOSCOPY N/A 04/06/2016   Procedure: COLONOSCOPY;  Surgeon: Danie Binder, MD;  Location: [facility];  Service: Endoscopy;  Laterality: N/A;  10:30 Am  . POLYPECTOMY  04/06/2016   Procedure: POLYPECTOMY;  Surgeon: Danie Binder, MD;  Location: [facility];  Service: Endoscopy;;  hepatic flexure polypectomy;  . TUBAL LIGATION  1979    Family History  Problem Relation Age of Onset  . Heart disease Mother   . Diabetes Father   . Heart disease Brother   . Cancer Sister 30       breast   . Cancer Sister        stomach   . Heart disease Brother   . Hypertension Sister   . Hypertension Sister   . Diabetes Sister   . Diabetes Sister     Social History Social History   Tobacco Use  . Smoking status: Never Smoker  . Smokeless tobacco: Never Used  Substance Use Topics  . Alcohol use: No  . Drug use: No    No Known Allergies  Current Outpatient Medications  Medication Sig Dispense Refill  . CALCIUM PO Take 1 tablet by mouth daily.    Marland Kitchen HYDROcodone-acetaminophen (NORCO/VICODIN) 5-325 MG tablet One tablet every four hours as needed for acute pain.  Limit of five days per [place]. 30 tablet 0  . ibuprofen (ADVIL) 800 MG tablet Take one tablet up to  three times daily, as needed, for dental pain 30 tablet 0  .  potassium chloride (KLOR-CON) 10 MEQ tablet Take 1 tablet (10 mEq total) by mouth daily. 90 tablet 1  . predniSONE (STERAPRED UNI-PAK 21 TAB) 5 MG (21) TBPK tablet Take 6 pills first day; 5 pills second day; 4 pills third day; 3 pills fourth day; 2 pills next day and 1 pill last day. 21 tablet 0  . triamterene-hydrochlorothiazide (MAXZIDE-25) 37.5-25 MG tablet Take 1 tablet by mouth daily. 90 tablet 1  . VITAMIN D PO Take 1 capsule by mouth daily.     No current facility-administered medications for this visit.     Physical Exam  Blood pressure (!) 137/96, pulse 83, height 5\' 6"  (1.676 m), weight 221 lb 8 oz (100.5 kg).  Constitutional: overall normal  hygiene, normal nutrition, well developed, normal grooming, normal body habitus. Assistive device:none  Musculoskeletal: gait and station Limp none, muscle tone and strength are normal, no tremors or atrophy is present.  .  Neurological: coordination overall normal.  Deep tendon reflex/nerve stretch intact.  Sensation normal.  Cranial nerves II-XII intact.   Skin:   Normal overall no scars, lesions, ulcers or rashes. No psoriasis.  Psychiatric: Alert and oriented x 3.  Recent memory intact, remote memory unclear.  Normal mood and affect. Well groomed.  Good eye contact.  Cardiovascular: overall no swelling, no varicosities, no edema bilaterally, normal temperatures of the legs and arms, no clubbing, cyanosis and good capillary refill.  Lymphatic: palpation is normal.  Spine/Pelvis examination:  Inspection:  Overall, sacoiliac joint benign and hips nontender; without crepitus or defects.   Thoracic spine inspection: Alignment normal without kyphosis present   Lumbar spine inspection:  Alignment  with normal lumbar lordosis, without scoliosis apparent.   Thoracic spine palpation:  without tenderness of spinal processes   Lumbar spine palpation: without tenderness of lumbar area; without tightness of lumbar muscles    Range of Motion:   Lumbar flexion, forward flexion is normal without pain or tenderness    Lumbar extension is full without pain or tenderness   Left lateral bend is normal without pain or tenderness   Right lateral bend is normal without pain or tenderness   Straight leg raising is normal  Strength & tone: normal   Stability overall normal stability The right knee has slight crepitus, nearly full ROM, no pain, stable.  NV intact.  All other systems reviewed and are negative   The patient has been educated about the nature of the problem(s) and counseled on treatment options.  The patient appeared to understand what I have discussed and is in agreement with  it.  Encounter Diagnoses  Name Primary?  . Lumbar back pain with radiculopathy affecting right lower extremity Yes  . Chronic pain of right knee     PLAN Call if any problems.  Precautions discussed.  Continue current medications.   Return to clinic 6 weeks   Electronically Signed Sanjuana Kava, MD 6/15/20211:55 PM

## 2019-10-23 ENCOUNTER — Other Ambulatory Visit: Payer: Self-pay

## 2019-10-23 ENCOUNTER — Ambulatory Visit (INDEPENDENT_AMBULATORY_CARE_PROVIDER_SITE_OTHER): Payer: Medicare HMO | Admitting: Family Medicine

## 2019-10-23 ENCOUNTER — Encounter: Payer: Self-pay | Admitting: Family Medicine

## 2019-10-23 VITALS — BP 129/75 | HR 77 | Resp 16 | Ht 66.0 in | Wt 222.0 lb

## 2019-10-23 DIAGNOSIS — E559 Vitamin D deficiency, unspecified: Secondary | ICD-10-CM

## 2019-10-23 DIAGNOSIS — Z780 Asymptomatic menopausal state: Secondary | ICD-10-CM

## 2019-10-23 DIAGNOSIS — Z1231 Encounter for screening mammogram for malignant neoplasm of breast: Secondary | ICD-10-CM

## 2019-10-23 DIAGNOSIS — Z0000 Encounter for general adult medical examination without abnormal findings: Secondary | ICD-10-CM

## 2019-10-23 DIAGNOSIS — I10 Essential (primary) hypertension: Secondary | ICD-10-CM | POA: Diagnosis not present

## 2019-10-23 NOTE — Progress Notes (Signed)
    Teresa Arellano     MRN: [id number]      DOB: 07-24-1950  HPI: Patient is in for annual physical exam. No other health concerns are expressed or addressed at the visit. Recent labs, if available are reviewed. Immunization is reviewed , and  updated if needed.   PE: BP 129/75   Pulse 77   Resp 16   Ht 5\' 6"  (1.676 m)   Wt 222 lb 0.6 oz (100.7 kg)   SpO2 98%   BMI 35.84 kg/m   Pleasant  female, alert and oriented x 3, in no cardio-pulmonary distress. Afebrile. HEENT No facial trauma or asymetry. Sinuses non tender.  Extra occullar muscles intact.. External ears normal, . Neck: supple, no adenopathy,JVD or thyromegaly.No bruits.  Chest: Clear to ascultation bilaterally.No crackles or wheezes. Non tender to palpation  Cardiovascular system; Heart sounds normal,  S1 and  S2 ,no S3.  No murmur, or thrill. Apical beat not displaced Peripheral pulses normal.  Abdomen: Soft, non tender, no organomegaly or masses. No bruits. Bowel sounds normal. No guarding, tenderness or rebound.    Musculoskeletal exam: Full ROM of spine, hips , shoulders and knees. No deformity ,swelling or crepitus noted. No muscle wasting or atrophy.   Neurologic: Cranial nerves 2 to 12 intact. Power, tone ,sensation and reflexes normal throughout. No disturbance in gait. No tremor.  Skin: Intact, no ulceration, erythema , scaling or rash noted. Pigmentation normal throughout  Psych; Normal mood and affect. Judgement and concentration normal   Assessment & Plan:  Annual physical exam Annual exam as documented. Counseling done  re healthy lifestyle involving commitment to 150 minutes exercise per week, heart healthy diet, and attaining healthy weight.The importance of adequate sleep also discussed. Regular seat belt use and home safety, is also discussed. Changes in health habits are decided on by the patient with goals and time frames  set for achieving them. Immunization and cancer  screening needs are specifically addressed at this visit.

## 2019-10-23 NOTE — Patient Instructions (Addendum)
Keep wellness as scheduled  F/U with MD in 6 months.  Fasting CBC, lipid, cmp and EGFR, tSH and vit D as soon as possible   Mammogram to be scheduled Nov 3 or after  Dexa to be scheduled as soon as available opening  It is important that you exercise regularly at least 30 minutes 5 times a week. If you develop chest pain, have severe difficulty breathing, or feel very tired, stop exercising immediately and seek medical attention    Think about what you will eat, plan ahead. Choose " clean, [place], fresh or frozen" over canned, processed or packaged foods which are more sugary, salty and fatty. 70 to 75% of food eaten should be vegetables and fruit. Three meals at set times with snacks allowed between meals, but they must be fruit or vegetables. Aim to eat over a 12 hour period , example 7 am to 7 pm, and STOP after  your last meal of the day. Drink water,generally about 64 ounces per day, no other drink is as healthy. Fruit juice is best enjoyed in a healthy way, by EATING the fruit.    Exercises To Do While Sitting  Exercises that you do while sitting (chair exercises) can give you many of the same benefits as full exercise. Benefits include strengthening your heart, burning calories, and keeping muscles and joints healthy. Exercise can also improve your mood and help with depression and anxiety. You may benefit from chair exercises if you are unable to do standing exercises because of:  Diabetic foot pain.  Obesity.  Illness.  Arthritis.  Recovery from surgery or injury.  Breathing problems.  Balance problems.  Another type of disability. Before starting chair exercises, check with your health care provider or a physical therapist to find out how much exercise you can tolerate and which exercises are safe for you. If your health care provider approves:  Start out slowly and build up over time. Aim to work up to about 10-20 minutes for each exercise session.  Make  exercise part of your daily routine.  Drink water when you exercise. Do not wait until you are thirsty. Drink every 10-15 minutes.  Stop exercising right away if you have pain, nausea, shortness of breath, or dizziness.  If you are exercising in a wheelchair, make sure to lock the wheels.  Ask your health care provider whether you can do tai chi or yoga. Many positions in these mind-body exercises can be modified to do while seated. Warm-up Before starting other exercises: 1. Sit up as straight as you can. Have your knees bent at 90 degrees, which is the shape of the capital letter "L." Keep your feet flat on the floor. 2. Sit at the front edge of your chair, if you can. 3. Pull in (tighten) the muscles in your abdomen and stretch your spine and neck as straight as you can. Hold this position for a few minutes. 4. Breathe in and out evenly. Try to concentrate on your breathing, and relax your mind. Stretching Exercise A: Arm stretch 1. Hold your arms out straight in front of your body. 2. Bend your hands at the wrist with your fingers pointing up, as if signaling someone to stop. Notice the slight tension in your forearms as you hold the position. 3. Keeping your arms out and your hands bent, rotate your hands outward as far as you can and hold this stretch. Aim to have your thumbs pointing up and your pinkie fingers pointing down. Slowly  repeat arm stretches for one minute as tolerated. Exercise B: Leg stretch 1. If you can move your legs, try to "draw" letters on the floor with the toes of your foot. Write your name with one foot. 2. Write your name with the toes of your other foot. Slowly repeat the movements for one minute as tolerated. Exercise C: Reach for the sky 1. Reach your hands as far over your head as you can to stretch your spine. 2. Move your hands and arms as if you are climbing a rope. Slowly repeat the movements for one minute as tolerated. Range of motion  exercises Exercise A: Shoulder roll 1. Let your arms hang loosely at your sides. 2. Lift just your shoulders up toward your ears, then let them relax back down. 3. When your shoulders feel loose, rotate your shoulders in backward and forward circles. Do shoulder rolls slowly for one minute as tolerated. Exercise B: March in place 1. As if you are marching, pump your arms and lift your legs up and down. Lift your knees as high as you can. ? If you are unable to lift your knees, just pump your arms and move your ankles and feet up and down. March in place for one minute as tolerated. Exercise C: Seated jumping jacks 1. Let your arms hang down straight. 2. Keeping your arms straight, lift them up over your head. Aim to point your fingers to the ceiling. 3. While you lift your arms, straighten your legs and slide your heels along the floor to your sides, as wide as you can. 4. As you bring your arms back down to your sides, slide your legs back together. ? If you are unable to use your legs, just move your arms. Slowly repeat seated jumping jacks for one minute as tolerated. Strengthening exercises Exercise A: Shoulder squeeze 1. Hold your arms straight out from your body to your sides, with your elbows bent and your fists pointed at the ceiling. 2. Keeping your arms in the bent position, move them forward so your elbows and forearms meet in front of your face. 3. Open your arms back out as wide as you can with your elbows still bent, until you feel your shoulder blades squeezing together. Hold for 5 seconds. Slowly repeat the movements forward and backward for one minute as tolerated. Contact a health care provider if you:  Had to stop exercising due to any of the following: ? Pain. ? Nausea. ? Shortness of breath. ? Dizziness. ? Fatigue.  Have significant pain or soreness after exercising. Get help right away if you have:  Chest pain.  Difficulty breathing. These symptoms may  represent a serious problem that is an emergency. Do not wait to see if the symptoms will go away. Get medical help right away. Call your local emergency services (911 in the [place] Do not drive yourself to the hospital. This information is not intended to replace advice given to you by your health care provider. Make sure you discuss any questions you have with your health care provider. Document Revised: 07/19/2018 Document Reviewed: 02/08/2017 Elsevier Patient Education  2020 [employer].

## 2019-10-24 ENCOUNTER — Encounter: Payer: Self-pay | Admitting: Family Medicine

## 2019-10-24 NOTE — Assessment & Plan Note (Signed)

## 2019-10-25 DIAGNOSIS — I10 Essential (primary) hypertension: Secondary | ICD-10-CM | POA: Diagnosis not present

## 2019-10-25 DIAGNOSIS — E559 Vitamin D deficiency, unspecified: Secondary | ICD-10-CM | POA: Diagnosis not present

## 2019-10-26 LAB — COMPLETE METABOLIC PANEL WITH GFR
AG Ratio: 1.2 (calc) (ref 1.0–2.5)
ALT: 14 U/L (ref 6–29)
AST: 19 U/L (ref 10–35)
Albumin: 3.7 g/dL (ref 3.6–5.1)
Alkaline phosphatase (APISO): 50 U/L (ref 37–153)
BUN: 17 mg/dL (ref 7–25)
CO2: 25 mmol/L (ref 20–32)
Calcium: 9 mg/dL (ref 8.6–10.4)
Chloride: 108 mmol/L (ref 98–110)
Creat: 0.68 mg/dL (ref 0.50–0.99)
GFR, Est African American: 104 mL/min/{1.73_m2} (ref >=60)
GFR, Est Non African American: 90 mL/min/{1.73_m2} (ref >=60)
Globulin: 3.2 g/dL (calc) (ref 1.9–3.7)
Glucose, Bld: 103 mg/dL — ABNORMAL HIGH (ref 65–99)
Potassium: 3.4 mmol/L — ABNORMAL LOW (ref 3.5–5.3)
Sodium: 143 mmol/L (ref 135–146)
Total Bilirubin: 0.4 mg/dL (ref 0.2–1.2)
Total Protein: 6.9 g/dL (ref 6.1–8.1)

## 2019-10-26 LAB — CBC
HCT: 38 % (ref 35.0–45.0)
Hemoglobin: 12 g/dL (ref 11.7–15.5)
MCH: 26.1 pg — ABNORMAL LOW (ref 27.0–33.0)
MCHC: 31.6 g/dL — ABNORMAL LOW (ref 32.0–36.0)
MCV: 82.8 fL (ref 80.0–100.0)
MPV: 9.7 fL (ref 7.5–12.5)
Platelets: 293 10*3/uL (ref 140–400)
RBC: 4.59 10*6/uL (ref 3.80–5.10)
RDW: 13.3 % (ref 11.0–15.0)
WBC: 7.1 10*3/uL (ref 3.8–10.8)

## 2019-10-26 LAB — LIPID PANEL
Cholesterol: 161 mg/dL (ref <200)
HDL: 49 mg/dL — ABNORMAL LOW (ref >=50)
LDL Cholesterol (Calc): 93 mg/dL (calc)
Non-HDL Cholesterol (Calc): 112 mg/dL (calc) (ref <130)
Total CHOL/HDL Ratio: 3.3 (calc) (ref <5.0)
Triglycerides: 93 mg/dL (ref <150)

## 2019-10-26 LAB — VITAMIN D 25 HYDROXY (VIT D DEFICIENCY, FRACTURES): Vit D, 25-Hydroxy: 22 ng/mL — ABNORMAL LOW (ref 30–100)

## 2019-10-26 LAB — TSH: TSH: 0.99 mIU/L (ref 0.40–4.50)

## 2019-11-05 ENCOUNTER — Other Ambulatory Visit: Payer: Self-pay

## 2019-11-05 ENCOUNTER — Ambulatory Visit (HOSPITAL_COMMUNITY)
Admission: RE | Admit: 2019-11-05 | Discharge: 2019-11-05 | Disposition: A | Discharge status: Home / Self Care | Payer: Medicare HMO | Source: Ambulatory Visit | Attending: Family Medicine | Admitting: Family Medicine

## 2019-11-05 ENCOUNTER — Ambulatory Visit: Payer: Medicare HMO | Admitting: Orthopaedic Surgery

## 2019-11-05 DIAGNOSIS — Z780 Asymptomatic menopausal state: Secondary | ICD-10-CM

## 2019-11-05 DIAGNOSIS — M8589 Other specified disorders of bone density and structure, multiple sites: Secondary | ICD-10-CM | POA: Diagnosis not present

## 2019-11-05 DIAGNOSIS — R29890 Loss of height: Secondary | ICD-10-CM | POA: Diagnosis not present

## 2019-11-08 DIAGNOSIS — I10 Essential (primary) hypertension: Secondary | ICD-10-CM | POA: Diagnosis not present

## 2019-11-08 DIAGNOSIS — Z8249 Family history of ischemic heart disease and other diseases of the circulatory system: Secondary | ICD-10-CM | POA: Diagnosis not present

## 2019-11-08 DIAGNOSIS — Z008 Encounter for other general examination: Secondary | ICD-10-CM | POA: Diagnosis not present

## 2019-11-08 DIAGNOSIS — Z833 Family history of diabetes mellitus: Secondary | ICD-10-CM | POA: Diagnosis not present

## 2019-11-08 DIAGNOSIS — M1990 Unspecified osteoarthritis, unspecified site: Secondary | ICD-10-CM | POA: Diagnosis not present

## 2019-11-08 DIAGNOSIS — Z6835 Body mass index (BMI) 35.0-35.9, adult: Secondary | ICD-10-CM | POA: Diagnosis not present

## 2019-11-08 DIAGNOSIS — Z803 Family history of malignant neoplasm of breast: Secondary | ICD-10-CM | POA: Diagnosis not present

## 2019-11-11 ENCOUNTER — Other Ambulatory Visit: Payer: Self-pay | Admitting: Family Medicine

## 2019-11-11 MED ORDER — ALENDRONATE SODIUM 70 MG PO TABS: 70.0000 mg | ORAL_TABLET | ORAL | 11 refills | Status: DC

## 2019-11-14 ENCOUNTER — Other Ambulatory Visit: Payer: Self-pay | Admitting: Family Medicine

## 2019-11-25 ENCOUNTER — Other Ambulatory Visit: Payer: Self-pay

## 2019-11-25 ENCOUNTER — Encounter: Payer: Self-pay | Admitting: Family Medicine

## 2019-11-25 ENCOUNTER — Ambulatory Visit (INDEPENDENT_AMBULATORY_CARE_PROVIDER_SITE_OTHER): Payer: Medicare HMO | Admitting: Family Medicine

## 2019-11-25 VITALS — BP 124/82 | HR 78 | Ht 66.0 in | Wt 221.0 lb

## 2019-11-25 DIAGNOSIS — M7551 Bursitis of right shoulder: Secondary | ICD-10-CM | POA: Diagnosis not present

## 2019-11-25 DIAGNOSIS — I10 Essential (primary) hypertension: Secondary | ICD-10-CM | POA: Diagnosis not present

## 2019-11-25 MED ORDER — PREDNISONE 20 MG PO TABS
ORAL_TABLET | ORAL | 0 refills | Status: DC
Start: 2019-11-25 — End: 2020-04-27

## 2019-11-25 MED ORDER — KETOROLAC TROMETHAMINE 60 MG/2ML IM SOLN
60.0000 mg | Freq: Once | INTRAMUSCULAR | Status: AC
  Administered 2019-11-25: 60 mg via INTRAMUSCULAR

## 2019-11-25 MED ORDER — IBUPROFEN 800 MG PO TABS: ORAL_TABLET | ORAL | 0 refills | Status: DC

## 2019-11-25 MED ORDER — METHYLPREDNISOLONE ACETATE 80 MG/ML IJ SUSP
80.0000 mg | Freq: Once | INTRAMUSCULAR | Status: AC
  Administered 2019-11-25: 80 mg via INTRAMUSCULAR

## 2019-11-25 MED ORDER — FAMOTIDINE 40 MG PO TABS
40.0000 mg | ORAL_TABLET | Freq: Every day | ORAL | 0 refills | Status: DC
Start: 2019-11-25 — End: 2020-12-25

## 2019-11-25 NOTE — Assessment & Plan Note (Signed)
3 day history Uncontrolled.Toradol and depo medrol administered IM in the office , to be followed by a short course of oral prednisone and NSAIDS.

## 2019-11-25 NOTE — Patient Instructions (Addendum)
F/U as before, call in 2 days if not much better, I will refer you to Orthopedics  You are treated for acute bursitis right shoulder  Toradol 60 mg IM and DepoMedrol 80 mg IM in the office today  Ibuprofen, prednisone and Pepcid are precribed  Work excuse from today retuen 08/04/19/2019     Bursitis  Bursitis is when the fluid-filled sac (bursa) that covers and protects a joint is swollen (inflamed). Bursitis is most common near joints such as the knees, elbows, hips, and shoulders. It can cause pain and stiffness. Follow these instructions at home: Medicines  Take over-the-counter and prescription medicines only as told by your doctor.  If you were prescribed an antibiotic medicine, take it as told by your doctor. Do not stop taking it even if you start to feel better. General instructions   Rest the affected area as told by your doctor. ? If you can, raise (elevate) the affected area above the level of your heart while you are sitting or lying down. ? Avoid doing things that make the pain worse.  Use a splint, brace, pad, or walking aid as told by your doctor.  If directed, put ice on the affected area: ? If you have a removable splint or brace, take it off as told by your doctor. ? Put ice in a plastic bag. ? Place a towel between your skin and the bag, or between the splint or brace and the bag. ? Leave the ice on for 20 minutes, 2-3 times a day.  Keep all follow-up visits as told by your doctor. This is important. Preventing symptoms Do these things to help you not have symptoms again:  Wear knee pads if you kneel often.  Wear sturdy running or walking shoes that fit you well.  Take a lot of breaks during activities that involve doing the same movements again and again.  Before you do any activity that takes a lot of effort, get your body ready by stretching.  Stay at a healthy weight or lose weight if your doctor says you should. If you need help doing this, ask  your doctor.  Exercise often. If you start any new physical activity, do it slowly. Contact a doctor if you:  Have a fever.  Have chills.  Have symptoms that do not get better with treatment or home care. Summary  Bursitis is when the fluid-filled sac (bursa) that covers and protects a joint is swollen.  Rest the affected area as told by your doctor.  Avoid doing things that make the pain worse.  Put ice on the affected area as told by your doctor. This information is not intended to replace advice given to you by your health care provider. Make sure you discuss any questions you have with your health care provider. Document Revised: 03/10/2017 Document Reviewed: 02/10/2017 Elsevier Patient Education  2020 [employer].

## 2019-11-27 ENCOUNTER — Encounter: Payer: Medicare HMO | Admitting: Family Medicine

## 2019-11-28 ENCOUNTER — Encounter: Payer: Self-pay | Admitting: Family Medicine

## 2019-11-28 NOTE — Progress Notes (Signed)
   Alila Sotero     MRN: [id number]      DOB: 1950-12-27   HPI Ms. Stoneham is here with a 3 day h/o right shoulder and upper arm pain, no trauma, limited mobility, first episode. Denies neck pain ROS Denies recent fever or chills. Denies sinus pressure, nasal congestion, ear pain or sore throat. Denies chest congestion, productive cough or wheezing. Denies chest pains, palpitations and leg swelling Denies , numbness, or tingling.  PE  BP 124/82   Pulse 78   Ht 5\' 6"  (1.676 m)   Wt 221 lb (100.2 kg)   SpO2 97%   BMI 35.67 kg/m   Patient alert and oriented and in no cardiopulmonary distress.Pt in pain  HEENT: No facial asymmetry, EOMI,     Neck supple .  Chest: Clear to auscultation bilaterally.  CVS: S1, S2 no murmurs, no S3.Regular rate.  ABD: Soft non tender.   Ext: No edema  MS: Adequate ROM spine,  hips and knees.Markedly reduced ROM right shoulder with tenderness and swelling of posterior bursa  Skin: Intact, no ulcerations or rash noted.  CNS: CN 2-12 intact, power,  normal throughout.no focal deficits noted.   Assessment & Plan  Bursitis of right shoulder 3 day history Uncontrolled.Toradol and depo medrol administered IM in the office , to be followed by a short course of oral prednisone and NSAIDS.   Essential hypertension, benign Controlled, no change in medication

## 2019-11-28 NOTE — Assessment & Plan Note (Signed)
Controlled, no change in medication  

## 2019-12-30 ENCOUNTER — Other Ambulatory Visit: Payer: Self-pay | Admitting: Family Medicine

## 2019-12-31 ENCOUNTER — Telehealth: Payer: Medicare HMO

## 2020-01-01 ENCOUNTER — Telehealth: Payer: Self-pay | Admitting: Family Medicine

## 2020-01-01 ENCOUNTER — Other Ambulatory Visit: Payer: Self-pay

## 2020-01-01 MED ORDER — POTASSIUM CHLORIDE CRYS ER 10 MEQ PO TBCR: 10.0000 meq | EXTENDED_RELEASE_TABLET | Freq: Every day | ORAL | 1 refills | Status: DC

## 2020-01-01 NOTE — Telephone Encounter (Signed)
Med sent.

## 2020-01-01 NOTE — Telephone Encounter (Signed)
Patient is needing a refill on potassium sent to [facility] in [place] phone is [phone]

## 2020-01-13 ENCOUNTER — Telehealth: Payer: Medicare HMO

## 2020-01-16 ENCOUNTER — Telehealth: Payer: Medicare HMO | Admitting: Family Medicine

## 2020-02-03 ENCOUNTER — Telehealth (INDEPENDENT_AMBULATORY_CARE_PROVIDER_SITE_OTHER): Payer: Medicare HMO

## 2020-02-03 ENCOUNTER — Other Ambulatory Visit: Payer: Self-pay

## 2020-02-03 VITALS — BP 124/82 | HR 78 | Ht 66.0 in | Wt 221.0 lb

## 2020-02-03 DIAGNOSIS — Z0000 Encounter for general adult medical examination without abnormal findings: Secondary | ICD-10-CM

## 2020-02-03 NOTE — Patient Instructions (Addendum)
Teresa Arellano , Thank you for taking time to come for your Medicare Wellness Visit. I appreciate your ongoing commitment to your health goals. Please review the following plan we discussed and let me know if I can assist you in the future.   Screening recommendations/referrals: Colonoscopy: 04/06/2026  Mammogram: Scheduled 02/17/2020  Bone Density: No longer required    Recommended yearly ophthalmology/optometry visit for glaucoma screening and checkup Recommended yearly dental visit for hygiene and checkup  Vaccinations: Influenza vaccine: Due, pt advised.  Pneumococcal vaccine: Complete Tdap vaccine: 09/11/2023  Shingles vaccine: Due, information provided.     Advanced directives: None   Conditions/risks identified: None   Next appointment: 04/27/2020 @ 1:40pm with Dr. Moshe Cipro    Preventive Care 65 Years and Older, Female Preventive care refers to lifestyle choices and visits with your health care provider that can promote health and wellness. What does preventive care include?  A yearly physical exam. This is also called an annual well check.  Dental exams once or twice a year.  Routine eye exams. Ask your health care provider how often you should have your eyes checked.  Personal lifestyle choices, including:  Daily care of your teeth and gums.  Regular physical activity.  Eating a healthy diet.  Avoiding tobacco and drug use.  Limiting alcohol use.  Practicing safe sex.  Taking low-dose aspirin every day.  Taking vitamin and mineral supplements as recommended by your health care provider. What happens during an annual well check? The services and screenings done by your health care provider during your annual well check will depend on your age, overall health, lifestyle risk factors, and family history of disease. Counseling  Your health care provider may ask you questions about your:  Alcohol use.  Tobacco use.  Drug use.  Emotional well-being.  Home and  relationship well-being.  Sexual activity.  Eating habits.  History of falls.  Memory and ability to understand (cognition).  Work and work Statistician.  Reproductive health. Screening  You may have the following tests or measurements:  Height, weight, and BMI.  Blood pressure.  Lipid and cholesterol levels. These may be checked every 5 years, or more frequently if you are over 1 years old.  Skin check.  Lung cancer screening. You may have this screening every year starting at age 34 if you have a 30-pack-year history of smoking and currently smoke or have quit within the past 15 years.  Fecal occult blood test (FOBT) of the stool. You may have this test every year starting at age 45.  Flexible sigmoidoscopy or colonoscopy. You may have a sigmoidoscopy every 5 years or a colonoscopy every 10 years starting at age 52.  Hepatitis C blood test.  Hepatitis B blood test.  Sexually transmitted disease (STD) testing.  Diabetes screening. This is done by checking your blood sugar (glucose) after you have not eaten for a while (fasting). You may have this done every 1-3 years.  Bone density scan. This is done to screen for osteoporosis. You may have this done starting at age 11.  Mammogram. This may be done every 1-2 years. Talk to your health care provider about how often you should have regular mammograms. Talk with your health care provider about your test results, treatment options, and if necessary, the need for more tests. Vaccines  Your health care provider may recommend certain vaccines, such as:  Influenza vaccine. This is recommended every year.  Tetanus, diphtheria, and acellular pertussis (Tdap, Td) vaccine. You may need  a Td booster every 10 years.  Zoster vaccine. You may need this after age 28.  Pneumococcal 13-valent conjugate (PCV13) vaccine. One dose is recommended after age 56.  Pneumococcal polysaccharide (PPSV23) vaccine. One dose is recommended after  age 73. Talk to your health care provider about which screenings and vaccines you need and how often you need them. This information is not intended to replace advice given to you by your health care provider. Make sure you discuss any questions you have with your health care provider. Document Released: 04/24/2015 Document Revised: 12/16/2015 Document Reviewed: 01/27/2015 Elsevier Interactive Patient Education  2017 [employer] Prevention in the Home Falls can cause injuries. They can happen to people of all ages. There are many things you can do to make your home safe and to help prevent falls. What can I do on the outside of my home?  Regularly fix the edges of walkways and driveways and fix any cracks.  Remove anything that might make you trip as you walk through a door, such as a raised step or threshold.  Trim any bushes or trees on the path to your home.  Use bright outdoor lighting.  Clear any walking paths of anything that might make someone trip, such as rocks or tools.  Regularly check to see if handrails are loose or broken. Make sure that both sides of any steps have handrails.  Any raised decks and porches should have guardrails on the edges.  Have any leaves, snow, or ice cleared regularly.  Use sand or salt on walking paths during winter.  Clean up any spills in your garage right away. This includes oil or grease spills. What can I do in the bathroom?  Use night lights.  Install grab bars by the toilet and in the tub and shower. Do not use towel bars as grab bars.  Use non-skid mats or decals in the tub or shower.  If you need to sit down in the shower, use a plastic, non-slip stool.  Keep the floor dry. Clean up any water that spills on the floor as soon as it happens.  Remove soap buildup in the tub or shower regularly.  Attach bath mats securely with double-sided non-slip rug tape.  Do not have throw rugs and other things on the floor that can  make you trip. What can I do in the bedroom?  Use night lights.  Make sure that you have a light by your bed that is easy to reach.  Do not use any sheets or blankets that are too big for your bed. They should not hang down onto the floor.  Have a firm chair that has side arms. You can use this for support while you get dressed.  Do not have throw rugs and other things on the floor that can make you trip. What can I do in the kitchen?  Clean up any spills right away.  Avoid walking on wet floors.  Keep items that you use a lot in easy-to-reach places.  If you need to reach something above you, use a strong step stool that has a grab bar.  Keep electrical cords out of the way.  Do not use floor polish or wax that makes floors slippery. If you must use wax, use non-skid floor wax.  Do not have throw rugs and other things on the floor that can make you trip. What can I do with my stairs?  Do not leave any items on the  stairs.  Make sure that there are handrails on both sides of the stairs and use them. Fix handrails that are broken or loose. Make sure that handrails are as long as the stairways.  Check any carpeting to make sure that it is firmly attached to the stairs. Fix any carpet that is loose or worn.  Avoid having throw rugs at the top or bottom of the stairs. If you do have throw rugs, attach them to the floor with carpet tape.  Make sure that you have a light switch at the top of the stairs and the bottom of the stairs. If you do not have them, ask someone to add them for you. What else can I do to help prevent falls?  Wear shoes that:  Do not have high heels.  Have rubber bottoms.  Are comfortable and fit you well.  Are closed at the toe. Do not wear sandals.  If you use a stepladder:  Make sure that it is fully opened. Do not climb a closed stepladder.  Make sure that both sides of the stepladder are locked into place.  Ask someone to hold it for you,  if possible.  Clearly mark and make sure that you can see:  Any grab bars or handrails.  First and last steps.  Where the edge of each step is.  Use tools that help you move around (mobility aids) if they are needed. These include:  Canes.  Walkers.  Scooters.  Crutches.  Turn on the lights when you go into a dark area. Replace any light bulbs as soon as they burn out.  Set up your furniture so you have a clear path. Avoid moving your furniture around.  If any of your floors are uneven, fix them.  If there are any pets around you, be aware of where they are.  Review your medicines with your doctor. Some medicines can make you feel dizzy. This can increase your chance of falling. Ask your doctor what other things that you can do to help prevent falls. This information is not intended to replace advice given to you by your health care provider. Make sure you discuss any questions you have with your health care provider. Document Released: 01/22/2009 Document Revised: 09/03/2015 Document Reviewed: 05/02/2014 Elsevier Interactive Patient Education  2017 [employer].

## 2020-02-03 NOTE — Progress Notes (Addendum)
Subjective:   Teresa Arellano is a 69 y.o. female who presents for Medicare Annual (Subsequent) preventive examination.        Objective:    There were no vitals filed for this visit. There is no height or weight on file to calculate BMI.  Advanced Directives 12/25/2017 08/24/2016 05/23/2016 05/02/2016 04/27/2016 04/06/2016  Does Patient Have a Medical Advance Directive? No No No No No No  Would patient like information on creating a medical advance directive? Yes (ED - Information included in AVS) - No - Patient declined No - Patient declined No - Patient declined No - Patient declined    Current Medications (verified) Outpatient Encounter Medications as of 02/03/2020  Medication Sig  . alendronate (FOSAMAX) 70 MG tablet Take 1 tablet (70 mg total) by mouth every 7 (seven) days. Take with a full glass of water on an empty stomach.  Marland Kitchen CALCIUM PO Take 1 tablet by mouth daily.  . famotidine (PEPCID) 40 MG tablet Take 1 tablet (40 mg total) by mouth daily.  Marland Kitchen ibuprofen (ADVIL) 800 MG tablet Take one tablet by mouth two times daily for 1 week, then stop  . potassium chloride (KLOR-CON) 10 MEQ tablet Take 1 tablet (10 mEq total) by mouth daily.  . predniSONE (DELTASONE) 20 MG tablet Take one tablet by mouth two times daily for 5 days, then take one tablet once daily for an additional 5 days then stop  . triamterene-hydrochlorothiazide (MAXZIDE-25) 37.5-25 MG tablet Take 1 tablet by mouth once daily  . VITAMIN D PO Take 1 capsule by mouth daily.   No facility-administered encounter medications on file as of 02/03/2020.    Allergies (verified) Patient has no known allergies.   History: Past Medical History:  Diagnosis Date  . Cataract   . Hypertension 2014   Past Surgical History:  Procedure Laterality Date  . ABDOMINAL HYSTERECTOMY  1982   partial, fibroids  . CATARACT EXTRACTION W/PHACO Right 05/02/2016   Procedure: CATARACT EXTRACTION PHACO AND INTRAOCULAR LENS PLACEMENT  (IOC);  Surgeon: Tonny Branch, MD;  Location: [facility];  Service: Ophthalmology;  Laterality: Right;  CDE: 5.35  . CATARACT EXTRACTION W/PHACO Left 05/23/2016   Procedure: CATARACT EXTRACTION PHACO AND INTRAOCULAR LENS PLACEMENT LEFT EYE;  Surgeon: Tonny Branch, MD;  Location: [facility];  Service: Ophthalmology;  Laterality: Left;  CDE:  5.45  . CHOLECYSTECTOMY  1991 approx  . COLONOSCOPY N/A 04/06/2016   Procedure: COLONOSCOPY;  Surgeon: Danie Binder, MD;  Location: [facility];  Service: Endoscopy;  Laterality: N/A;  10:30 Am  . POLYPECTOMY  04/06/2016   Procedure: POLYPECTOMY;  Surgeon: Danie Binder, MD;  Location: [facility];  Service: Endoscopy;;  hepatic flexure polypectomy;  . TUBAL LIGATION  1979   Family History  Problem Relation Age of Onset  . Heart disease Mother   . Diabetes Father   . Heart disease Brother   . Cancer Sister 53       breast   . Cancer Sister        stomach   . Heart disease Brother   . Hypertension Sister   . Hypertension Sister   . Diabetes Sister   . Diabetes Sister    Social History   Socioeconomic History  . Marital status: Married    Spouse name: Not on file  . Number of children: Not on file  . Years of education: college   . Highest education level: Associate degree: academic program  Occupational History  .  Not on file  Tobacco Use  . Smoking status: Never Smoker  . Smokeless tobacco: Never Used  Substance and Sexual Activity  . Alcohol use: No  . Drug use: No  . Sexual activity: Yes    Birth control/protection: Surgical  Other Topics Concern  . Not on file  Social History Narrative  . Not on file   Social Determinants of Health   Financial Resource Strain:   . Difficulty of Paying Living Expenses: Not on file  Food Insecurity:   . Worried About Charity fundraiser in the Last Year: Not on file  . Ran Out of Food in the Last Year: Not on file  Transportation Needs:   . Lack of Transportation (Medical): Not on file  . Lack  of Transportation (Non-Medical): Not on file  Physical Activity:   . Days of Exercise per Week: Not on file  . Minutes of Exercise per Session: Not on file  Stress:   . Feeling of Stress : Not on file  Social Connections:   . Frequency of Communication with Friends and Family: Not on file  . Frequency of Social Gatherings with Friends and Family: Not on file  . Attends Religious Services: Not on file  . Active Member of Clubs or Organizations: Not on file  . Attends Archivist Meetings: Not on file  . Marital Status: Not on file    Tobacco Counseling Counseling given: Not Answered                  Diabetic? No          Activities of Daily Living No flowsheet data found.  Patient Care Team: Fayrene Helper, MD as PCP - General (Family Medicine)  Indicate any recent Medical Services you may have received from other than [facility] providers in the past year (date may be approximate).     Assessment:   This is a routine wellness examination for Teresa Arellano.  Hearing/Vision screen No exam data present  Dietary issues and exercise activities discussed:    Goals   None    Depression Screen PHQ 2/9 Scores 11/25/2019 10/23/2019 03/11/2019 12/27/2018 08/01/2018 12/25/2017 07/27/2017  PHQ - 2 Score 0 0 0 0 0 0 0  PHQ- 9 Score - - - - - - 2    Fall Risk Fall Risk  11/25/2019 10/23/2019 03/11/2019 12/27/2018 08/01/2018  Falls in the past year? 0 0 0 0 0  Number falls in past yr: 0 - 0 0 0  Injury with Fall? 0 - 0 0 0  Follow up Falls evaluation completed - - Falls evaluation completed;Education provided -    Any stairs in or around the home? Yes  If so, are there any without handrails? Yes  Home free of loose throw rugs in walkways, pet beds, electrical cords, etc? Yes  Adequate lighting in your home to reduce risk of falls? Yes   ASSISTIVE DEVICES UTILIZED TO PREVENT FALLS:  Life alert? No  Use of a cane, walker or w/c? No  Grab bars in the bathroom? No   Shower chair or bench in shower? No  Elevated toilet seat or a handicapped toilet? No   TIMED UP AND GO:  Was the test performed? No .    Cognitive Function:     6CIT Screen 12/27/2018 12/25/2017  What Year? 0 points 0 points  What month? 0 points 0 points  What time? 0 points 0 points  Count back from 20 0 points -  Months in reverse 0 points -  Repeat phrase 0 points -  Total Score 0 -    Immunizations Immunization History  Administered Date(s) Administered  . Fluad Quad(high Dose 65+) 12/24/2018  . Influenza,inj,Quad PF,6+ Mos 01/09/2014, 11/26/2015, 05/18/2017, 12/29/2017  . PFIZER SARS-COV-2 Vaccination 05/02/2019, 05/23/2019  . Pneumococcal Conjugate-13 05/17/2016  . Pneumococcal Polysaccharide-23 05/18/2017  . Tdap 09/10/2013  . Zoster 10/20/2014    TDAP status: Up to date Flu Vaccine: Due; information provided  Pneumococcal vaccine status: Up to date Covid-19 vaccine status: Completed vaccines  Qualifies for Shingles Vaccine? Yes   Zostavax completed No   Shingrix Completed?: No.    Education has been provided regarding the importance of this vaccine. Patient has been advised to call insurance company to determine out of pocket expense if they have not yet received this vaccine. Advised may also receive vaccine at local pharmacy or Health Dept. Verbalized acceptance and understanding.  Screening Tests Health Maintenance  Topic Date Due  . INFLUENZA VACCINE  11/10/2019  . MAMMOGRAM  02/10/2021  . TETANUS/TDAP  09/11/2023  . COLONOSCOPY  04/06/2026  . DEXA SCAN  Completed  . COVID-19 Vaccine  Completed  . Hepatitis C Screening  Completed  . PNA vac Low Risk Adult  Completed    Health Maintenance  Health Maintenance Due  Topic Date Due  . INFLUENZA VACCINE  11/10/2019    Colorectal cancer screening: Completed normal. Repeat every 10 years Mammogram status: Ordered 02/17/2020. Pt provided with contact info and advised to call to schedule appt.  Bone  Density: Completed; no longer required.   Lung Cancer Screening: (Low Dose CT Chest recommended if Age 15-80 years, 30 pack-year currently smoking OR have quit w/in 15years.) does not qualify.    Additional Screening:  Hepatitis C Screening: does qualify; Completed   Vision Screening: Recommended annual ophthalmology exams for early detection of glaucoma and other disorders of the eye.  Is the patient up to date with their annual eye exam?  Yes    Who is the provider or what is the name of the office in which the patient attends annual eye exams? [facility]   If pt is not established with a provider, would they like to be referred to a provider to establish care? No .   Dental Screening: Recommended annual dental exams for proper oral hygiene  Community Resource Referral / Chronic Care Management: CRR required this visit?  No   CCM required this visit?  No      Plan:     I have personally reviewed and noted the following in the patient's chart:   . Medical and social history . Use of alcohol, tobacco or illicit drugs  . Current medications and supplements . Functional ability and status . Nutritional status . Physical activity . Advanced directives . List of other physicians . Hospitalizations, surgeries, and ER visits in previous 12 months . Vitals . Screenings to include cognitive, depression, and falls . Referrals and appointments  In addition, I have reviewed and discussed with patient certain preventive protocols, quality metrics, and best practice recommendations. A written personalized care plan for preventive services as well as general preventive health recommendations were provided to patient.     Lonn Georgia, LPN   05/69/7948   Nurse Notes: AWV conducted over the phone with pt in the home, and provider in the office visit. Visit time: about 20 min. Pt verbalizes consent to telehealth via audio.

## 2020-02-14 ENCOUNTER — Ambulatory Visit (HOSPITAL_COMMUNITY): Payer: Medicare HMO

## 2020-02-17 ENCOUNTER — Other Ambulatory Visit: Payer: Self-pay

## 2020-02-17 ENCOUNTER — Ambulatory Visit (HOSPITAL_COMMUNITY)
Admission: RE | Admit: 2020-02-17 | Discharge: 2020-02-17 | Disposition: A | Discharge status: Home / Self Care | Payer: Medicare HMO | Source: Ambulatory Visit | Attending: Family Medicine | Admitting: Family Medicine

## 2020-02-17 DIAGNOSIS — Z1231 Encounter for screening mammogram for malignant neoplasm of breast: Secondary | ICD-10-CM | POA: Insufficient documentation

## 2020-02-18 ENCOUNTER — Ambulatory Visit (INDEPENDENT_AMBULATORY_CARE_PROVIDER_SITE_OTHER): Payer: Medicare HMO

## 2020-02-18 DIAGNOSIS — Z23 Encounter for immunization: Secondary | ICD-10-CM

## 2020-03-06 ENCOUNTER — Other Ambulatory Visit: Payer: Self-pay | Admitting: Family Medicine

## 2020-04-27 ENCOUNTER — Encounter: Payer: Self-pay | Admitting: Family Medicine

## 2020-04-27 ENCOUNTER — Telehealth (INDEPENDENT_AMBULATORY_CARE_PROVIDER_SITE_OTHER): Payer: Medicare HMO | Admitting: Family Medicine

## 2020-04-27 ENCOUNTER — Other Ambulatory Visit: Payer: Self-pay

## 2020-04-27 VITALS — BP 112/75 | Resp 15 | Ht 66.0 in | Wt 214.0 lb

## 2020-04-27 DIAGNOSIS — I10 Essential (primary) hypertension: Secondary | ICD-10-CM | POA: Diagnosis not present

## 2020-04-27 NOTE — Progress Notes (Signed)
Virtual Visit via Telephone Note  I connected with Teresa Arellano on 04/27/20 at  1:40 PM EST by telephone and verified that I am speaking with the correct person using two identifiers.  Location: Patient: home Provider: work   I discussed the limitations, risks, security and privacy concerns of performing an evaluation and management service by telephone and the availability of in person appointments. I also discussed with the patient that there may be a patient responsible charge related to this service. The patient expressed understanding and agreed to proceed.   History of Present Illness: F/u chronic problems , review and update health maintainace Denies recent fever or chills. Denies sinus pressure, nasal congestion, ear pain or sore throat. Denies chest congestion, productive cough or wheezing. Denies chest pains, palpitations and leg swelling Denies abdominal pain, nausea, vomiting,diarrhea or constipation.   Denies dysuria, frequency, hesitancy or incontinence. Denies joint pain, swelling and limitation in mobility. Denies headaches, seizures, numbness, or tingling. Denies depression, anxiety or insomnia. Denies skin break down or rash.       Observations/Objective:  BP 112/75   Resp 15   Ht 5\' 6"  (1.676 m)   Wt 214 lb (97.1 kg)   BMI 34.54 kg/m  Good communication with no confusion and intact memory. Alert and oriented x 3 No signs of respiratory distress during speech   Assessment and Plan: Essential hypertension, benign Controlled, no change in medication DASH diet and commitment to daily physical activity for a minimum of 30 minutes discussed and encouraged, as a part of hypertension management. The importance of attaining a healthy weight is also discussed.  BP/Weight 04/27/2020 02/03/2020 11/25/2019 10/23/2019 09/24/2019 09/03/2019 8/75/6433  Systolic BP 295 188 416 606 301 601 -  Diastolic BP 75 82 82 75 96 84 -  Wt. (Lbs) 214 221 221 222.04 221.5 226 224   BMI 34.54 35.67 35.67 35.84 35.75 36.48 36.15       Obesity  Patient re-educated about  the importance of commitment to a  minimum of 150 minutes of exercise per week as able.  The importance of healthy food choices with portion control discussed, as well as eating regularly and within a 12 hour window most days. The need to choose "clean , Schneiderman" food 50 to 75% of the time is discussed, as well as to make water the primary drink and set a goal of 64 ounces water daily.    Weight /BMI 04/27/2020 02/03/2020 11/25/2019  WEIGHT 214 lb 221 lb 221 lb  HEIGHT 5\' 6"  5\' 6"  5\' 6"   BMI 34.54 kg/m2 35.67 kg/m2 35.67 kg/m2        Follow Up Instructions:    I discussed the assessment and treatment plan with the patient. The patient was provided an opportunity to ask questions and all were answered. The patient agreed with the plan and demonstrated an understanding of the instructions.   The patient was advised to call back or seek an in-person evaluation if the symptoms worsen or if the condition fails to improve as anticipated.  I provided 15  minutes of non-face-to-face time during this encounter.   Tula Nakayama, MD

## 2020-04-27 NOTE — Patient Instructions (Signed)
F/U in office with MD early July, re evaluate weight and blood pressure, call if you need me sooner  No changes in medication  Please continue to work on healthy habits , daily exercise for 30 minutes and increase vegetable and fruit intake, reduce sweets and fatty foods  Thanks for choosing [facility], we consider it a privelige to serve you.

## 2020-04-27 NOTE — Assessment & Plan Note (Signed)
Controlled, no change in medication DASH diet and commitment to daily physical activity for a minimum of 30 minutes discussed and encouraged, as a part of hypertension management. The importance of attaining a healthy weight is also discussed.  BP/Weight 04/27/2020 02/03/2020 11/25/2019 10/23/2019 09/24/2019 09/03/2019 4/00/8676  Systolic BP 195 093 267 124 580 998 -  Diastolic BP 75 82 82 75 96 84 -  Wt. (Lbs) 214 221 221 222.04 221.5 226 224  BMI 34.54 35.67 35.67 35.84 35.75 36.48 36.15

## 2020-04-27 NOTE — Assessment & Plan Note (Signed)
  Patient re-educated about  the importance of commitment to a  minimum of 150 minutes of exercise per week as able.  The importance of healthy food choices with portion control discussed, as well as eating regularly and within a 12 hour window most days. The need to choose "clean , [place]" food 50 to 75% of the time is discussed, as well as to make water the primary drink and set a goal of 64 ounces water daily.    Weight /BMI 04/27/2020 02/03/2020 11/25/2019  WEIGHT 214 lb 221 lb 221 lb  HEIGHT 5\' 6"  5\' 6"  5\' 6"   BMI 34.54 kg/m2 35.67 kg/m2 35.67 kg/m2

## 2020-05-08 DIAGNOSIS — H43813 Vitreous degeneration, bilateral: Secondary | ICD-10-CM | POA: Diagnosis not present

## 2020-05-24 ENCOUNTER — Other Ambulatory Visit: Payer: Self-pay | Admitting: Family Medicine

## 2020-06-25 ENCOUNTER — Other Ambulatory Visit: Payer: Self-pay | Admitting: Family Medicine

## 2020-09-11 DIAGNOSIS — Z6834 Body mass index (BMI) 34.0-34.9, adult: Secondary | ICD-10-CM | POA: Diagnosis not present

## 2020-09-11 DIAGNOSIS — I10 Essential (primary) hypertension: Secondary | ICD-10-CM | POA: Diagnosis not present

## 2020-09-11 DIAGNOSIS — M810 Age-related osteoporosis without current pathological fracture: Secondary | ICD-10-CM | POA: Diagnosis not present

## 2020-09-11 DIAGNOSIS — Z803 Family history of malignant neoplasm of breast: Secondary | ICD-10-CM | POA: Diagnosis not present

## 2020-09-11 DIAGNOSIS — E669 Obesity, unspecified: Secondary | ICD-10-CM | POA: Diagnosis not present

## 2020-09-11 DIAGNOSIS — Z7983 Long term (current) use of bisphosphonates: Secondary | ICD-10-CM | POA: Diagnosis not present

## 2020-09-11 DIAGNOSIS — Z8249 Family history of ischemic heart disease and other diseases of the circulatory system: Secondary | ICD-10-CM | POA: Diagnosis not present

## 2020-09-11 DIAGNOSIS — Z823 Family history of stroke: Secondary | ICD-10-CM | POA: Diagnosis not present

## 2020-09-11 DIAGNOSIS — Z833 Family history of diabetes mellitus: Secondary | ICD-10-CM | POA: Diagnosis not present

## 2020-10-26 ENCOUNTER — Ambulatory Visit: Payer: Medicare HMO | Admitting: Family Medicine

## 2020-10-30 ENCOUNTER — Ambulatory Visit (INDEPENDENT_AMBULATORY_CARE_PROVIDER_SITE_OTHER): Payer: Medicare HMO | Admitting: Family Medicine

## 2020-10-30 ENCOUNTER — Encounter: Payer: Self-pay | Admitting: Family Medicine

## 2020-10-30 ENCOUNTER — Other Ambulatory Visit: Payer: Self-pay

## 2020-10-30 ENCOUNTER — Other Ambulatory Visit: Payer: Self-pay | Admitting: Family Medicine

## 2020-10-30 VITALS — BP 129/79 | HR 73 | Resp 16 | Ht 66.0 in | Wt 219.0 lb

## 2020-10-30 DIAGNOSIS — E663 Overweight: Secondary | ICD-10-CM

## 2020-10-30 DIAGNOSIS — Z13220 Encounter for screening for lipoid disorders: Secondary | ICD-10-CM

## 2020-10-30 DIAGNOSIS — E559 Vitamin D deficiency, unspecified: Secondary | ICD-10-CM | POA: Diagnosis not present

## 2020-10-30 DIAGNOSIS — M8580 Other specified disorders of bone density and structure, unspecified site: Secondary | ICD-10-CM

## 2020-10-30 DIAGNOSIS — I10 Essential (primary) hypertension: Secondary | ICD-10-CM

## 2020-10-30 MED ORDER — POTASSIUM CHLORIDE ER 10 MEQ PO TBCR: 10.0000 meq | EXTENDED_RELEASE_TABLET | Freq: Every day | ORAL | 1 refills | Status: DC

## 2020-10-30 MED ORDER — ALENDRONATE SODIUM 70 MG PO TABS: 70.0000 mg | ORAL_TABLET | ORAL | 11 refills | Status: DC

## 2020-10-30 NOTE — Patient Instructions (Addendum)
Annual exam in office with MD in September, call if you need me sooner. Flu vaccine at visit  Labs today, CBC, lipid, cmp and eGFr, tSH, vit D  Please schedule mammogram at checkout  Please get covid booster, overdue  Need shingrix vaccine, check pharmacy  Thanks for choosing [facility], we consider it a privelige to serve you.

## 2020-10-31 LAB — CBC WITH DIFFERENTIAL/PLATELET
Basophils Absolute: 0.1 10*3/uL (ref 0.0–0.2)
Basos: 1 %
EOS (ABSOLUTE): 0.1 10*3/uL (ref 0.0–0.4)
Eos: 1 %
Hematocrit: 39.4 % (ref 34.0–46.6)
Hemoglobin: 13.1 g/dL (ref 11.1–15.9)
Immature Grans (Abs): 0 10*3/uL (ref 0.0–0.1)
Immature Granulocytes: 0 %
Lymphocytes Absolute: 3.2 10*3/uL — ABNORMAL HIGH (ref 0.7–3.1)
Lymphs: 41 %
MCH: 25.9 pg — ABNORMAL LOW (ref 26.6–33.0)
MCHC: 33.2 g/dL (ref 31.5–35.7)
MCV: 78 fL — ABNORMAL LOW (ref 79–97)
Monocytes Absolute: 0.7 10*3/uL (ref 0.1–0.9)
Monocytes: 9 %
Neutrophils Absolute: 3.7 10*3/uL (ref 1.4–7.0)
Neutrophils: 48 %
Platelets: 295 10*3/uL (ref 150–450)
RBC: 5.05 x10E6/uL (ref 3.77–5.28)
RDW: 13.2 % (ref 11.7–15.4)
WBC: 7.8 10*3/uL (ref 3.4–10.8)

## 2020-10-31 LAB — CMP14+EGFR
ALT: 17 IU/L (ref 0–32)
AST: 17 IU/L (ref 0–40)
Albumin/Globulin Ratio: 1.4 (ref 1.2–2.2)
Albumin: 4.4 g/dL (ref 3.8–4.8)
Alkaline Phosphatase: 63 IU/L (ref 44–121)
BUN/Creatinine Ratio: 19 (ref 12–28)
BUN: 15 mg/dL (ref 8–27)
Bilirubin Total: 0.5 mg/dL (ref 0.0–1.2)
CO2: 23 mmol/L (ref 20–29)
Calcium: 9.5 mg/dL (ref 8.7–10.3)
Chloride: 104 mmol/L (ref 96–106)
Creatinine, Ser: 0.79 mg/dL (ref 0.57–1.00)
Globulin, Total: 3.1 g/dL (ref 1.5–4.5)
Glucose: 92 mg/dL (ref 65–99)
Potassium: 3.8 mmol/L (ref 3.5–5.2)
Sodium: 143 mmol/L (ref 134–144)
Total Protein: 7.5 g/dL (ref 6.0–8.5)
eGFR: 81 mL/min/{1.73_m2} (ref >59)

## 2020-10-31 LAB — LIPID PANEL
Chol/HDL Ratio: 3.1 ratio (ref 0.0–4.4)
Cholesterol, Total: 184 mg/dL (ref 100–199)
HDL: 59 mg/dL (ref >39)
LDL Chol Calc (NIH): 112 mg/dL — ABNORMAL HIGH (ref 0–99)
Triglycerides: 68 mg/dL (ref 0–149)
VLDL Cholesterol Cal: 13 mg/dL (ref 5–40)

## 2020-10-31 LAB — TSH: TSH: 0.617 u[IU]/mL (ref 0.450–4.500)

## 2020-10-31 LAB — VITAMIN D 25 HYDROXY (VIT D DEFICIENCY, FRACTURES): Vit D, 25-Hydroxy: 29.8 ng/mL — ABNORMAL LOW (ref 30.0–100.0)

## 2020-11-01 ENCOUNTER — Encounter: Payer: Self-pay | Admitting: Family Medicine

## 2020-11-01 NOTE — Assessment & Plan Note (Signed)
Updated lab needed at/ before next visit.   

## 2020-11-01 NOTE — Assessment & Plan Note (Signed)
Controlled, no change in medication DASH diet and commitment to daily physical activity for a minimum of 30 minutes discussed and encouraged, as a part of hypertension management. The importance of attaining a healthy weight is also discussed.  BP/Weight 10/30/2020 04/27/2020 02/03/2020 11/25/2019 10/23/2019 09/24/2019 [id number]  Systolic BP [id number] [id number]  Diastolic BP 79 75 82 82 75 96 84  Wt. (Lbs) 219 214 221 221 222.04 221.5 226  BMI 35.35 34.54 35.67 35.67 35.84 35.75 36.48

## 2020-11-01 NOTE — Assessment & Plan Note (Signed)
  Patient re-educated about  the importance of commitment to a  minimum of 150 minutes of exercise per week as able.  The importance of healthy food choices with portion control discussed, as well as eating regularly and within a 12 hour window most days. The need to choose "clean , [place]" food 50 to 75% of the time is discussed, as well as to make water the primary drink and set a goal of 64 ounces water daily.    Weight /BMI 10/30/2020 04/27/2020 02/03/2020  WEIGHT 219 lb 214 lb 221 lb  HEIGHT '5\' 6"'$  '5\' 6"'$  '5\' 6"'$   BMI 35.35 kg/m2 34.54 kg/m2 35.67 kg/m2

## 2020-11-01 NOTE — Assessment & Plan Note (Signed)
Continue weekly fosamax, with Calcium and vit D and re commit to daily walking

## 2020-11-01 NOTE — Progress Notes (Signed)
   Teresa Arellano     MRN: [id number]      DOB: 27-Aug-1950   HPI Teresa Arellano is here for follow up and re-evaluation of chronic medical conditions, medication management and review of any available recent lab and radiology data.  Preventive health is updated, specifically  Cancer screening and Immunization.   Questions or concerns regarding consultations or procedures which the PT has had in the interim are  addressed. The PT denies any adverse reactions to current medications since the last visit.  C/o excessive weight gain and plans to work on this  ROS Denies recent fever or chills. Denies sinus pressure, nasal congestion, ear pain or sore throat. Denies chest congestion, productive cough or wheezing. Denies chest pains, palpitations and leg swelling Denies abdominal pain, nausea, vomiting,diarrhea or constipation.   Denies dysuria, frequency, hesitancy or incontinence. Denies joint pain, swelling and limitation in mobility. Denies headaches, seizures, numbness, or tingling. Denies depression, anxiety or insomnia. Denies skin break down or rash.   PE  BP 129/79   Pulse 73   Resp 16   Ht '5\' 6"'$  (1.676 m)   Wt 219 lb (99.3 kg)   SpO2 97%   BMI 35.35 kg/m   Patient alert and oriented and in no cardiopulmonary distress.  HEENT: No facial asymmetry, EOMI,     Neck supple .  Chest: Clear to auscultation bilaterally.  CVS: S1, S2 no murmurs, no S3.Regular rate.  ABD: Soft non tender.   Ext: No edema  MS: Adequate ROM spine, shoulders, hips and knees.  Skin: Intact, no ulcerations or rash noted.  Psych: Good eye contact, normal affect. Memory intact not anxious or depressed appearing.  CNS: CN 2-12 intact, power,  normal throughout.no focal deficits noted.   Assessment & Plan  Essential hypertension, benign Controlled, no change in medication DASH diet and commitment to daily physical activity for a minimum of 30 minutes discussed and encouraged, as a part of  hypertension management. The importance of attaining a healthy weight is also discussed.  BP/Weight 10/30/2020 04/27/2020 02/03/2020 11/25/2019 10/23/2019 09/24/2019 [id number]  Systolic BP [id number] [id number]  Diastolic BP 79 75 82 82 75 96 84  Wt. (Lbs) 219 214 221 221 222.04 221.5 226  BMI 35.35 34.54 35.67 35.67 35.84 35.75 36.48       Vitamin D deficiency Updated lab needed at/ before next visit.   Osteopenia Continue weekly fosamax, with Calcium and vit D and re commit to daily walking  Morbid obesity ([place])  Patient re-educated about  the importance of commitment to a  minimum of 150 minutes of exercise per week as able.  The importance of healthy food choices with portion control discussed, as well as eating regularly and within a 12 hour window most days. The need to choose "clean , [place]" food 50 to 75% of the time is discussed, as well as to make water the primary drink and set a goal of 64 ounces water daily.    Weight /BMI 10/30/2020 04/27/2020 02/03/2020  WEIGHT 219 lb 214 lb 221 lb  HEIGHT '5\' 6"'$  '5\' 6"'$  '5\' 6"'$   BMI 35.35 kg/m2 34.54 kg/m2 35.67 kg/m2

## 2020-12-16 ENCOUNTER — Encounter (HOSPITAL_COMMUNITY): Payer: Self-pay

## 2020-12-16 ENCOUNTER — Emergency Department (HOSPITAL_COMMUNITY): Payer: Worker's Compensation

## 2020-12-16 ENCOUNTER — Other Ambulatory Visit: Payer: Self-pay

## 2020-12-16 ENCOUNTER — Emergency Department (HOSPITAL_COMMUNITY)
Admission: EM | Admit: 2020-12-16 | Discharge: 2020-12-16 | Disposition: A | Discharge status: Home / Self Care | Payer: Worker's Compensation | Attending: Emergency Medicine | Admitting: Emergency Medicine

## 2020-12-16 DIAGNOSIS — I10 Essential (primary) hypertension: Secondary | ICD-10-CM | POA: Insufficient documentation

## 2020-12-16 DIAGNOSIS — Y9301 Activity, walking, marching and hiking: Secondary | ICD-10-CM | POA: Diagnosis not present

## 2020-12-16 DIAGNOSIS — S6991XA Unspecified injury of right wrist, hand and finger(s), initial encounter: Secondary | ICD-10-CM | POA: Diagnosis present

## 2020-12-16 DIAGNOSIS — S52591A Other fractures of lower end of right radius, initial encounter for closed fracture: Secondary | ICD-10-CM | POA: Diagnosis not present

## 2020-12-16 DIAGNOSIS — S52501A Unspecified fracture of the lower end of right radius, initial encounter for closed fracture: Secondary | ICD-10-CM | POA: Diagnosis not present

## 2020-12-16 DIAGNOSIS — Z79899 Other long term (current) drug therapy: Secondary | ICD-10-CM | POA: Diagnosis not present

## 2020-12-16 DIAGNOSIS — W010XXA Fall on same level from slipping, tripping and stumbling without subsequent striking against object, initial encounter: Secondary | ICD-10-CM | POA: Insufficient documentation

## 2020-12-16 DIAGNOSIS — M7989 Other specified soft tissue disorders: Secondary | ICD-10-CM | POA: Diagnosis not present

## 2020-12-16 DIAGNOSIS — M25531 Pain in right wrist: Secondary | ICD-10-CM | POA: Diagnosis not present

## 2020-12-16 MED ORDER — HYDROCODONE-ACETAMINOPHEN 5-325 MG PO TABS
1.0000 | ORAL_TABLET | Freq: Once | ORAL | Status: AC
  Administered 2020-12-16: 1 via ORAL
  Filled 2020-12-16: qty 1

## 2020-12-16 MED ORDER — HYDROCODONE-ACETAMINOPHEN 5-325 MG PO TABS: 1.0000 | ORAL_TABLET | Freq: Four times a day (QID) | ORAL | 0 refills | Status: DC | PRN

## 2020-12-16 NOTE — ED Provider Notes (Signed)
[facility] EMERGENCY DEPARTMENT Provider Note   CSN: [id number] Arrival date & time: 12/16/20  1953     History Chief Complaint  Patient presents with   Wrist Reduction     Teresa Arellano is a 70 y.o. female.  She is here for evaluation of right wrist fracture after mechanical fall.  She says she was walking backwards talking to someone when she tripped over the concrete and fell back injuring her right wrist.  She denies any other injuries or complaints.  No numbness.  Denies striking her head.  Not on blood thinners.  She went to urgent care where they took x-rays put her in a sling and told her to come to the emergency department.  The history is provided by the patient.  Wrist Pain This is a new problem. The current episode started 3 to 5 hours ago. The problem occurs constantly. The problem has not changed since onset.Pertinent negatives include no chest pain, no abdominal pain, no headaches and no shortness of breath. The symptoms are aggravated by bending and twisting. Nothing relieves the symptoms. She has tried rest for the symptoms. The treatment provided no relief.      Past Medical History:  Diagnosis Date   Cataract    Hypertension 2014    Patient Active Problem List   Diagnosis Date Noted   Knee pain 01/29/2019   Hematuria, undiagnosed cause 11/25/2016   Osteopenia 11/22/2016   Vitamin D deficiency 05/21/2016   Morbid obesity ([place]) 09/21/2013   Essential hypertension, benign 09/10/2013    Past Surgical History:  Procedure Laterality Date   ABDOMINAL HYSTERECTOMY  1982   partial, fibroids   CATARACT EXTRACTION W/PHACO Right 05/02/2016   Procedure: CATARACT EXTRACTION PHACO AND INTRAOCULAR LENS PLACEMENT ([place]);  Surgeon: Tonny Branch, MD;  Location: [facility];  Service: Ophthalmology;  Laterality: Right;  CDE: 5.35   CATARACT EXTRACTION W/PHACO Left 05/23/2016   Procedure: CATARACT EXTRACTION PHACO AND INTRAOCULAR LENS PLACEMENT LEFT EYE;  Surgeon: Tonny Branch, MD;   Location: [facility];  Service: Ophthalmology;  Laterality: Left;  CDE:  5.45   CHOLECYSTECTOMY  1991 approx   COLONOSCOPY N/A 04/06/2016   Procedure: COLONOSCOPY;  Surgeon: Danie Binder, MD;  Location: [facility];  Service: Endoscopy;  Laterality: N/A;  10:30 Am   POLYPECTOMY  04/06/2016   Procedure: POLYPECTOMY;  Surgeon: Danie Binder, MD;  Location: [facility];  Service: Endoscopy;;  hepatic flexure polypectomy;   TUBAL LIGATION  1979     OB History   No obstetric history on file.     Family History  Problem Relation Age of Onset   Heart disease Mother    Diabetes Father    Heart disease Brother    Cancer Sister 23       breast    Cancer Sister        stomach    Heart disease Brother    Hypertension Sister    Hypertension Sister    Diabetes Sister    Diabetes Sister     Social History   Tobacco Use   Smoking status: Never   Smokeless tobacco: Never  Substance Use Topics   Alcohol use: No   Drug use: No    Home Medications Prior to Admission medications   Medication Sig Start Date End Date Taking? Authorizing Provider  alendronate (FOSAMAX) 70 MG tablet Take 1 tablet (70 mg total) by mouth every 7 (seven) days. Take with a full glass of water on an  empty stomach. 10/30/20   Fayrene Helper, MD  CALCIUM PO Take 1 tablet by mouth daily.    [provider]  famotidine (PEPCID) 40 MG tablet Take 1 tablet (40 mg total) by mouth daily. 11/25/19   Fayrene Helper, MD  potassium chloride (KLOR-CON) 10 MEQ tablet Take 1 tablet (10 mEq total) by mouth daily. 10/30/20   Fayrene Helper, MD  triamterene-hydrochlorothiazide [facility]) 37.5-25 MG tablet Take 1 tablet by mouth once daily 10/30/20   Fayrene Helper, MD  VITAMIN D PO Take 1 capsule by mouth daily.    [provider]    Allergies    Patient has no known allergies.  Review of Systems   Review of Systems  Respiratory:  Negative for shortness of breath.   Cardiovascular:   Negative for chest pain.  Gastrointestinal:  Negative for abdominal pain.  Musculoskeletal:  Negative for back pain and neck pain.  Skin:  Negative for wound.  Neurological:  Negative for headaches.   Physical Exam Updated Vital Signs BP (!) 148/81 (BP Location: Left Arm)   Pulse 86   Temp 98.6 F (37 C)   Resp 19   Ht '5\' 6"'$  (1.676 m)   Wt 127 kg   SpO2 96%   BMI 45.19 kg/m   Physical Exam Constitutional:      Appearance: Normal appearance. She is well-developed.  HENT:     Head: Normocephalic and atraumatic.  Eyes:     Conjunctiva/sclera: Conjunctivae normal.  Cardiovascular:     Rate and Rhythm: Normal rate and regular rhythm.  Pulmonary:     Effort: Pulmonary effort is normal.     Breath sounds: Normal breath sounds.  Musculoskeletal:        General: Swelling, tenderness, deformity and signs of injury present.     Cervical back: Neck supple.     Comments: Has an elevated deformity of her right wrist.  Elbow shoulder nontender.  Able to move fingers.  Cap refill sensory and motor intact.  Radial pulse 2+.  No open wounds.  Other extremities full range of motion without any pain or limitations.  Skin:    General: Skin is warm and dry.  Neurological:     General: No focal deficit present.     Mental Status: She is alert.     GCS: GCS eye subscore is 4. GCS verbal subscore is 5. GCS motor subscore is 6.    ED Results / Procedures / Treatments   Labs (all labs ordered are listed, but only abnormal results are displayed) Labs Reviewed - No data to display  EKG None  Radiology DG Wrist Complete Right  Result Date: 12/16/2020 CLINICAL DATA:  Fall, right wrist pain EXAM: RIGHT WRIST - COMPLETE 3+ VIEW COMPARISON:  None. FINDINGS: Three view radiograph right wrist demonstrates a a comminuted, transverse, impacted fracture of the distal right radiusl with roughly 25 degrees dorsal angulation of the distal fracture fragment and resultant marked dorsal angulation of the  distal radial articular surface. A longitudinal fracture plane is also identified extending into the lunate fossa of the distal radial articular surface with and articular gap identified. Secondary ulnar positive variance. Radiocarpal alignment is preserved. Extensive surrounding soft tissue swelling is present. IMPRESSION: Comminuted, transverse impacted dorsally angulated fracture of the distal right radius as described above. Electronically Signed   By: Fidela Salisbury M.D.   On: 12/16/2020 21:08    Procedures Procedures   Medications Ordered in ED Medications  HYDROcodone-acetaminophen (NORCO/VICODIN)  5-325 MG per tablet 1 tablet (has no administration in time range)    ED Course  I have reviewed the triage vital signs and the nursing notes.  Pertinent labs & imaging results that were available during my care of the patient were reviewed by me and considered in my medical decision making (see chart for details).  Clinical Course as of 12/17/20 1000  Wed Dec 16, 2020  2121 Discussed with Dr. Apolonio Schneiders from orthopedics.  He recommends splint her and have her follow-up in the office on Friday. [MB]  2139 Splint placed by ED techs.  Good position and distal CSM's intact. [MB]    Clinical Course User Index [MB] Hayden Rasmussen, MD   MDM Rules/Calculators/A&P                           Differential diagnosis includes fracture, dislocation, contusion, sprain.  X-ray ordered and interpreted by me as comminuted distal radius fracture with intra-articular extension.  Discussed with hand consultant Dr. Caralyn Guile who recommended splint and follow-up in office.  Reviewed with patient.  Patient placed in splint by ED tech.  Return instructions discussed. Final Clinical Impression(s) / ED Diagnoses Final diagnoses:  Closed fracture of distal end of right radius, unspecified fracture morphology, initial encounter    Rx / DC Orders ED Discharge Orders          Ordered    HYDROcodone-acetaminophen  (NORCO/VICODIN) 5-325 MG tablet  Every 6 hours PRN        12/16/20 2141             Hayden Rasmussen, MD 12/17/20 1002

## 2020-12-16 NOTE — Discharge Instructions (Addendum)
You were seen in the emergency department for evaluation of a fracture to your right wrist.  You were placed in a splint.  Please keep this on clean and dry.  We are prescribing you some pain medication to use as needed.  Be advised this may make you dizzy and constipated.  Call Dr. Lequita Asal office tomorrow to get an appointment for Friday.  Return to the emergency department if any worsening or concerning symptoms

## 2020-12-16 NOTE — ED Triage Notes (Signed)
Pov from home with cc of right wrist fracture. Tripped over concrete and fell back. Went to [facility] and has a scan. Was sent here because she will need it fixed.

## 2020-12-18 ENCOUNTER — Encounter: Payer: Medicare HMO | Admitting: Family Medicine

## 2020-12-21 ENCOUNTER — Encounter: Payer: Self-pay | Admitting: Orthopedic Surgery

## 2020-12-21 ENCOUNTER — Ambulatory Visit: Payer: 59 | Admitting: Orthopedic Surgery

## 2020-12-21 ENCOUNTER — Other Ambulatory Visit: Payer: Self-pay

## 2020-12-21 ENCOUNTER — Telehealth: Payer: Self-pay | Admitting: Orthopedic Surgery

## 2020-12-21 VITALS — BP 162/111 | HR 85 | Ht 66.0 in | Wt 219.6 lb

## 2020-12-21 DIAGNOSIS — S52531A Colles' fracture of right radius, initial encounter for closed fracture: Secondary | ICD-10-CM

## 2020-12-21 NOTE — H&P (View-Only) (Signed)
New Patient Visit  Assessment: Nakedra Mekeel is a RHD 70 y.o. female with the following: Right distal radius fracture, with severe dorsal tilt of the joint line.  Plan: I had extensive discussion with the patient in clinic today in regards to her right wrist.  She is right-hand dominant, and continues to work.  Radiographs outlined a distal radius fracture, with severe dorsal tilt, compared to baseline normal.  In order to restore form and function in this healthy, active 70 year old female, I have recommended operative fixation.   Risks and benefits of surgery, including, but not limited to infection, bleeding, persistent pain, damage to surrounding structures, need for further surgery, malunion, nonunion and more severe complications associated with anesthesia were discussed.  All questions have been answered and they have elected to proceed with surgery.    We will plan to proceed with surgery on December 31, 2020.  Surgical Plan  Procedure: Operative fixation of right distal radius fracture Disposition: Outpatient Anesthesia: Choice; General, possible block Medical Comorbidities: Minimal  Follow-up: Return for After surgery; DOS 12/31/20.  Subjective:  Chief Complaint  Patient presents with   Wrist Injury    Fractured right wrist//DOI 12/16/20//pt fell at work walking backwards    History of Present Illness: Breyanna Caldon is a 70 y.o. female who presents for evaluation of a right wrist injury.  She states that she was taking a few steps backwards at work, when she lost her balance.  She landed directly onto an outstretched arm.  She noted immediate pain and swelling.  She presented to the emergency department, who placed her in a stabilizing splint.  No numbness and tingling in her right hand.  She has been taking over-the-counter medications as needed.  Her pain continues to be well controlled.   Review of Systems: No fevers or chills No numbness or tingling No chest  pain No shortness of breath No bowel or bladder dysfunction No GI distress No headaches   Medical History:  Past Medical History:  Diagnosis Date   Cataract    Hypertension 2014    Past Surgical History:  Procedure Laterality Date   ABDOMINAL HYSTERECTOMY  1982   partial, fibroids   CATARACT EXTRACTION W/PHACO Right 05/02/2016   Procedure: CATARACT EXTRACTION PHACO AND INTRAOCULAR LENS PLACEMENT ([place]);  Surgeon: Tonny Branch, MD;  Location: [facility];  Service: Ophthalmology;  Laterality: Right;  CDE: 5.35   CATARACT EXTRACTION W/PHACO Left 05/23/2016   Procedure: CATARACT EXTRACTION PHACO AND INTRAOCULAR LENS PLACEMENT LEFT EYE;  Surgeon: Tonny Branch, MD;  Location: [facility];  Service: Ophthalmology;  Laterality: Left;  CDE:  5.45   CHOLECYSTECTOMY  1991 approx   COLONOSCOPY N/A 04/06/2016   Procedure: COLONOSCOPY;  Surgeon: Danie Binder, MD;  [facility];  Service: Endoscopy;  Laterality: N/A;  10:30 Am   POLYPECTOMY  04/06/2016   Procedure: POLYPECTOMY;  Surgeon: Danie Binder, MD;  [facility];  Service: Endoscopy;;  hepatic flexure polypectomy;   TUBAL LIGATION  1979    Family History  Problem Relation Age of Onset   Heart disease Mother    Diabetes Father    Heart disease Brother    Cancer Sister 2       breast    Cancer Sister        stomach    Heart disease Brother    Hypertension Sister    Hypertension Sister    Diabetes Sister    Diabetes Sister    Social History  Tobacco Use   Smoking status: Never   Smokeless tobacco: Never  Substance Use Topics   Alcohol use: No   Drug use: No    No Known Allergies  Current Meds  Medication Sig   alendronate (FOSAMAX) 70 MG tablet Take 1 tablet (70 mg total) by mouth every 7 (seven) days. Take with a full glass of water on an empty stomach.   CALCIUM PO Take 1 tablet by mouth daily.   famotidine (PEPCID) 40 MG tablet Take 1 tablet (40 mg total) by mouth daily.   HYDROcodone-acetaminophen  (NORCO/VICODIN) 5-325 MG tablet Take 1 tablet by mouth every 6 (six) hours as needed.   potassium chloride (KLOR-CON) 10 MEQ tablet Take 1 tablet (10 mEq total) by mouth daily.   triamterene-hydrochlorothiazide (MAXZIDE-25) 37.5-25 MG tablet Take 1 tablet by mouth once daily   VITAMIN D PO Take 1 capsule by mouth daily.    Objective: BP (!) 162/111   Pulse 85   Ht '5\' 6"'$  (1.676 m)   Wt 219 lb 9.6 oz (99.6 kg)   BMI 35.44 kg/m   Physical Exam:  General: Alert and oriented. and No acute distress. Gait: Normal gait.  Sugar-tong splint remains in good position.  The exposed fingers are swollen.  Sensation is intact throughout the right hand.  She is able to wiggle all fingers.  No evidence of skin breakdown at the periphery of the splint.  2+ radial pulse.  IMAGING: I personally reviewed images previously obtained from the ED  X-rays of the right wrist were obtained in the emergency department and demonstrates an extra-articular fracture with greater than 25 degrees of dorsal tilt.  Minimal obvious comminution of the dorsal cortex.  No additional injuries are noted.  New Medications:  No orders of the defined types were placed in this encounter.     Mordecai Rasmussen, MD  12/21/2020 11:03 PM

## 2020-12-21 NOTE — Telephone Encounter (Signed)
Note issued regarding work; patient aware her follow up appointment is to be determined after surgery date 12/31/2020.

## 2020-12-21 NOTE — Progress Notes (Signed)
New Patient Visit  Assessment: Teresa Arellano is a RHD 70 y.o. female with the following: Right distal radius fracture, with severe dorsal tilt of the joint line.  Plan: I had extensive discussion with the patient in clinic today in regards to her right wrist.  She is right-hand dominant, and continues to work.  Radiographs outlined a distal radius fracture, with severe dorsal tilt, compared to baseline normal.  In order to restore form and function in this healthy, active 70 year old female, I have recommended operative fixation.   Risks and benefits of surgery, including, but not limited to infection, bleeding, persistent pain, damage to surrounding structures, need for further surgery, malunion, nonunion and more severe complications associated with anesthesia were discussed.  All questions have been answered and they have elected to proceed with surgery.    We will plan to proceed with surgery on December 31, 2020.  Surgical Plan  Procedure: Operative fixation of right distal radius fracture Disposition: Outpatient Anesthesia: Choice; General, possible block Medical Comorbidities: Minimal  Follow-up: Return for After surgery; DOS 12/31/20.  Subjective:  Chief Complaint  Patient presents with   Wrist Injury    Fractured right wrist//DOI 12/16/20//pt fell at work walking backwards    History of Present Illness: Teresa Arellano is a 70 y.o. female who presents for evaluation of a right wrist injury.  She states that she was taking a few steps backwards at work, when she lost her balance.  She landed directly onto an outstretched arm.  She noted immediate pain and swelling.  She presented to the emergency department, who placed her in a stabilizing splint.  No numbness and tingling in her right hand.  She has been taking over-the-counter medications as needed.  Her pain continues to be well controlled.   Review of Systems: No fevers or chills No numbness or tingling No chest  pain No shortness of breath No bowel or bladder dysfunction No GI distress No headaches   Medical History:  Past Medical History:  Diagnosis Date   Cataract    Hypertension 2014    Past Surgical History:  Procedure Laterality Date   ABDOMINAL HYSTERECTOMY  1982   partial, fibroids   CATARACT EXTRACTION W/PHACO Right 05/02/2016   Procedure: CATARACT EXTRACTION PHACO AND INTRAOCULAR LENS PLACEMENT ([place]);  Surgeon: Tonny Branch, MD;  Location: [facility];  Service: Ophthalmology;  Laterality: Right;  CDE: 5.35   CATARACT EXTRACTION W/PHACO Left 05/23/2016   Procedure: CATARACT EXTRACTION PHACO AND INTRAOCULAR LENS PLACEMENT LEFT EYE;  Surgeon: Tonny Branch, MD;  Location: [facility];  Service: Ophthalmology;  Laterality: Left;  CDE:  5.45   CHOLECYSTECTOMY  1991 approx   COLONOSCOPY N/A 04/06/2016   Procedure: COLONOSCOPY;  Surgeon: Danie Binder, MD;  [facility];  Service: Endoscopy;  Laterality: N/A;  10:30 Am   POLYPECTOMY  04/06/2016   Procedure: POLYPECTOMY;  Surgeon: Danie Binder, MD;  [facility];  Service: Endoscopy;;  hepatic flexure polypectomy;   TUBAL LIGATION  1979    Family History  Problem Relation Age of Onset   Heart disease Mother    Diabetes Father    Heart disease Brother    Cancer Sister 37       breast    Cancer Sister        stomach    Heart disease Brother    Hypertension Sister    Hypertension Sister    Diabetes Sister    Diabetes Sister    Social History  Tobacco Use   Smoking status: Never   Smokeless tobacco: Never  Substance Use Topics   Alcohol use: No   Drug use: No    No Known Allergies  Current Meds  Medication Sig   alendronate (FOSAMAX) 70 MG tablet Take 1 tablet (70 mg total) by mouth every 7 (seven) days. Take with a full glass of water on an empty stomach.   CALCIUM PO Take 1 tablet by mouth daily.   famotidine (PEPCID) 40 MG tablet Take 1 tablet (40 mg total) by mouth daily.   HYDROcodone-acetaminophen  (NORCO/VICODIN) 5-325 MG tablet Take 1 tablet by mouth every 6 (six) hours as needed.   potassium chloride (KLOR-CON) 10 MEQ tablet Take 1 tablet (10 mEq total) by mouth daily.   triamterene-hydrochlorothiazide (MAXZIDE-25) 37.5-25 MG tablet Take 1 tablet by mouth once daily   VITAMIN D PO Take 1 capsule by mouth daily.    Objective: BP (!) 162/111   Pulse 85   Ht '5\' 6"'$  (1.676 m)   Wt 219 lb 9.6 oz (99.6 kg)   BMI 35.44 kg/m   Physical Exam:  General: Alert and oriented. and No acute distress. Gait: Normal gait.  Sugar-tong splint remains in good position.  The exposed fingers are swollen.  Sensation is intact throughout the right hand.  She is able to wiggle all fingers.  No evidence of skin breakdown at the periphery of the splint.  2+ radial pulse.  IMAGING: I personally reviewed images previously obtained from the ED  X-rays of the right wrist were obtained in the emergency department and demonstrates an extra-articular fracture with greater than 25 degrees of dorsal tilt.  Minimal obvious comminution of the dorsal cortex.  No additional injuries are noted.  New Medications:  No orders of the defined types were placed in this encounter.     Mordecai Rasmussen, MD  12/21/2020 11:03 PM

## 2020-12-24 NOTE — Patient Instructions (Signed)
Teresa Arellano  12/24/2020     '@PREFPERIOPPHARMACY'$ @   Your procedure is scheduled on  12/31/2020.   Report to [facility] at  [phone] A.M.   Call this number if you have problems the morning of surgery:  [phone]   Remember:  Do not eat or drink after midnight.      Take these medicines the morning of surgery with A SIP OF WATER                   pepcid, hydrocodone (if needed).     Do not wear jewelry, make-up or nail polish.  Do not wear lotions, powders, or perfumes, or deodorant.  Do not shave 48 hours prior to surgery.  Men may shave face and neck.  Do not bring valuables to the hospital.  [facility] is not responsible for any belongings or valuables.  Contacts, dentures or bridgework may not be worn into surgery.  Leave your suitcase in the car.  After surgery it may be brought to your room.  For patients admitted to the hospital, discharge time will be determined by your treatment team.  Patients discharged the day of surgery will not be allowed to drive home and must have someone with them for 24 hours.    Special instructions:   DO NOT smoke tobacco or vape fore 24 hours before your procedure.  Please read over the following fact sheets that you were given. Pain Booklet, Coughing and Deep Breathing, Anesthesia Post-op Instructions, and Care and Recovery After Surgery      Wrist Fracture Treated With ORIF, Care After This sheet gives you information about how to care for yourself after your procedure. Your health care provider may also give you more specific instructions. If you have problems or questions, contact your health care provider. What can I expect after the procedure? After the procedure, it is common to have: Pain. Swelling. Stiffness. A small amount of drainage from the incision. Follow these instructions at home: If you have a cast: Do not stick anything inside the cast to scratch your skin. Doing that increases your risk of  infection. Check the skin around the cast every day. Tell your health care provider about any concerns. You may put lotion on dry skin around the edges of the cast. Do not put lotion on the skin underneath the cast. Keep the cast clean and dry. If you have a splint or sling: Wear the splint or sling as told by your health care provider. Remove it only as told by your health care provider. Loosen the splint or sling if your fingers tingle, become numb, or turn cold and blue. Keep the splint or sling clean and dry. Bathing Do not take baths, swim, or use a hot tub until your health care provider approves. Ask your health care provider if you may take showers. You may only be allowed to take sponge baths. If your cast, splint, or sling is not waterproof: Do not let it get wet. Cover it with a watertight covering when you take a bath or shower. If you have a sling, remove it for bathing only if your health care provider tells you it is safe to do that. Keep the bandage (dressing) dry until your health care provider says it can be removed. Incision care  Follow instructions from your health care provider about how to take care of your incision. Make sure you: Wash your hands with soap  and water for at least 20 seconds before and after you change your dressing. If soap and water are not available, use hand sanitizer. Change your dressing as told by your health care provider. Leave stitches (sutures), skin glue, or adhesive strips in place. These skin closures may need to stay in place for 2 weeks or longer. If adhesive strip edges start to loosen and curl up, you may trim the loose edges. Do not remove adhesive strips completely unless your health care provider tells you to do that. Check your incision area every day for signs of infection. Check for: Redness. More swelling or pain. Blood or more fluid. Warmth. Pus or a bad smell. Managing pain, stiffness, and swelling  If directed, put ice on  the injured area. To do this: If you have a removable splint or sling, remove it as told by your health care provider. Put ice in a plastic bag. Place a towel between your skin and the bag or between your cast and the bag. Leave the ice on for 20 minutes, 2-3 times a day. Remove the ice if your skin turns bright red. This is very important. If you cannot feel pain, heat, or cold, you have a greater risk of damage to the area. Move your fingers often to reduce stiffness and swelling. Raise (elevate) the injured area above the level of your heart while you are sitting or lying down. Driving If you were given a sedative during the procedure, it can affect you for several hours. Do not drive or operate machinery until your health care provider says that it is safe. Ask your health care provider when it is safe to drive if you have a cast, splint, or sling on your wrist. Activity Return to your normal activities as told by your health care provider. Ask your health care provider what activities are safe for you. Do exercises as told by your health care provider. Do not lift with or put weight on your injured wrist until your health care provider approves. Avoid pulling and pushing. Medicines Take over-the-counter and prescription medicines only as told by your health care provider. Ask your health care provider if the medicine prescribed to you: Requires you to avoid driving or using machinery. Can cause constipation. You may need to take these actions to prevent or treat constipation: Drink enough fluid to keep your urine pale yellow. Take over-the-counter or prescription medicines. Eat foods that are high in fiber, such as beans, whole grains, and fresh fruits and vegetables. Limit foods that are high in fat and processed sugars, such as fried or sweet foods. General instructions Do not put pressure on any part of the cast or splint until it is fully hardened. This may take several hours. Do  not use any products that contain nicotine or tobacco, such as cigarettes, e-cigarettes, and chewing tobacco. These can delay bone healing after surgery. If you need help quitting, ask your health care provider. Keep all follow-up visits. This is important. Contact a health care provider if: Your cast, splint, or sling is damaged or loose. Your pain is not controlled with medicine. You have any of these signs of infection: A fever. Redness around your incision. More swelling or pain around your incision. Blood or more fluid coming from your incision. Warmth coming from your incision. Pus or a bad smell coming from your incision or your dressing. You develop a rash. Get help right away if: Your skin or fingers on your injured arm turn blue  or gray. Your arm feels cold or numb. You have severe pain in your injured wrist. You have trouble breathing. You feel faint or light-headed. Summary After the procedure, it is common to have pain, swelling, stiffness, and a small amount of drainage from the incision. You may use ice, elevation, and pain medicine as told by your health care provider to reduce pain and swelling. Wear your splint or sling as told by your health care provider. Do not lift with or put weight on your injured wrist until your health care provider approves. This information is not intended to replace advice given to you by your health care provider. Make sure you discuss any questions you have with your health care provider. Document Revised: 07/09/2019 Document Reviewed: 07/09/2019 Elsevier Patient Education  [place] Anesthesia, Adult, Care After This sheet gives you information about how to care for yourself after your procedure. Your health care provider may also give you more specific instructions. If you have problems or questions, contact your health care provider. What can I expect after the procedure? After the procedure, the following side effects  are common: Pain or discomfort at the IV site. Nausea. Vomiting. Sore throat. Trouble concentrating. Feeling cold or chills. Feeling weak or tired. Sleepiness and fatigue. Soreness and body aches. These side effects can affect parts of the body that were not involved in surgery. Follow these instructions at home: For the time period you were told by your health care provider:  Rest. Do not participate in activities where you could fall or become injured. Do not drive or use machinery. Do not drink alcohol. Do not take sleeping pills or medicines that cause drowsiness. Do not make important decisions or sign legal documents. Do not take care of children on your own. Eating and drinking Follow any instructions from your health care provider about eating or drinking restrictions. When you feel hungry, start by eating small amounts of foods that are soft and easy to digest (bland), such as toast. Gradually return to your regular diet. Drink enough fluid to keep your urine pale yellow. If you vomit, rehydrate by drinking water, juice, or clear broth. General instructions If you have sleep apnea, surgery and certain medicines can increase your risk for breathing problems. Follow instructions from your health care provider about wearing your sleep device: Anytime you are sleeping, including during daytime naps. While taking prescription pain medicines, sleeping medicines, or medicines that make you drowsy. Have a responsible adult stay with you for the time you are told. It is important to have someone help care for you until you are awake and alert. Return to your normal activities as told by your health care provider. Ask your health care provider what activities are safe for you. Take over-the-counter and prescription medicines only as told by your health care provider. If you smoke, do not smoke without supervision. Keep all follow-up visits as told by your health care provider. This is  important. Contact a health care provider if: You have nausea or vomiting that does not get better with medicine. You cannot eat or drink without vomiting. You have pain that does not get better with medicine. You are unable to pass urine. You develop a skin rash. You have a fever. You have redness around your IV site that gets worse. Get help right away if: You have difficulty breathing. You have chest pain. You have blood in your urine or stool, or you vomit blood. Summary After the procedure, it  is common to have a sore throat or nausea. It is also common to feel tired. Have a responsible adult stay with you for the time you are told. It is important to have someone help care for you until you are awake and alert. When you feel hungry, start by eating small amounts of foods that are soft and easy to digest (bland), such as toast. Gradually return to your regular diet. Drink enough fluid to keep your urine pale yellow. Return to your normal activities as told by your health care provider. Ask your health care provider what activities are safe for you. This information is not intended to replace advice given to you by your health care provider. Make sure you discuss any questions you have with your health care provider. Document Revised: 12/12/2019 Document Reviewed: 07/11/2019 Elsevier Patient Education  2022 [place]. How to Use Chlorhexidine for Bathing Chlorhexidine gluconate (CHG) is a germ-killing (antiseptic) solution that is used to clean the skin. It can get rid of the bacteria that normally live on the skin and can keep them away for about 24 hours. To clean your skin with CHG, you may be given: A CHG solution to use in the shower or as part of a sponge bath. A prepackaged cloth that contains CHG. Cleaning your skin with CHG may help lower the risk for infection: While you are staying in the intensive care unit of the hospital. If you have a vascular access, such as a  central line, to provide short-term or long-term access to your veins. If you have a catheter to drain urine from your bladder. If you are on a ventilator. A ventilator is a machine that helps you breathe by moving air in and out of your lungs. After surgery. What are the risks? Risks of using CHG include: A skin reaction. Hearing loss, if CHG gets in your ears and you have a perforated eardrum. Eye injury, if CHG gets in your eyes and is not rinsed out. The CHG product catching fire. Make sure that you avoid smoking and flames after applying CHG to your skin. Do not use CHG: If you have a chlorhexidine allergy or have previously reacted to chlorhexidine. On babies younger than 55 months of age. How to use CHG solution Use CHG only as told by your health care provider, and follow the instructions on the label. Use the full amount of CHG as directed. Usually, this is one bottle. During a shower Follow these steps when using CHG solution during a shower (unless your health care provider gives you different instructions): Start the shower. Use your normal soap and shampoo to wash your face and hair. Turn off the shower or move out of the shower stream. Pour the CHG onto a clean washcloth. Do not use any type of brush or rough-edged sponge. Starting at your neck, lather your body down to your toes. Make sure you follow these instructions: If you will be having surgery, pay special attention to the part of your body where you will be having surgery. Scrub this area for at least 1 minute. Do not use CHG on your head or face. If the solution gets into your ears or eyes, rinse them well with water. Avoid your genital area. Avoid any areas of skin that have broken skin, cuts, or scrapes. Scrub your back and under your arms. Make sure to wash skin folds. Let the lather sit on your skin for 1-2 minutes or as long as told by your  health care provider. Thoroughly rinse your entire body in the shower.  Make sure that all body creases and crevices are rinsed well. Dry off with a clean towel. Do not put any substances on your body afterward--such as powder, lotion, or perfume--unless you are told to do so by your health care provider. Only use lotions that are recommended by the manufacturer. Put on clean clothes or pajamas. If it is the night before your surgery, sleep in clean sheets.  During a sponge bath Follow these steps when using CHG solution during a sponge bath (unless your health care provider gives you different instructions): Use your normal soap and shampoo to wash your face and hair. Pour the CHG onto a clean washcloth. Starting at your neck, lather your body down to your toes. Make sure you follow these instructions: If you will be having surgery, pay special attention to the part of your body where you will be having surgery. Scrub this area for at least 1 minute. Do not use CHG on your head or face. If the solution gets into your ears or eyes, rinse them well with water. Avoid your genital area. Avoid any areas of skin that have broken skin, cuts, or scrapes. Scrub your back and under your arms. Make sure to wash skin folds. Let the lather sit on your skin for 1-2 minutes or as long as told by your health care provider. Using a different clean, wet washcloth, thoroughly rinse your entire body. Make sure that all body creases and crevices are rinsed well. Dry off with a clean towel. Do not put any substances on your body afterward--such as powder, lotion, or perfume--unless you are told to do so by your health care provider. Only use lotions that are recommended by the manufacturer. Put on clean clothes or pajamas. If it is the night before your surgery, sleep in clean sheets. How to use CHG prepackaged cloths Only use CHG cloths as told by your health care provider, and follow the instructions on the label. Use the CHG cloth on clean, dry skin. Do not use the CHG cloth on  your head or face unless your health care provider tells you to. When washing with the CHG cloth: Avoid your genital area. Avoid any areas of skin that have broken skin, cuts, or scrapes. Before surgery Follow these steps when using a CHG cloth to clean before surgery (unless your health care provider gives you different instructions): Using the CHG cloth, vigorously scrub the part of your body where you will be having surgery. Scrub using a back-and-forth motion for 3 minutes. The area on your body should be completely wet with CHG when you are done scrubbing. Do not rinse. Discard the cloth and let the area air-dry. Do not put any substances on the area afterward, such as powder, lotion, or perfume. Put on clean clothes or pajamas. If it is the night before your surgery, sleep in clean sheets.  For general bathing Follow these steps when using CHG cloths for general bathing (unless your health care provider gives you different instructions). Use a separate CHG cloth for each area of your body. Make sure you wash between any folds of skin and between your fingers and toes. Wash your body in the following order, switching to a new cloth after each step: The front of your neck, shoulders, and chest. Both of your arms, under your arms, and your hands. Your stomach and groin area, avoiding the genitals. Your right leg and foot.  Your left leg and foot. The back of your neck, your back, and your buttocks. Do not rinse. Discard the cloth and let the area air-dry. Do not put any substances on your body afterward--such as powder, lotion, or perfume--unless you are told to do so by your health care provider. Only use lotions that are recommended by the manufacturer. Put on clean clothes or pajamas. Contact a health care provider if: Your skin gets irritated after scrubbing. You have questions about using your solution or cloth. You swallow any chlorhexidine. Call your local poison control center  (1-617-291-2125 in the [place] Get help right away if: Your eyes itch badly, or they become very red or swollen. Your skin itches badly and is red or swollen. Your hearing changes. You have trouble seeing. You have swelling or tingling in your mouth or throat. You have trouble breathing. These symptoms may represent a serious problem that is an emergency. Do not wait to see if the symptoms will go away. Get medical help right away. Call your local emergency services (911 in the [place] Do not drive yourself to the hospital. Summary Chlorhexidine gluconate (CHG) is a germ-killing (antiseptic) solution that is used to clean the skin. Cleaning your skin with CHG may help to lower your risk for infection. You may be given CHG to use for bathing. It may be in a bottle or in a prepackaged cloth to use on your skin. Carefully follow your health care provider's instructions and the instructions on the product label. Do not use CHG if you have a chlorhexidine allergy. Contact your health care provider if your skin gets irritated after scrubbing. This information is not intended to replace advice given to you by your health care provider. Make sure you discuss any questions you have with your health care provider. Document Revised: 06/08/2020 Document Reviewed: 06/08/2020 Elsevier Patient Education  2022 [employer].

## 2020-12-29 ENCOUNTER — Other Ambulatory Visit: Payer: Self-pay

## 2020-12-29 ENCOUNTER — Encounter (HOSPITAL_COMMUNITY)
Admission: RE | Admit: 2020-12-29 | Discharge: 2020-12-29 | Disposition: A | Discharge status: Home / Self Care | Payer: Medicare HMO | Source: Ambulatory Visit | Attending: Orthopedic Surgery | Admitting: Orthopedic Surgery

## 2020-12-29 DIAGNOSIS — Z01818 Encounter for other preprocedural examination: Secondary | ICD-10-CM | POA: Insufficient documentation

## 2020-12-29 LAB — BASIC METABOLIC PANEL
Anion gap: 9 (ref 5–15)
BUN: 26 mg/dL — ABNORMAL HIGH (ref 8–23)
CO2: 24 mmol/L (ref 22–32)
Calcium: 9.8 mg/dL (ref 8.9–10.3)
Chloride: 103 mmol/L (ref 98–111)
Creatinine, Ser: 0.75 mg/dL (ref 0.44–1.00)
GFR, Estimated: >60 mL/min (ref >60)
Glucose, Bld: 97 mg/dL (ref 70–99)
Potassium: 3.4 mmol/L — ABNORMAL LOW (ref 3.5–5.1)
Sodium: 136 mmol/L (ref 135–145)

## 2020-12-29 LAB — CBC
HCT: 38 % (ref 36.0–46.0)
Hemoglobin: 12.8 g/dL (ref 12.0–15.0)
MCH: 26.3 pg (ref 26.0–34.0)
MCHC: 33.7 g/dL (ref 30.0–36.0)
MCV: 78 fL — ABNORMAL LOW (ref 80.0–100.0)
Platelets: 342 10*3/uL (ref 150–400)
RBC: 4.87 MIL/uL (ref 3.87–5.11)
RDW: 13.1 % (ref 11.5–15.5)
WBC: 10.1 10*3/uL (ref 4.0–10.5)
nRBC: 0 % (ref 0.0–0.2)

## 2020-12-31 ENCOUNTER — Ambulatory Visit (HOSPITAL_COMMUNITY): Payer: Worker's Compensation

## 2020-12-31 ENCOUNTER — Encounter (HOSPITAL_COMMUNITY): Payer: Self-pay | Admitting: Orthopedic Surgery

## 2020-12-31 ENCOUNTER — Encounter (HOSPITAL_COMMUNITY)
Admission: RE | Disposition: A | Discharge status: Home / Self Care | Payer: Self-pay | Source: Home / Self Care | Attending: Orthopedic Surgery

## 2020-12-31 ENCOUNTER — Ambulatory Visit (HOSPITAL_COMMUNITY): Payer: Worker's Compensation | Admitting: Certified Registered"

## 2020-12-31 ENCOUNTER — Ambulatory Visit (HOSPITAL_COMMUNITY)
Admission: RE | Admit: 2020-12-31 | Discharge: 2020-12-31 | Disposition: A | Discharge status: Home / Self Care | Payer: Worker's Compensation | Attending: Orthopedic Surgery | Admitting: Orthopedic Surgery

## 2020-12-31 DIAGNOSIS — S62101A Fracture of unspecified carpal bone, right wrist, initial encounter for closed fracture: Secondary | ICD-10-CM

## 2020-12-31 DIAGNOSIS — G8918 Other acute postprocedural pain: Secondary | ICD-10-CM | POA: Diagnosis not present

## 2020-12-31 DIAGNOSIS — S52571A Other intraarticular fracture of lower end of right radius, initial encounter for closed fracture: Secondary | ICD-10-CM | POA: Insufficient documentation

## 2020-12-31 DIAGNOSIS — W19XXXA Unspecified fall, initial encounter: Secondary | ICD-10-CM | POA: Insufficient documentation

## 2020-12-31 DIAGNOSIS — S52501D Unspecified fracture of the lower end of right radius, subsequent encounter for closed fracture with routine healing: Secondary | ICD-10-CM | POA: Diagnosis not present

## 2020-12-31 HISTORY — PX: OPEN REDUCTION INTERNAL FIXATION (ORIF) DISTAL RADIAL FRACTURE: SHX5989

## 2020-12-31 SURGERY — OPEN REDUCTION INTERNAL FIXATION (ORIF) DISTAL RADIUS FRACTURE
Anesthesia: General | Site: Wrist | Laterality: Right

## 2020-12-31 MED ORDER — DEXAMETHASONE SODIUM PHOSPHATE 10 MG/ML IJ SOLN
INTRAMUSCULAR | Status: AC
  Filled 2020-12-31: qty 1

## 2020-12-31 MED ORDER — ONDANSETRON HCL 4 MG/2ML IJ SOLN
INTRAMUSCULAR | Status: DC | PRN
Start: 2020-12-31 — End: 2020-12-31
  Administered 2020-12-31: 4 mg via INTRAVENOUS

## 2020-12-31 MED ORDER — FENTANYL CITRATE (PF) 100 MCG/2ML IJ SOLN
INTRAMUSCULAR | Status: AC
  Filled 2020-12-31: qty 2

## 2020-12-31 MED ORDER — ONDANSETRON HCL 4 MG/2ML IJ SOLN
INTRAMUSCULAR | Status: AC
  Filled 2020-12-31: qty 2

## 2020-12-31 MED ORDER — MIDAZOLAM HCL 2 MG/2ML IJ SOLN
INTRAMUSCULAR | Status: AC
  Filled 2020-12-31: qty 2

## 2020-12-31 MED ORDER — ROPIVACAINE HCL 5 MG/ML IJ SOLN
INTRAMUSCULAR | Status: AC
  Filled 2020-12-31: qty 30

## 2020-12-31 MED ORDER — DEXMEDETOMIDINE (PRECEDEX) IN NS 20 MCG/5ML (4 MCG/ML) IV SYRINGE
PREFILLED_SYRINGE | INTRAVENOUS | Status: DC | PRN
  Administered 2020-12-31: 20 ug via INTRAVENOUS

## 2020-12-31 MED ORDER — CELECOXIB 100 MG PO CAPS: 100.0000 mg | ORAL_CAPSULE | Freq: Every day | ORAL | 0 refills | Status: AC

## 2020-12-31 MED ORDER — BUPIVACAINE-EPINEPHRINE (PF) 0.5% -1:200000 IJ SOLN
INTRAMUSCULAR | Status: DC | PRN
  Administered 2020-12-31: 10 mL via PERINEURAL

## 2020-12-31 MED ORDER — ASPIRIN EC 81 MG PO TBEC: 81.0000 mg | DELAYED_RELEASE_TABLET | Freq: Two times a day (BID) | ORAL | 0 refills | Status: AC

## 2020-12-31 MED ORDER — BUPIVACAINE-EPINEPHRINE (PF) 0.5% -1:200000 IJ SOLN
INTRAMUSCULAR | Status: AC
  Filled 2020-12-31: qty 30

## 2020-12-31 MED ORDER — CEFAZOLIN SODIUM-DEXTROSE 2-4 GM/100ML-% IV SOLN
2.0000 g | INTRAVENOUS | Status: AC
  Administered 2020-12-31: 2 g via INTRAVENOUS

## 2020-12-31 MED ORDER — ROPIVACAINE HCL 5 MG/ML IJ SOLN
INTRAMUSCULAR | Status: DC | PRN
  Administered 2020-12-31 (×2): 15 mL via EPIDURAL

## 2020-12-31 MED ORDER — CEFAZOLIN SODIUM-DEXTROSE 2-4 GM/100ML-% IV SOLN
INTRAVENOUS | Status: AC
  Filled 2020-12-31: qty 100

## 2020-12-31 MED ORDER — FENTANYL CITRATE (PF) 100 MCG/2ML IJ SOLN
INTRAMUSCULAR | Status: DC | PRN
  Administered 2020-12-31 (×2): 50 ug via INTRAVENOUS

## 2020-12-31 MED ORDER — LIDOCAINE HCL (CARDIAC) PF 100 MG/5ML IV SOSY
PREFILLED_SYRINGE | INTRAVENOUS | Status: DC | PRN
  Administered 2020-12-31: 1 mL via INTRAVENOUS

## 2020-12-31 MED ORDER — PROPOFOL 10 MG/ML IV BOLUS
INTRAVENOUS | Status: DC | PRN
  Administered 2020-12-31: 200 mg via INTRAVENOUS

## 2020-12-31 MED ORDER — ONDANSETRON HCL 4 MG PO TABS: 4.0000 mg | ORAL_TABLET | Freq: Three times a day (TID) | ORAL | 0 refills | Status: AC | PRN

## 2020-12-31 MED ORDER — DEXAMETHASONE SODIUM PHOSPHATE 4 MG/ML IJ SOLN
INTRAMUSCULAR | Status: DC | PRN
  Administered 2020-12-31: 8 mg via INTRAVENOUS

## 2020-12-31 MED ORDER — FENTANYL CITRATE PF 50 MCG/ML IJ SOSY: 25.0000 ug | PREFILLED_SYRINGE | INTRAMUSCULAR | Status: DC | PRN

## 2020-12-31 MED ORDER — PROPOFOL 10 MG/ML IV BOLUS
INTRAVENOUS | Status: AC
  Filled 2020-12-31: qty 20

## 2020-12-31 MED ORDER — ONDANSETRON HCL 4 MG/2ML IJ SOLN: 4.0000 mg | Freq: Once | INTRAMUSCULAR | Status: DC | PRN

## 2020-12-31 MED ORDER — MIDAZOLAM HCL 5 MG/5ML IJ SOLN
INTRAMUSCULAR | Status: DC | PRN
  Administered 2020-12-31: 2 mg via INTRAVENOUS

## 2020-12-31 MED ORDER — DEXMEDETOMIDINE (PRECEDEX) IN NS 20 MCG/5ML (4 MCG/ML) IV SYRINGE
PREFILLED_SYRINGE | INTRAVENOUS | Status: AC
  Filled 2020-12-31: qty 10

## 2020-12-31 MED ORDER — CHLORHEXIDINE GLUCONATE 0.12 % MT SOLN
15.0000 mL | Freq: Once | OROMUCOSAL | Status: AC
  Administered 2020-12-31: 15 mL via OROMUCOSAL

## 2020-12-31 MED ORDER — CHLORHEXIDINE GLUCONATE 0.12 % MT SOLN
OROMUCOSAL | Status: AC
  Filled 2020-12-31: qty 15

## 2020-12-31 MED ORDER — OXYCODONE HCL 5 MG PO TABS: 5.0000 mg | ORAL_TABLET | ORAL | 0 refills | Status: AC | PRN

## 2020-12-31 MED ORDER — ORAL CARE MOUTH RINSE: 15.0000 mL | Freq: Once | OROMUCOSAL | Status: AC

## 2020-12-31 MED ORDER — LACTATED RINGERS IV SOLN: INTRAVENOUS | Status: DC

## 2020-12-31 MED ORDER — ACETAMINOPHEN 500 MG PO TABS: 1000.0000 mg | ORAL_TABLET | Freq: Three times a day (TID) | ORAL | 0 refills | Status: AC

## 2020-12-31 MED ORDER — LIDOCAINE HCL (PF) 2 % IJ SOLN
INTRAMUSCULAR | Status: AC
  Filled 2020-12-31: qty 5

## 2020-12-31 MED ORDER — SODIUM CHLORIDE 0.9 % IR SOLN
Status: DC | PRN
  Administered 2020-12-31: 1000 mL

## 2020-12-31 SURGICAL SUPPLY — 62 items
APL PRP STRL LF DISP 70% ISPRP (MISCELLANEOUS) ×1
BANDAGE ESMARK 4X12 BL STRL LF (DISPOSABLE) ×1 IMPLANT
BIT DRILL 1.7 (BIT) ×1 IMPLANT
BIT DRILL 2.5 CANN REUSE (DRILL) ×1 IMPLANT
BLADE SURG 15 STRL LF DISP TIS (BLADE) IMPLANT
BLADE SURG 15 STRL SS (BLADE) ×2
BNDG CMPR 12X4 ELC STRL LF (DISPOSABLE) ×1
BNDG CMPR STD VLCR NS LF 5.8X3 (GAUZE/BANDAGES/DRESSINGS) ×2
BNDG COHESIVE 4X5 TAN STRL (GAUZE/BANDAGES/DRESSINGS) ×2 IMPLANT
BNDG ELASTIC 3X5.8 VLCR NS LF (GAUZE/BANDAGES/DRESSINGS) ×4 IMPLANT
BNDG ESMARK 4X12 BLUE STRL LF (DISPOSABLE) ×2
CHLORAPREP W/TINT 26 (MISCELLANEOUS) ×2 IMPLANT
CLOTH BEACON ORANGE TIMEOUT ST (SAFETY) ×2 IMPLANT
COVER LIGHT HANDLE STERIS (MISCELLANEOUS) ×4 IMPLANT
CUFF TOURN SGL QUICK 18X4 (TOURNIQUET CUFF) ×2 IMPLANT
DRAPE C-ARM FOLDED MOBILE STRL (DRAPES) ×2 IMPLANT
DRAPE HALF SHEET 40X57 (DRAPES) ×1 IMPLANT
ELECT REM PT RETURN 9FT ADLT (ELECTROSURGICAL) ×2
ELECTRODE REM PT RTRN 9FT ADLT (ELECTROSURGICAL) ×1 IMPLANT
GAUZE SPONGE 4X4 12PLY STRL (GAUZE/BANDAGES/DRESSINGS) ×2 IMPLANT
GAUZE XEROFORM 1X8 LF (GAUZE/BANDAGES/DRESSINGS) ×2 IMPLANT
GLOVE SRG 8 PF TXTR STRL LF DI (GLOVE) ×1 IMPLANT
GLOVE SURG POLYISO LF SZ8 (GLOVE) ×6 IMPLANT
GLOVE SURG UNDER POLY LF SZ7 (GLOVE) ×6 IMPLANT
GLOVE SURG UNDER POLY LF SZ8 (GLOVE) ×2
GOWN STRL REUS W/ TWL XL LVL3 (GOWN DISPOSABLE) ×1 IMPLANT
GOWN STRL REUS W/TWL LRG LVL3 (GOWN DISPOSABLE) ×4 IMPLANT
GOWN STRL REUS W/TWL XL LVL3 (GOWN DISPOSABLE) ×2
GUIDEWIRE 1.35MM (WIRE) ×2 IMPLANT
K-WIRE BB-TAK (WIRE) ×2
KIT TURNOVER KIT A (KITS) ×2 IMPLANT
KWIRE BB-TAK (WIRE) IMPLANT
MANIFOLD NEPTUNE II (INSTRUMENTS) ×2 IMPLANT
NDL HYPO 18GX1.5 BLUNT FILL (NEEDLE) IMPLANT
NDL HYPO 21X1.5 SAFETY (NEEDLE) ×1 IMPLANT
NEEDLE HYPO 18GX1.5 BLUNT FILL (NEEDLE) ×2 IMPLANT
NEEDLE HYPO 21X1.5 SAFETY (NEEDLE) ×2 IMPLANT
NS IRRIG 1000ML POUR BTL (IV SOLUTION) ×2 IMPLANT
PACK BASIC LIMB (CUSTOM PROCEDURE TRAY) ×2 IMPLANT
PAD ARMBOARD 7.5X6 YLW CONV (MISCELLANEOUS) ×2 IMPLANT
PADDING CAST COTTON 6X4 STRL (CAST SUPPLIES) ×1 IMPLANT
PLATE NARROW RIGHT (Plate) ×1 IMPLANT
SCREW BONE LOCK 2.4 X 22 (Screw) ×1 IMPLANT
SCREW LO-PRO TI 3.5X16MM (Screw) ×1 IMPLANT
SCREW LOCK 16X2.4XSTVA (Screw) IMPLANT
SCREW LOCKING 2.4X16 (Screw) ×2 IMPLANT
SCREW NLOCK 14X3.5XLOPRFL NS (Screw) IMPLANT
SCREW NONLOCK 3.5X14 (Screw) ×4 IMPLANT
SCREW VAL TI 2.4X18 (Screw) ×1 IMPLANT
SCREW VAR TI 2.4X20 (Screw) ×2 IMPLANT
SET BASIN LINEN APH (SET/KITS/TRAYS/PACK) ×2 IMPLANT
SLEEVE DRILL 1.7 GUIDE (IRRIGATION / IRRIGATOR) ×6 IMPLANT
SPLINT IMMOBILIZER J 3INX20FT (CAST SUPPLIES) ×1
SPLINT J IMMOBILIZER 3X20FT (CAST SUPPLIES) ×1 IMPLANT
SPONGE T-LAP 18X18 ~~LOC~~+RFID (SPONGE) ×1 IMPLANT
SUT ETHILON 3 0 FSL (SUTURE) ×1 IMPLANT
SUT MON AB 2-0 SH 27 (SUTURE) ×2
SUT MON AB 2-0 SH27 (SUTURE) ×1 IMPLANT
SYR 30ML LL (SYRINGE) ×1 IMPLANT
SYR BULB IRRIG 60ML STRL (SYRINGE) ×2 IMPLANT
SYR CONTROL 10ML LL (SYRINGE) ×2 IMPLANT
WATER STERILE IRR 1000ML POUR (IV SOLUTION) ×2 IMPLANT

## 2020-12-31 NOTE — Anesthesia Procedure Notes (Signed)
Anesthesia Regional Block: Supraclavicular block   Pre-Anesthetic Checklist: , timeout performed,  Correct Patient, Correct Site, Correct Laterality,  Correct Procedure, Correct Position, site marked,  Risks and benefits discussed,  Surgical consent,  Pre-op evaluation,  At surgeon's request and post-op pain management  Laterality: Right  Prep: chloraprep       Needles:  Injection technique: Single-shot  Needle Type: Stimiplex     Needle Length: 10cm  Needle Gauge: 22     Additional Needles:   Procedures:,,,, ultrasound used (permanent image in chart),,    Narrative:  Start time: 12/31/2020 7:11 AM End time: 12/31/2020 7:15 AM Injection made incrementally with aspirations every 5 mL.  Performed by: With CRNAs  Anesthesiologist: Louann Sjogren, MD CRNA: Tacy Learn, CRNA

## 2020-12-31 NOTE — Anesthesia Procedure Notes (Signed)
Procedure Name: LMA Insertion Date/Time: 12/31/2020 7:31 AM Performed by: Tacy Learn, CRNA Pre-anesthesia Checklist: Patient identified, Emergency Drugs available, Suction available, Patient being monitored and Timeout performed Patient Re-evaluated:Patient Re-evaluated prior to induction Oxygen Delivery Method: Circle system utilized Preoxygenation: Pre-oxygenation with 100% oxygen Induction Type: IV induction LMA: LMA inserted LMA Size: 4.0 Number of attempts: 1 Placement Confirmation: positive ETCO2, CO2 detector and breath sounds checked- equal and bilateral Tube secured with: Tape Dental Injury: Teeth and Oropharynx as per pre-operative assessment

## 2020-12-31 NOTE — Anesthesia Postprocedure Evaluation (Signed)
Anesthesia Post Note  Patient: Teresa Arellano  Procedure(s) Performed: OPEN REDUCTION INTERNAL FIXATION (ORIF) DISTAL RADIAL FRACTURE (Right: Wrist)  Patient location during evaluation: Phase II Anesthesia Type: General Level of consciousness: awake Pain management: pain level controlled Vital Signs Assessment: post-procedure vital signs reviewed and stable Respiratory status: spontaneous breathing and respiratory function stable Cardiovascular status: blood pressure returned to baseline and stable Postop Assessment: no headache and no apparent nausea or vomiting Anesthetic complications: no Comments: Late entry   No notable events documented.   Last Vitals:  Vitals:   12/31/20 0942 12/31/20 1021  BP: 132/73 118/84  Pulse:  92  Resp: 18 16  Temp: 36.6 C 36.9 C  SpO2: 92% 100%    Last Pain:  Vitals:   12/31/20 1021  TempSrc: Oral  PainSc:                  Louann Sjogren

## 2020-12-31 NOTE — Discharge Instructions (Signed)
Teresa Arellano A. Teresa Kinsman, Teresa Arellano [place] [address] [place],  [place]  [address] Phone: [phone] Fax: [phone]   [place] Please keep splint clean dry and intact until followup.  You may shower on Post-Op Day #2.  You must keep splint dry during this process and may find that a plastic bag taped around the arm or alternatively a towel based bath may be a better option.   If you get your splint wet or if it is damaged please contact our clinic.  EXERCISES Due to your splint being in place you will not be able to bear weight through your extremity.   DO NOT PUT ANY WEIGHT ON YOUR OPERATIVE ARM It is ok to work on gentle range of motion, moving your elbow and exposed fingers   REGIONAL ANESTHESIA ([place]) The anesthesia team may have performed a nerve block for you if safe in the setting of your care.  This is a great tool used to minimize pain.  Typically the block may start wearing off overnight but the long acting medicine may last for 3-4 days.  The nerve block wearing off can be a challenging period but please utilize your as needed pain medications to try and manage this period.    POST-OP MEDICATIONS- Multimodal approach to pain control  In general your pain will be controlled with a combination of substances.  Prescriptions unless otherwise discussed are electronically sent to your pharmacy.  This is a carefully made plan we use to minimize narcotic use.     - Celebrex - Anti-inflammatory medication taken on a scheduled basis  - Acetaminophen - Non-narcotic pain medicine taken on a scheduled basis   - Oxycodone - This is a strong narcotic, to be used only on an "as needed" basis for pain.  -  Aspirin 81mg  - This medicine is used to minimize the risk of blood clots after surgery.             -          Zofran - take as needed for nausea   FOLLOW-UP If you develop a Fever (>101.5), Redness  or Drainage from the surgical incision site, please call our office to arrange for an evaluation. Please call the office to schedule a follow-up appointment for your incision check if you do not already have one, 10-14 days post-operatively.  IF YOU HAVE ANY QUESTIONS, PLEASE FEEL FREE TO CALL OUR OFFICE.  HELPFUL INFORMATION  If you had a block, it will wear off between 8-24 hrs postop typically.  This is period when your pain may go from nearly zero to the pain you would have had postop without the block.  This is an abrupt transition but nothing dangerous is happening.  You may take an extra dose of narcotic when this happens.  You should wean off your narcotic medicines as soon as you are able.  Most patients will be off or using minimal narcotics before their first postop appointment.   Elevating your hand will help with swelling and pain control.  You are encouraged to elevate your hand as much as possible in the first couple of weeks following surgery.  Imagine a drop of water on your finger, and your goal is to get that water back to your heart.  We suggest you use the pain medication the first night prior to going to bed, in order to ease any pain when  the anesthesia wears off. You should avoid taking pain medications on an empty stomach as it will make you nauseous.  Do not drink alcoholic beverages or take illicit drugs when taking pain medications.  In most states it is against the law to drive while you are in a splint or sling.  And certainly against the law to drive while taking narcotics.  You may return to work/school in the next couple of days when you feel up to it.   Pain medication may make you constipated.  Below are a few solutions to try in this order: Decrease the amount of pain medication if you aren't having pain. Drink lots of decaffeinated fluids. Drink prune juice and/or each dried prunes  If the first 3 don't work start with additional solutions Take Colace -  an over-the-counter stool softener Take Senokot - an over-the-counter laxative Take Miralax - a stronger over-the-counter laxative

## 2020-12-31 NOTE — Transfer of Care (Signed)
Immediate Anesthesia Transfer of Care Note  Patient: Teresa Arellano  Procedure(s) Performed: OPEN REDUCTION INTERNAL FIXATION (ORIF) DISTAL RADIAL FRACTURE (Right: Wrist)  Patient Location: PACU  Anesthesia Type:General  Level of Consciousness: awake, alert , oriented and patient cooperative  Airway & Oxygen Therapy: Patient Spontanous Breathing  Post-op Assessment: Report given to RN, Post -op Vital signs reviewed and stable and Patient moving all extremities X 4  Post vital signs: Reviewed and stable  Last Vitals:  Vitals Value Taken Time  BP    Temp    Pulse    Resp    SpO2      Last Pain:  Vitals:   12/31/20 0645  PainSc: 0-No pain         Complications: No notable events documented.

## 2020-12-31 NOTE — Interval H&P Note (Signed)
History and Physical Interval Note:  12/31/2020 7:10 AM  Teresa Arellano  has presented today for surgery, with the diagnosis of Right Distal Radius Fracture.  The various methods of treatment have been discussed with the patient and family. After consideration of risks, benefits and other options for treatment, the patient has consented to  Procedure(s) with comments: OPEN REDUCTION INTERNAL FIXATION (ORIF) DISTAL RADIAL FRACTURE (Right) - ORIF Right Distal Radius Fracture as a surgical intervention.  The patient's history has been reviewed, patient examined, no change in status, stable for surgery.  I have reviewed the patient's chart and labs.  Questions were answered to the patient's satisfaction.    Patient ready for surgery.  All questions answered.  Consent finalized.  Pain is controlled.  No numbness or tingling in the median nerve distribution.    Mordecai Rasmussen

## 2020-12-31 NOTE — Op Note (Signed)
Orthopaedic Surgery Operative Note (CSN: [id number])  Teresa Arellano  [birth year] Date of Surgery: 12/31/2020   Diagnoses:  Right Distal Radius Fracture  Procedure: Operative fixation of right distal radius fracture.   Operative Finding Successful completion of the planned procedure.  Fracture was comminuted and there was an intra-articular split.  Overall bone quality was poor.  We were able to reduce the fracture and improve the overall alignment.  She was placed in a sugar tong splint postop, and a standard 3 point mold was utilized.   Post-Op Diagnosis: Same Surgeons:Primary: Mordecai Rasmussen, MD Location: [facility] OR ROOM 4 Anesthesia: General with regional anesthesia Antibiotics: Ancef 2 g Tourniquet time:  Total Tourniquet Time Documented: Upper Arm (Right) - 87 minutes Total: Upper Arm (Right) - 87 minutes Estimated Blood Loss: 10 cc Complications: None Specimens: None Implants: Implant Name Type Inv. Item Serial No. Manufacturer Lot No. LRB No. Used Action  SCREW VAL TI 2.4X18 - [id number] Screw SCREW VAL TI 2.4X18  [employer] STERILE ON SET Right 1 Implanted  2.4 X 22 LOCKING SCREW    [employer] STERILE ON SET Right 1 Implanted  SCREW VAR TI 2.4X20 - [id number] Screw SCREW VAR TI 2.4X20  [employer] STERILE ON SET Right 2 Implanted  SCREW LOCKING 2.4X16 - [id number] Screw SCREW LOCKING 2.4X16  [employer] STERILE ON SET Right 1 Implanted  PLATE NARROW RIGHT - [id number] Plate PLATE NARROW RIGHT  [employer] STERILE ON SET Right 1 Implanted  SCREW LO-PRO TI 3.5X16MM - [id number] Screw SCREW LO-PRO TI 3.5X16MM  [employer] STERILE ON SET Right 1 Implanted  SCREW NONLOCK 3.5X14 - [id number] Screw SCREW NONLOCK 3.5X14  [employer] STERILE ON SET Right 1 Implanted    Indications for Surgery:   Teresa Arellano is a 70 y.o. female who sustained a right distal radius fracture after a fall.  The fracture had an intra-articular split with dorsal angulation of the joint line, beyond  acceptable parameters.  In order to restore form and function, I recommended operative fixation.  Benefits and risks of operative and nonoperative management were discussed prior to surgery with patient/guardian(s) and informed consent form was completed.  Specific risks including infection, need for additional surgery, malunion, nonunion, persistent pain, damage to surrounding structures including the radial artery, median nerve and flexor and extensor tendons as well as more severe complications associated with anesthesia.  She elected to proceed. Surgical consent was finalized     Procedure:   The patient was identified properly. Informed consent was obtained and the surgical site was marked. The patient was taken to the OR where general anesthesia was induced.  The patient was positioned supine on a hand table.  The right arm was prepped and draped in the usual sterile fashion.  Timeout was performed before the beginning of the case.  Tourniquet was used for the above duration.  Patient received 2 g of Ancef prior to making incision.  We made an incision directly overlying the FCR tendon.  We proceeded with a standard volar approach to expose the volar aspect of the distal radius.  We carefully protected the radial artery on the radial forearm and the median nerve in the ulnar aspect of the incision.  With the assistance of fluoroscopy, the fracture site was identified.  Some callus had formed.  This was debrided with a rongeur and irrigation.  At this point we noted that there was an intra-articular split.  The radial styloid was a separate fragment.  In addition, her bone quality was poor.  The fracture was reduced, and we placed a K-wire in the radial styloid to provide provisional fixation.  We then selected the above stated plate from the back table, and placed this over the volar cortex.  The fracture was then further reduced and the plate was provisionally secured with a K-wire and reduction  clamp.  At this point, we were satisfied with the reduction and the position of the plate.  We then placed multiple locking screws in the distal fragment.  Overall, the fracture was very distal, but we were able to capture the distal fragment without the plate being too distal.  Next, we placed a single bicortical screw in the shaft to secure the plate proximally.  We achieved good fixation with this screw.  We then proceeded to place 2 screws within the radial styloid to secure this fracture fragment.  We then removed the K-wires and the reduction clamps.  Fluoroscopy confirmed the placement of the screws with an acceptable overall alignment.  We then placed additional locking screws distally, and another bicortical screw in the shaft.  All retractors and K-wires were removed.  Repeat fluoroscopic imaging confirmed the placement of the plate and screws.  We confirmed that there were no screws within the joint.    We irrigated the wound copiously. We placed a couple of sutures to repair the pronator quadratus over the plate and the skin was closed with 3-0 nylon.  Sterile dressing was placed followed by a well padded splint.  We placed a 3-point mold on the splint, and confirmed the overall reduction with final fluoroscopic images. Patient was awoken taken to PACU in stable condition.   Post-operative plan:  The patient will be NWB on the right upper extremity. She will discharged from the PACU once she is stable.   DVT prophylaxis Aspirin 81 mg twice daily for 6 weeks, or until she is mobile.  Pain control with PRN pain medication preferring oral medicines.   Follow up plan will be scheduled in approximately 10-14 days for incision check and XR.

## 2020-12-31 NOTE — Anesthesia Preprocedure Evaluation (Signed)
Anesthesia Evaluation  Patient identified by MRN, date of birth, ID band Patient awake    Reviewed: Allergy & Precautions, H&P , NPO status , Patient's Chart, lab work & pertinent test results, reviewed documented beta blocker date and time   Airway Mallampati: II  TM Distance: >3 FB Neck ROM: full    Dental no notable dental hx. (+) Teeth Intact   Pulmonary neg pulmonary ROS,    Pulmonary exam normal breath sounds clear to auscultation       Cardiovascular Exercise Tolerance: Good hypertension, negative cardio ROS   Rhythm:regular Rate:Normal     Neuro/Psych negative neurological ROS  negative psych ROS   GI/Hepatic negative GI ROS, Neg liver ROS,   Endo/Other  negative endocrine ROS  Renal/GU negative Renal ROS  negative genitourinary   Musculoskeletal   Abdominal   Peds  Hematology negative hematology ROS (+)   Anesthesia Other Findings   Reproductive/Obstetrics negative OB ROS                             Anesthesia Physical Anesthesia Plan  ASA: 2  Anesthesia Plan: General   Post-op Pain Management:  Regional for Post-op pain and GA combined w/ Regional for post-op pain   Induction:   PONV Risk Score and Plan: Ondansetron  Airway Management Planned:   Additional Equipment:   Intra-op Plan:   Post-operative Plan:   Informed Consent: I have reviewed the patients History and Physical, chart, labs and discussed the procedure including the risks, benefits and alternatives for the proposed anesthesia with the patient or authorized representative who has indicated his/her understanding and acceptance.     Dental Advisory Given  Plan Discussed with: CRNA  Anesthesia Plan Comments:         Anesthesia Quick Evaluation

## 2021-01-01 NOTE — Progress Notes (Signed)
Pt instructed on incentive spirometer. 2000 ml obtained. Tolerated well.

## 2021-01-05 ENCOUNTER — Encounter (HOSPITAL_COMMUNITY): Payer: Self-pay | Admitting: Orthopedic Surgery

## 2021-01-12 ENCOUNTER — Encounter: Payer: Self-pay | Admitting: Orthopedic Surgery

## 2021-01-12 ENCOUNTER — Ambulatory Visit (INDEPENDENT_AMBULATORY_CARE_PROVIDER_SITE_OTHER): Payer: Medicare HMO | Admitting: Orthopedic Surgery

## 2021-01-12 ENCOUNTER — Ambulatory Visit: Payer: Medicare HMO

## 2021-01-12 ENCOUNTER — Other Ambulatory Visit: Payer: Self-pay

## 2021-01-12 DIAGNOSIS — S52531D Colles' fracture of right radius, subsequent encounter for closed fracture with routine healing: Secondary | ICD-10-CM

## 2021-01-12 NOTE — Patient Instructions (Signed)

## 2021-01-12 NOTE — Progress Notes (Signed)
Orthopaedic Postop Note  Assessment: Teresa Arellano is a 70 y.o. female s/p ORIF of a right distal radius fracture  DOS: 12/31/20  Plan: Stitches were removed in clinic today, and Steri-Strips were placed Radiographs demonstrate stable alignment, without hardware failure. She does not require any additional pain medications. She was placed in a short arm cast, and we will plan to continue this for an additional 2-4 weeks. She will continue to take aspirin daily, until she is back to her usual level of activities.  Cast application - right short arm cast   Verbal consent was obtained and the correct extremity was identified. A well padded, appropriately molded short arm cast was applied to the right arm Fingers remained warm and well perfused.   There were no sharp edges Patient tolerated the procedure well Cast care instructions were provided    Follow-up: Return in about 2 weeks (around 01/26/2021). XR at next visit: Right wrist  Subjective:  Chief Complaint  Patient presents with   Wrist Injury    Fractured right radius//dos 12/31/20//xrays out of splint    History of Present Illness: Teresa Arellano is a 70 y.o. female who presents following the above stated procedure.  Surgery was approximately 2 weeks ago.  She denies any issues with pain.  She tolerated the splint well.  She does not have any numbness or tingling.  She is not taking any narcotic pain medications at this time.  Review of Systems: No fevers or chills No numbness or tingling No Chest Pain No shortness of breath   Objective: There were no vitals taken for this visit.  Physical Exam:  Alert and oriented.  No acute distress.  Evaluation of the right wrist demonstrates minimal swelling.  No bruising is appreciated.  Surgical incision is healing well, without surrounding erythema or drainage.  Her fingers are stiff, but she does have active motion in the AIN/PIN/U nerve distribution.  Sensation is  intact in the superficial radial, median and ulnar nerve distribution.  2+ radial pulse.  Fingers are warm and well-perfused.  IMAGING: I personally ordered and reviewed the following images:  X-ray of the right wrist was obtained in clinic today and demonstrates maintenance of alignment of the right distal radius fracture.  There is no evidence of hardware failure or loosening.  There is some interval consolidation at the dorsal fracture line, as well as the intra-articular split.  Impression: Healing right distal radius fracture, without hardware failure following operative fixation   Mordecai Rasmussen, MD 01/12/2021 11:20 PM

## 2021-01-26 ENCOUNTER — Ambulatory Visit (INDEPENDENT_AMBULATORY_CARE_PROVIDER_SITE_OTHER): Payer: Medicare HMO | Admitting: Orthopedic Surgery

## 2021-01-26 ENCOUNTER — Other Ambulatory Visit: Payer: Self-pay

## 2021-01-26 ENCOUNTER — Ambulatory Visit: Payer: Medicare HMO

## 2021-01-26 ENCOUNTER — Encounter: Payer: Self-pay | Admitting: Orthopedic Surgery

## 2021-01-26 DIAGNOSIS — S52531D Colles' fracture of right radius, subsequent encounter for closed fracture with routine healing: Secondary | ICD-10-CM

## 2021-01-26 NOTE — Patient Instructions (Signed)

## 2021-01-26 NOTE — Progress Notes (Signed)
Orthopaedic Postop Note  Assessment: Teresa Arellano is a 70 y.o. female s/p ORIF of a right distal radius fracture  DOS: 12/31/20  Plan: Patient's pain continues to improve.  She tolerated the cast well.  Range of motion of her fingers is improving.  Radiographs were reviewed in clinic today, and there may be slight subsidence of the fracture, which is resulted in some prominence of the plate.  However, the views obtained in clinic today are not the same as previous x-rays.  This may put her at increased risk for some flexor tendon irritation.  As such, we may have to be more aggressive with removal of the plate.  For now, we will continue with immobilization in 1 more cast.  After that, we will advance her range of motion and weightbearing.  In the meantime, she should also work on improving range of motion of her fingers, which is good at this point, but is not yet full.  She stated her understanding.  Follow-up in 2 weeks for cast removal and transition to a removable wrist splint.  Cast application - right short arm cast   Verbal consent was obtained and the correct extremity was identified. A well padded, appropriately molded short arm cast was applied to the right arm Fingers remained warm and well perfused.   There were no sharp edges Patient tolerated the procedure well Cast care instructions were provided    Follow-up: Return in about 2 weeks (around 02/09/2021). XR at next visit: Right wrist  Subjective:  Chief Complaint  Patient presents with   FX CARE    R wrist/ xray today out of cast/ DOS 12/31/20    History of Present Illness: Teresa Arellano is a 70 y.o. female who presents following the above stated procedure.  Surgery was approximately 1 month ago.  She has done well.  She tolerated the cast.  She denies numbness and tingling.  She is not taking pain medications.  She states that her wrist feels better.  Review of Systems: No fevers or chills No numbness or  tingling No Chest Pain No shortness of breath   Objective: There were no vitals taken for this visit.  Physical Exam:  Alert and oriented.  No acute distress.  Evaluation of the right wrist demonstrates no swelling or bruising.  Sensation is intact in the superficial radial, median and ulnar nerve distributions.  2+ radial pulse.  Surgical incision is healing well, with some residual dry skin and scabbing.  She has near full range of motion of all of her fingers.  She is not quite able to make a full fist.  IMAGING: I personally ordered and reviewed the following images:  X-rays of the right wrist were obtained in clinic today and demonstrates maintenance of alignment.  No evidence of hardware failure or loosening.  There may be mild subsidence of the fracture, which may have made the volar plate slightly more prominent.  Otherwise, no adverse features.  Evidence of callus formation, particularly in the dorsal aspect of the wrist.  Impression: Healing right distal radius fracture, without hardware failure or loosening.   Mordecai Rasmussen, MD 01/26/2021 12:44 PM

## 2021-02-04 ENCOUNTER — Encounter: Payer: Medicare HMO | Admitting: Family Medicine

## 2021-02-08 ENCOUNTER — Other Ambulatory Visit: Payer: Self-pay

## 2021-02-08 ENCOUNTER — Ambulatory Visit (INDEPENDENT_AMBULATORY_CARE_PROVIDER_SITE_OTHER): Payer: Medicare HMO

## 2021-02-08 DIAGNOSIS — Z0000 Encounter for general adult medical examination without abnormal findings: Secondary | ICD-10-CM | POA: Diagnosis not present

## 2021-02-08 NOTE — Progress Notes (Signed)
Subjective:   Teresa Arellano is a 70 y.o. female who presents for Medicare Annual (Subsequent) preventive examination.  I connected with  [name] on 02/08/21 by a audio enabled telemedicine application and verified that I am speaking with the correct person using two identifiers.   I discussed the limitations, risks, security and privacy concerns of performing an evaluation and management service by telephone and the availability of in person appointments. I also discussed with the patient that there may be a patient responsible charge related to this service. The patient expressed understanding and verbally consented to this telephonic visit.   Review of Systems           Objective:    There were no vitals filed for this visit. There is no height or weight on file to calculate BMI.  Advanced Directives 12/31/2020 12/29/2020 12/16/2020 02/03/2020 12/25/2017 08/24/2016 05/23/2016  Does Patient Have a Medical Advance Directive? Yes No No No No No No  Would patient like information on creating a medical advance directive? No - Patient declined No - Patient declined - No - Patient declined Yes (ED - Information included in AVS) - No - Patient declined    Current Medications (verified) Outpatient Encounter Medications as of 02/08/2021  Medication Sig   alendronate (FOSAMAX) 70 MG tablet Take 1 tablet (70 mg total) by mouth every 7 (seven) days. Take with a full glass of water on an empty stomach. (Patient taking differently: Take 70 mg by mouth every Monday. Take with a full glass of water on an empty stomach.)   aspirin EC 81 MG tablet Take 1 tablet (81 mg total) by mouth in the morning and at bedtime. Swallow whole.   CALCIUM PO Take 1 tablet by mouth daily after breakfast.   Cholecalciferol (VITAMIN D3) 50 MCG (2000 UT) TABS Take 2,000 Units by mouth in the morning.   potassium chloride (KLOR-CON) 10 MEQ tablet Take 1 tablet (10 mEq total) by mouth daily.    triamterene-hydrochlorothiazide (MAXZIDE-25) 37.5-25 MG tablet Take 1 tablet by mouth once daily   No facility-administered encounter medications on file as of 02/08/2021.    Allergies (verified) Patient has no known allergies.   History: Past Medical History:  Diagnosis Date   Cataract    Hypertension 2014   Past Surgical History:  Procedure Laterality Date   ABDOMINAL HYSTERECTOMY  1982   partial, fibroids   CATARACT EXTRACTION W/PHACO Right 05/02/2016   Procedure: CATARACT EXTRACTION PHACO AND INTRAOCULAR LENS PLACEMENT ([facility]);  Surgeon: Tonny Branch, MD;  Location: [facility];  Service: Ophthalmology;  Laterality: Right;  CDE: 5.35   CATARACT EXTRACTION W/PHACO Left 05/23/2016   Procedure: CATARACT EXTRACTION PHACO AND INTRAOCULAR LENS PLACEMENT LEFT EYE;  Surgeon: Tonny Branch, MD;  Location: [facility];  Service: Ophthalmology;  Laterality: Left;  CDE:  5.45   CHOLECYSTECTOMY  1991 approx   COLONOSCOPY N/A 04/06/2016   Procedure: COLONOSCOPY;  Surgeon: Danie Binder, MD;  Location: [facility];  Service: Endoscopy;  Laterality: N/A;  10:30 Am   OPEN REDUCTION INTERNAL FIXATION (ORIF) DISTAL RADIAL FRACTURE Right 12/31/2020   Procedure: OPEN REDUCTION INTERNAL FIXATION (ORIF) DISTAL RADIAL FRACTURE;  Surgeon: Mordecai Rasmussen, MD;  Location: [facility];  Service: Orthopedics;  Laterality: Right;   POLYPECTOMY  04/06/2016   Procedure: POLYPECTOMY;  Surgeon: Danie Binder, MD;  Location: [facility];  Service: Endoscopy;;  hepatic flexure polypectomy;   TUBAL LIGATION  1979   Family History  Problem Relation  Age of Onset   Heart disease Mother    Diabetes Father    Heart disease Brother    Cancer Sister 37       breast    Cancer Sister        stomach    Heart disease Brother    Hypertension Sister    Hypertension Sister    Diabetes Sister    Diabetes Sister    Social History   Socioeconomic History   Marital status: Married    Spouse name: Not on file   Number of children: Not  on file   Years of education: college    Highest education level: Associate degree: academic program  Occupational History   Not on file  Tobacco Use   Smoking status: Never   Smokeless tobacco: Never  Substance and Sexual Activity   Alcohol use: No   Drug use: No   Sexual activity: Yes    Birth control/protection: Surgical  Other Topics Concern   Not on file  Social History Narrative   Not on file   Social Determinants of Health   Financial Resource Strain: Not on file  Food Insecurity: Not on file  Transportation Needs: Not on file  Physical Activity: Not on file  Stress: Not on file  Social Connections: Not on file    Tobacco Counseling Counseling given: Not Answered   Clinical Intake:              How often do you need to have someone help you when you read instructions, pamphlets, or other written materials from your doctor or pharmacy?: (P) 1 - Never  Diabetic? No         Activities of Daily Living In your present state of health, do you have any difficulty performing the following activities: 02/07/2021 12/29/2020  Hearing? N N  Vision? N N  Difficulty concentrating or making decisions? N N  Walking or climbing stairs? N N  Dressing or bathing? N N  Doing errands, shopping? N N  Preparing Food and eating ? N -  Using the Toilet? N -  In the past six months, have you accidently leaked urine? N -  Managing your Medications? N -  Managing your Finances? N -  Housekeeping or managing your Housekeeping? N -  Some recent data might be hidden    Patient Care Team: Fayrene Helper, MD as PCP - General (Family Medicine)  Indicate any recent Medical Services you may have received from other than [facility] providers in the past year (date may be approximate).     Assessment:   This is a routine wellness examination for Teresa Arellano.  Hearing/Vision screen No results found.  Dietary issues and exercise activities discussed:     Goals Addressed    None   Depression Screen PHQ 2/9 Scores 04/27/2020 02/03/2020 02/03/2020 11/25/2019 10/23/2019 03/11/2019 12/27/2018  PHQ - 2 Score 0 0 0 0 0 0 0  PHQ- 9 Score - - - - - - -    Fall Risk Fall Risk  02/07/2021 10/30/2020 04/27/2020 02/03/2020 11/25/2019  Falls in the past year? 1 0 0 0 0  Number falls in past yr: 0 0 0 0 0  Injury with Fall? 1 0 0 0 0  Risk for fall due to : - - - No Fall Risks -  Follow up - - - Falls evaluation completed Falls evaluation completed    FALL RISK PREVENTION PERTAINING TO THE HOME:  Any stairs in or  around the home? Yes  If so, are there any without handrails? Yes  Home free of loose throw rugs in walkways, pet beds, electrical cords, etc? No  Adequate lighting in your home to reduce risk of falls? Yes   ASSISTIVE DEVICES UTILIZED TO PREVENT FALLS:  Life alert? No  Use of a cane, walker or w/c? No  Grab bars in the bathroom? No  Shower chair or bench in shower? No  Elevated toilet seat or a handicapped toilet? No   TIMED UP AND GO:  Was the test performed? No .  Length of time to ambulate 10 feet: n/a sec.     Cognitive Function:     6CIT Screen 02/03/2020 12/27/2018 12/25/2017  What Year? 0 points 0 points 0 points  What month? 0 points 0 points 0 points  What time? 0 points 0 points 0 points  Count back from 20 0 points 0 points -  Months in reverse 0 points 0 points -  Repeat phrase 0 points 0 points -  Total Score 0 0 -    Immunizations Immunization History  Administered Date(s) Administered   Fluad Quad(high Dose 65+) 12/24/2018, 02/18/2020   Influenza,inj,Quad PF,6+ Mos 01/09/2014, 11/26/2015, 05/18/2017, 12/29/2017   PFIZER(Purple Top)SARS-COV-2 Vaccination 05/02/2019, 05/23/2019, 03/27/2020   Pneumococcal Conjugate-13 05/17/2016   Pneumococcal Polysaccharide-23 05/18/2017   Tdap 09/10/2013   Zoster, Live 10/20/2014    TDAP status: Up to date  Flu Vaccine status: Due, Education has been provided regarding the importance  of this vaccine. Advised may receive this vaccine at local pharmacy or Health Dept. Aware to provide a copy of the vaccination record if obtained from local pharmacy or Health Dept. Verbalized acceptance and understanding.  Pneumococcal vaccine status: Due, Education has been provided regarding the importance of this vaccine. Advised may receive this vaccine at local pharmacy or Health Dept. Aware to provide a copy of the vaccination record if obtained from local pharmacy or Health Dept. Verbalized acceptance and understanding.  Covid-19 vaccine status: Completed vaccines  Qualifies for Shingles Vaccine? Yes   Zostavax completed No   Shingrix Completed?: No.    Education has been provided regarding the importance of this vaccine. Patient has been advised to call insurance company to determine out of pocket expense if they have not yet received this vaccine. Advised may also receive vaccine at local pharmacy or Health Dept. Verbalized acceptance and understanding.  Screening Tests Health Maintenance  Topic Date Due   Zoster Vaccines- Shingrix (1 of 2) Never done   COVID-19 Vaccine (4 - Booster for Pfizer series) 05/22/2020   INFLUENZA VACCINE  11/09/2020   MAMMOGRAM  02/16/2022   TETANUS/TDAP  09/11/2023   COLONOSCOPY (Pts 45-42yrs Insurance coverage will need to be confirmed)  04/06/2026   Pneumonia Vaccine 38+ Years old  Completed   DEXA SCAN  Completed   Hepatitis C Screening  Completed   HPV VACCINES  Aged Out    Health Maintenance  Health Maintenance Due  Topic Date Due   Zoster Vaccines- Shingrix (1 of 2) Never done   COVID-19 Vaccine (4 - Booster for Pfizer series) 05/22/2020   INFLUENZA VACCINE  11/09/2020    Colorectal cancer screening: Referral to GI placed  . Pt aware the office will call re: appt.  Mammogram status: Completed 02/17/2020. Repeat every year  Bone Density status: Completed 11/05/2019. Results reflect: Bone density results: OSTEOPENIA. Repeat every 5  years.  Lung Cancer Screening: (Low Dose CT Chest recommended if Age 86-80 years, 50  pack-year currently smoking OR have quit w/in 15years.) does not qualify.   Lung Cancer Screening Referral: n/a  Additional Screening:  Hepatitis C Screening: does qualify; Completed 11/26/2015   Vision Screening: Recommended annual ophthalmology exams for early detection of glaucoma and other disorders of the eye. Is the patient up to date with their annual eye exam?  Yes  Who is the provider or what is the name of the office in which the patient attends annual eye exams? My eye doctor Linna Hoff  If pt is not established with a provider, would they like to be referred to a provider to establish care? No .   Dental Screening: Recommended annual dental exams for proper oral hygiene  Community Resource Referral / Chronic Care Management: CRR required this visit?  No   CCM required this visit?  No      Plan:     I have personally reviewed and noted the following in the patient's chart:   Medical and social history Use of alcohol, tobacco or illicit drugs  Current medications and supplements including opioid prescriptions.  Functional ability and status Nutritional status Physical activity Advanced directives List of other physicians Hospitalizations, surgeries, and ER visits in previous 12 months Vitals Screenings to include cognitive, depression, and falls Referrals and appointments  In addition, I have reviewed and discussed with patient certain preventive protocols, quality metrics, and best practice recommendations. A written personalized care plan for preventive services as well as general preventive health recommendations were provided to patient.     Quentin Angst, [place]   02/08/2021   Nurse Notes: this was a tele health visit. The patient was at home and the provider was in the office rutwik patel, md.

## 2021-02-08 NOTE — Patient Instructions (Signed)
Teresa Arellano , Thank you for taking time to come for your Medicare Wellness Visit. I appreciate your ongoing commitment to your health goals. Please review the following plan we discussed and let me know if I can assist you in the future.   Screening recommendations/referrals: Colonoscopy: not due Mammogram: due now Bone Density: not due Recommended yearly ophthalmology/optometry visit for glaucoma screening and checkup Recommended yearly dental visit for hygiene and checkup  Vaccinations: Influenza vaccine: due now  Pneumococcal vaccine: due now  Tdap vaccine: not due  Shingles vaccine: due now     Advanced directives: patient declined at this time   Conditions/risks identified: hypertension  Next appointment: 1 year    Preventive Care 16 Years and Older, Female Preventive care refers to lifestyle choices and visits with your health care provider that can promote health and wellness. What does preventive care include? A yearly physical exam. This is also called an annual well check. Dental exams once or twice a year. Routine eye exams. Ask your health care provider how often you should have your eyes checked. Personal lifestyle choices, including: Daily care of your teeth and gums. Regular physical activity. Eating a healthy diet. Avoiding tobacco and drug use. Limiting alcohol use. Practicing safe sex. Taking low-dose aspirin every day. Taking vitamin and mineral supplements as recommended by your health care provider. What happens during an annual well check? The services and screenings done by your health care provider during your annual well check will depend on your age, overall health, lifestyle risk factors, and family history of disease. Counseling  Your health care provider may ask you questions about your: Alcohol use. Tobacco use. Drug use. Emotional well-being. Home and relationship well-being. Sexual activity. Eating habits. History of falls. Memory and  ability to understand (cognition). Work and work Statistician. Reproductive health. Screening  You may have the following tests or measurements: Height, weight, and BMI. Blood pressure. Lipid and cholesterol levels. These may be checked every 5 years, or more frequently if you are over 9 years old. Skin check. Lung cancer screening. You may have this screening every year starting at age 80 if you have a 30-pack-year history of smoking and currently smoke or have quit within the past 15 years. Fecal occult blood test (FOBT) of the stool. You may have this test every year starting at age 72. Flexible sigmoidoscopy or colonoscopy. You may have a sigmoidoscopy every 5 years or a colonoscopy every 10 years starting at age 55. Hepatitis C blood test. Hepatitis B blood test. Sexually transmitted disease (STD) testing. Diabetes screening. This is done by checking your blood sugar (glucose) after you have not eaten for a while (fasting). You may have this done every 1-3 years. Bone density scan. This is done to screen for osteoporosis. You may have this done starting at age 74. Mammogram. This may be done every 1-2 years. Talk to your health care provider about how often you should have regular mammograms. Talk with your health care provider about your test results, treatment options, and if necessary, the need for more tests. Vaccines  Your health care provider may recommend certain vaccines, such as: Influenza vaccine. This is recommended every year. Tetanus, diphtheria, and acellular pertussis (Tdap, Td) vaccine. You may need a Td booster every 10 years. Zoster vaccine. You may need this after age 86. Pneumococcal 13-valent conjugate (PCV13) vaccine. One dose is recommended after age 56. Pneumococcal polysaccharide (PPSV23) vaccine. One dose is recommended after age 62. Talk to your health care  provider about which screenings and vaccines you need and how often you need them. This information is  not intended to replace advice given to you by your health care provider. Make sure you discuss any questions you have with your health care provider. Document Released: 04/24/2015 Document Revised: 12/16/2015 Document Reviewed: 01/27/2015 Elsevier Interactive Patient Education  2017 [place] Prevention in the Home Falls can cause injuries. They can happen to people of all ages. There are many things you can do to make your home safe and to help prevent falls. What can I do on the outside of my home? Regularly fix the edges of walkways and driveways and fix any cracks. Remove anything that might make you trip as you walk through a door, such as a raised step or threshold. Trim any bushes or trees on the path to your home. Use bright outdoor lighting. Clear any walking paths of anything that might make someone trip, such as rocks or tools. Regularly check to see if handrails are loose or broken. Make sure that both sides of any steps have handrails. Any raised decks and porches should have guardrails on the edges. Have any leaves, snow, or ice cleared regularly. Use sand or salt on walking paths during winter. Clean up any spills in your garage right away. This includes oil or grease spills. What can I do in the bathroom? Use night lights. Install grab bars by the toilet and in the tub and shower. Do not use towel bars as grab bars. Use non-skid mats or decals in the tub or shower. If you need to sit down in the shower, use a plastic, non-slip stool. Keep the floor dry. Clean up any water that spills on the floor as soon as it happens. Remove soap buildup in the tub or shower regularly. Attach bath mats securely with double-sided non-slip rug tape. Do not have throw rugs and other things on the floor that can make you trip. What can I do in the bedroom? Use night lights. Make sure that you have a light by your bed that is easy to reach. Do not use any sheets or blankets that  are too big for your bed. They should not hang down onto the floor. Have a firm chair that has side arms. You can use this for support while you get dressed. Do not have throw rugs and other things on the floor that can make you trip. What can I do in the kitchen? Clean up any spills right away. Avoid walking on wet floors. Keep items that you use a lot in easy-to-reach places. If you need to reach something above you, use a strong step stool that has a grab bar. Keep electrical cords out of the way. Do not use floor polish or wax that makes floors slippery. If you must use wax, use non-skid floor wax. Do not have throw rugs and other things on the floor that can make you trip. What can I do with my stairs? Do not leave any items on the stairs. Make sure that there are handrails on both sides of the stairs and use them. Fix handrails that are broken or loose. Make sure that handrails are as long as the stairways. Check any carpeting to make sure that it is firmly attached to the stairs. Fix any carpet that is loose or worn. Avoid having throw rugs at the top or bottom of the stairs. If you do have throw rugs, attach them to the  floor with carpet tape. Make sure that you have a light switch at the top of the stairs and the bottom of the stairs. If you do not have them, ask someone to add them for you. What else can I do to help prevent falls? Wear shoes that: Do not have high heels. Have rubber bottoms. Are comfortable and fit you well. Are closed at the toe. Do not wear sandals. If you use a stepladder: Make sure that it is fully opened. Do not climb a closed stepladder. Make sure that both sides of the stepladder are locked into place. Ask someone to hold it for you, if possible. Clearly mark and make sure that you can see: Any grab bars or handrails. First and last steps. Where the edge of each step is. Use tools that help you move around (mobility aids) if they are needed. These  include: Canes. Walkers. Scooters. Crutches. Turn on the lights when you go into a dark area. Replace any light bulbs as soon as they burn out. Set up your furniture so you have a clear path. Avoid moving your furniture around. If any of your floors are uneven, fix them. If there are any pets around you, be aware of where they are. Review your medicines with your doctor. Some medicines can make you feel dizzy. This can increase your chance of falling. Ask your doctor what other things that you can do to help prevent falls. This information is not intended to replace advice given to you by your health care provider. Make sure you discuss any questions you have with your health care provider. Document Released: 01/22/2009 Document Revised: 09/03/2015 Document Reviewed: 05/02/2014 Elsevier Interactive Patient Education  2017 [employer].

## 2021-02-09 ENCOUNTER — Ambulatory Visit (INDEPENDENT_AMBULATORY_CARE_PROVIDER_SITE_OTHER): Payer: Medicare HMO | Admitting: Orthopedic Surgery

## 2021-02-09 ENCOUNTER — Other Ambulatory Visit: Payer: Self-pay

## 2021-02-09 ENCOUNTER — Encounter: Payer: Self-pay | Admitting: Orthopedic Surgery

## 2021-02-09 ENCOUNTER — Ambulatory Visit: Payer: Medicare HMO

## 2021-02-09 VITALS — Ht 66.0 in | Wt 214.0 lb

## 2021-02-09 DIAGNOSIS — S52531D Colles' fracture of right radius, subsequent encounter for closed fracture with routine healing: Secondary | ICD-10-CM

## 2021-02-09 NOTE — Progress Notes (Signed)
Orthopaedic Postop Note  Assessment: Teresa Arellano is a 70 y.o. female s/p ORIF of a right distal radius fracture  DOS: 12/31/20  Plan: Repeat radiographs obtained in clinic today demonstrates maintenance of alignment overall.  Compared to the most recent x-rays, there is been no interval displacement.  On the lateral view of the right wrist, the volar plate does look a little bit prominent, but this remains unchanged.  Her pain is improving.  Which indicates that the fracture is healing.  We will place her in a removable wrist splint, but I have asked her to keep this on at all times for the next 2 weeks.  She can remove the brace for hygiene, and gentle range of motion exercises.  She should focus on improving the range of motion in her wrist, as well as to her fingers.  Home exercise program was provided.  We will see her back in 4 weeks, and if she is struggling with her range of motion at that time, we can consider a referral to the hand therapist.  Follow-up: Return in about 4 weeks (around 03/09/2021). XR at next visit: Right wrist  Subjective:  Chief Complaint  Patient presents with   Routine Post Op    Rt hand DOS 12/31/20    History of Present Illness: Teresa Arellano is a 70 y.o. female who presents following the above stated procedure.  Surgery was 6 weeks ago.  She has done well with immobilization.  She is tolerated the cast.  She states her pain is improving.  No complaints at this time.  She denies fevers or chills.  Review of Systems: No fevers or chills No numbness or tingling No Chest Pain No shortness of breath   Objective: Ht 5\' 6"  (1.676 m)   Wt 214 lb (97.1 kg)   BMI 34.54 kg/m   Physical Exam:  Alert and oriented.  No acute distress.  Upon removal of cast, there is no underlying issues with her skin.  Diffuse dry skin.  Sensation is intact in the superficial radial nerve, median nerve and ulnar nerve distribution.  Her fingers are stiff, slightly  swollen.  She has difficulty flexing all fingers.  Active motions intact in the AIN/PIN/U nerve distribution.  She tolerates passive flexion and extension of the wrist, although it is limited.  Surgical incisions healing well, without surrounding erythema or drainage.  IMAGING: I personally ordered and reviewed the following images:  X-rays of the right wrist were obtained in clinic today and demonstrates maintenance of alignment overall.  There is been no interval subsidence of the fracture.  Hardware remains intact, without evidence of failure or loosening.  On the lateral view, the plate remains slightly prominent distally, but this is unchanged compared to previous x-rays.  There has been interval consolidation of the comminuted fracture site.  Joint line remains intact without obvious intra-articular step-off.  Impression: Healing right distal radius fracture, following operative fixation.   Mordecai Rasmussen, MD 02/09/2021 12:51 PM

## 2021-02-09 NOTE — Patient Instructions (Addendum)
Brace at all times for the next 2 weeks.  You can remove the brace for hygiene, and to start with stretching exercises.  Do not lift anything heavier than a coffee cup until we see you in 4 weeks.     Wrist Fracture Rehab Ask your health care provider which exercises are safe for you. Do exercises exactly as told by your health care provider and adjust them as directed. It is normal to feel mild stretching, pulling, tightness, or discomfort as you do these exercises. Stop right away if you feel sudden pain or your pain gets worse. Do not begin these exercises until told by your health care provider. Stretching and range-of-motion exercises These exercises warm up your muscles and joints and improve the movement and flexibility of your wrist and hand. These exercises also help to relieve pain,numbness, and tingling. Finger flexion and extension Sit or stand with your elbow at your side. Open and stretch your left / right fingers as wide as you can (extension). Hold this position for 10 seconds. Close your left / right fingers into a gentle fist (flexion). Hold this position for 10 seconds. Slowly return to the starting position. Repeat 10 times. Complete this exercise 1-2 times a day. Wrist flexion Bend your left / right elbow to a 90-degree angle (right angle) with your palm facing the floor. Bend your wrist forward so your fingers point toward the floor (flexion). Hold this position for 10 seconds. Slowly return to the starting position. Repeat 10 times. Complete this exercise 1-2 times a day. Wrist extension Bend your left / right elbow to a 90-degree angle (right angle) with your palm facing the floor. Bend your wrist backward so your fingers point toward the ceiling (extension). Hold this position for 10 seconds. Slowly return to the starting position. Repeat 10 times. Complete this exercise 1-2 times a day. Ulnar deviation Bend your left / right elbow to a 90-degree angle (right  angle), and rest your forearm on a table with your palm facing down. Keeping your hand flat on the table, bend your left / right wrist toward your small finger (pinkie). This is ulnar deviation. Hold this position for 10 seconds. Slowly return to the starting position. Repeat 10 times. Complete this exercise 1-2 times a day. Radial deviation Bend your left / right elbow to a 90-degree angle (right angle), and rest your forearm on a table with your palm facing down. Keeping your hand flat on the table, bend your left / right wrist toward your thumb. This is radial deviation. Hold this position for 10 seconds. Slowly return to the starting position. Repeat 10 times. Complete this exercise 1-2 times a day. Forearm rotation, supination Stand or sit with your left / right elbow bent to a 90-degree angle (right angle) at your side. Position your forearm so that the thumb is facing the ceiling (neutral position). Turn (rotate) your palm up toward the ceiling (supination), stopping when you feel a gentle stretch. Hold this position for 10 seconds. Slowly return to the starting position. Repeat 10 times. Complete this exercise 1-2 times a day. Forearm rotation, pronation Stand or sit with your left / right elbow bent to a 90-degree angle (right angle) at your side. Position your forearm so that the thumb is facing the ceiling (neutral position). Turn (rotate) your palm down toward the floor (pronation), stopping when you feel a gentle stretch. Hold this position for 10 seconds. Slowly return to the starting position. Repeat 10 times. Complete this  exercise 1-2 times a day. Wrist flexion stretch  Extend your left / right arm in front of you and turn your palm down toward the floor. If told by your health care provider, bend your left / right arm to a 90-degree angle (right angle) at your side. Using your uninjured hand, gently press over the back of your left / right hand to bend your wrist and  fingers toward the floor (flexion). Go as far as you can to feel a stretch without causing pain. Hold this position for 10 seconds. Slowly return to the starting position. Repeat 10 times. Complete this exercise 1-2 times a day. Wrist extension stretch  Extend your left / right arm in front of you and turn your palm up toward the ceiling. If told by your health care provider, bend your left / right arm to a 90-degree angle (right angle) at your side. Using your uninjured hand, gently press over the palm of your left / right hand to bend your wrist and fingers toward the floor (extension). Go as far as you can to feel a stretch without causing pain. Hold this position for 10 seconds. Slowly return to the starting position. Repeat 10 times. Complete this exercise 1-2 times a day. Forearm rotation stretch, supination Stand or sit with your arms at your sides. Bend your left / right elbow to a 90-degree angle (right angle). Using your uninjured hand, turn your left / right palm up toward the ceiling (assisted supination) until you feel a gentle stretch in the inside of your forearm. Hold this position for 10 seconds. Slowly return to the starting position. Repeat 10 times. Complete this exercise 1-2 times a day. Forearm rotation stretch, pronation Stand or sit with your arms at your sides. Bend your left / right elbow to a 90-degree angle (right angle). Using your uninjured hand, turn your left / right palm down toward the floor (assisted pronation) until you feel a gentle stretch in the top of your forearm. Hold this position for 10 seconds. Slowly return to the starting position. Repeat 10 times. Complete this exercise 1-2 times a day.      Hand Exercises  Hand exercises can be helpful for almost anyone. These exercises can strengthen the hands, improve flexibility and movement, and increase blood flow to the hands. These results can make work and daily tasks easier. Hand exercises  can be especially helpful for people who have joint pain from arthritis or have nerve damage from overuse (carpal tunnel syndrome). These exercises can also help people who have injured a hand.  Exercises Most of these hand exercises are gentle stretching and motion exercises. It is usually safe to do them often throughout the day. Warming up your hands before exercise may help to reduce stiffness. You can do this with gentle massage or by placing your hands in warm water for 10-15 minutes. It is normal to feel some stretching, pulling, tightness, or mild discomfort as you begin new exercises. This will gradually improve. Stop an exercise right away if you feel sudden, severe pain or your pain gets worse. Ask your health care provider which exercises are best for you. Knuckle bend or "claw" fist Stand or sit with your arm, hand, and all five fingers pointed straight up. Make sure to keep your wrist straight during the exercise. Gently bend your fingers down toward your palm until the tips of your fingers are touching the top of your palm. Keep your big knuckle straight and just bend  the small knuckles in your fingers. Hold this position for 10 seconds. Straighten (extend) your fingers back to the starting position. Repeat this exercise 5-10 times with each hand. Full finger fist Stand or sit with your arm, hand, and all five fingers pointed straight up. Make sure to keep your wrist straight during the exercise. Gently bend your fingers into your palm until the tips of your fingers are touching the middle of your palm. Hold this position for 10 seconds. Extend your fingers back to the starting position, stretching every joint fully. Repeat this exercise 5-10 times with each hand. Straight fist Stand or sit with your arm, hand, and all five fingers pointed straight up. Make sure to keep your wrist straight during the exercise. Gently bend your fingers at the big knuckle, where your fingers meet  your hand, and the middle knuckle. Keep the knuckle at the tips of your fingers straight and try to touch the bottom of your palm. Hold this position for 10 seconds. Extend your fingers back to the starting position, stretching every joint fully. Repeat this exercise 5-10 times with each hand. Tabletop Stand or sit with your arm, hand, and all five fingers pointed straight up. Make sure to keep your wrist straight during the exercise. Gently bend your fingers at the big knuckle, where your fingers meet your hand, as far down as you can while keeping the small knuckles in your fingers straight. Think of forming a tabletop with your fingers. Hold this position for 10 seconds. Extend your fingers back to the starting position, stretching every joint fully. Repeat this exercise 5-10 times with each hand. Finger spread Place your hand flat on a table with your palm facing down. Make sure your wrist stays straight as you do this exercise. Spread your fingers and thumb apart from each other as far as you can until you feel a gentle stretch. Hold this position for 10 seconds. Bring your fingers and thumb tight together again. Hold this position for 10 seconds. Repeat this exercise 5-10 times with each hand. Making circles Stand or sit with your arm, hand, and all five fingers pointed straight up. Make sure to keep your wrist straight during the exercise. Make a circle by touching the tip of your thumb to the tip of your index finger. Hold for 10 seconds. Then open your hand wide. Repeat this motion with your thumb and each finger on your hand. Repeat this exercise 5-10 times with each hand. Thumb motion Sit with your forearm resting on a table and your wrist straight. Your thumb should be facing up toward the ceiling. Keep your fingers relaxed as you move your thumb. Lift your thumb up as high as you can toward the ceiling. Hold for 10 seconds. Bend your thumb across your palm as far as you can,  reaching the tip of your thumb for the small finger (pinkie) side of your palm. Hold for 10 seconds. Repeat this exercise 5-10 times with each hand.    Contact a health care provider if: Your hand pain or discomfort gets much worse when you do an exercise. Your hand pain or discomfort does not improve within 2 hours after you exercise. If you have any of these problems, stop doing these exercises right away. Do not do them again unless your health care provider says that you can.    Get help right away if: You develop sudden, severe hand pain or swelling. If this happens, stop doing these exercises right away.  Do not do them again unless your health care provider says that you can. This information is not intended to replace advice given to you by your health care provider. Make sure you discuss any questions you have with your health care provider.

## 2021-02-10 ENCOUNTER — Ambulatory Visit (INDEPENDENT_AMBULATORY_CARE_PROVIDER_SITE_OTHER): Payer: Medicare HMO

## 2021-02-10 DIAGNOSIS — Z23 Encounter for immunization: Secondary | ICD-10-CM | POA: Diagnosis not present

## 2021-03-08 ENCOUNTER — Other Ambulatory Visit: Payer: Self-pay | Admitting: Family Medicine

## 2021-03-10 ENCOUNTER — Encounter: Payer: Self-pay | Admitting: Orthopedic Surgery

## 2021-03-10 ENCOUNTER — Other Ambulatory Visit: Payer: Self-pay

## 2021-03-10 ENCOUNTER — Ambulatory Visit: Payer: Medicare HMO

## 2021-03-10 ENCOUNTER — Ambulatory Visit: Payer: Medicare HMO | Admitting: Orthopedic Surgery

## 2021-03-10 VITALS — Ht 66.0 in | Wt 214.0 lb

## 2021-03-10 DIAGNOSIS — S52531D Colles' fracture of right radius, subsequent encounter for closed fracture with routine healing: Secondary | ICD-10-CM

## 2021-03-10 NOTE — Progress Notes (Signed)
Orthopaedic Postop Note  Assessment: Teresa Arellano is a 70 y.o. female s/p ORIF of a right distal radius fracture  DOS: 12/31/20  Plan: Radiographs are stable.  No further subsidence of the hardware. Pain is controlled with OTC medications She will transition out of the splint and focus on ROM of her wrist and fingers.  Exercises demonstrated in clinic today. She still prefers to do the therapy on her own. Follow up in 4 weeks.   Follow-up: Return in about 5 weeks (around 04/13/2021). XR at next visit: Right wrist  Subjective:  Chief Complaint  Patient presents with   Routine Post Op    Rt wrist DOS 12/31/20    History of Present Illness: Teresa Arellano is a 70 y.o. female who presents following the above stated procedure.  Surgery was approximately 10 weeks ago.  She continues to do well overall.  Her pain is controlled with ibuprofen.  She has been wearing the removable wrist splint most of the time since I last saw her.  She has been working on some range of motion exercises.  She feels she is getting better.  Review of Systems: No fevers or chills No numbness or tingling No Chest Pain No shortness of breath   Objective: Ht 5\' 6"  (1.676 m)   Wt 214 lb (97.1 kg)   BMI 34.54 kg/m   Physical Exam:  Alert and oriented.  No acute distress.  Surgical incision is healing well.  No surrounding erythema or drainage.  There is no sensitivity along the incision.  Minimal tenderness to palpation about the wrist.  Limited active range of motion of the wrist, including flexion, extension and supination.  Passively, she tolerates 30 degrees of flexion, 40 degrees of extension, and almost full flexion of all fingers.  Sensation is intact throughout the right hand.  2+ radial pulse.  She has full pronation.  Supination limited to 45 degrees.  IMAGING: I personally ordered and reviewed the following images:  X-ray of the right wrist was obtained in clinic today.  These were  compared to previous x-rays.  There has been interval consolidation of the comminuted, intra-articular distal radius fracture.  Hardware remains intact.  No evidence of failure or loosening.  There has been no further subsidence of the fracture.  Hardware is not shifted.  Joint line remains neutral.  Impression: Right distal radius fracture in stable position, following operative fixation.  No evidence of hardware failure or loosening.   Mordecai Rasmussen, MD 03/10/2021 10:21 PM

## 2021-03-10 NOTE — Patient Instructions (Signed)
Focus on range of motion  Gradual stretching of your wrist and fingers  Focus on making a full fist

## 2021-04-08 ENCOUNTER — Other Ambulatory Visit (HOSPITAL_COMMUNITY): Payer: Self-pay | Admitting: Family Medicine

## 2021-04-08 DIAGNOSIS — Z1231 Encounter for screening mammogram for malignant neoplasm of breast: Secondary | ICD-10-CM

## 2021-04-13 ENCOUNTER — Ambulatory Visit: Payer: Medicare HMO

## 2021-04-13 ENCOUNTER — Encounter: Payer: Self-pay | Admitting: Orthopedic Surgery

## 2021-04-13 ENCOUNTER — Ambulatory Visit (INDEPENDENT_AMBULATORY_CARE_PROVIDER_SITE_OTHER): Payer: Medicare HMO | Admitting: Orthopedic Surgery

## 2021-04-13 ENCOUNTER — Other Ambulatory Visit: Payer: Self-pay

## 2021-04-13 VITALS — Ht 66.0 in | Wt 214.0 lb

## 2021-04-13 DIAGNOSIS — S52531D Colles' fracture of right radius, subsequent encounter for closed fracture with routine healing: Secondary | ICD-10-CM | POA: Diagnosis not present

## 2021-04-13 NOTE — Progress Notes (Signed)
Orthopaedic Postop Note  Assessment: Teresa Arellano is a 71 y.o. female s/p ORIF of a right distal radius fracture  DOS: 12/31/20  Plan: Radiographs are stable.   Flexion and extension at the wrist is close to normal. Just short of full fist. She would like to continue with exercises on her own. Reviewed appropriate exercises. Briefly discussed removal of hardware, and she is aware that this is a strong consideration. Will follow-up in 3 months for repeat evaluation.   Follow-up: Return in about 3 months (around 07/12/2021). XR at next visit: Right wrist  Subjective:  Chief Complaint  Patient presents with   Routine Post Op    Rt wrist DOS 12/31/20    History of Present Illness: Teresa Arellano is a 71 y.o. female who presents following the above stated procedure.  Surgery was approximately 3 months ago.  She is doing very well.  She is very pleased with her progress.  She is able to do most things with her right hand at this point.  She has been doing exercises specifically for her fingers, and is almost able to make a full fist.  Minimal pain at this time.  No numbness or tingling.  No issues with her surgical incisions.   Review of Systems: No fevers or chills No numbness or tingling No Chest Pain No shortness of breath   Objective: Ht 5\' 6"  (1.676 m)    Wt 214 lb (97.1 kg)    BMI 34.54 kg/m   Physical Exam:  Alert and oriented.  No acute distress.  Volar wrist incision is healing well.  No surrounding erythema or drainage.  She has full extension, flexion, pronation and supination of the wrist.  No pain or crepitus with range of motion.  Just a few millimeters short of full flexion and a full fist of all of her fingers.  Fingers are warm and well-perfused.  Sensations intact in all nerve distributions.  Brisk capillary refill.  2+ radial pulse.  IMAGING: I personally ordered and reviewed the following images:  X-ray of the right wrist was obtained in clinic  today, compared to previous x-rays.  Right distal radius fracture with interval consolidation.  No further subsidence of the hardware.  No acute injuries are noted.  Impression: Healing right distal radius fracture without hardware complication.   Mordecai Rasmussen, MD 04/13/2021 10:13 AM

## 2021-04-19 ENCOUNTER — Ambulatory Visit (HOSPITAL_COMMUNITY)
Admission: RE | Admit: 2021-04-19 | Discharge: 2021-04-19 | Disposition: A | Discharge status: Home / Self Care | Payer: Medicare HMO | Source: Ambulatory Visit | Attending: Family Medicine | Admitting: Family Medicine

## 2021-04-19 ENCOUNTER — Other Ambulatory Visit: Payer: Self-pay

## 2021-04-19 DIAGNOSIS — Z1231 Encounter for screening mammogram for malignant neoplasm of breast: Secondary | ICD-10-CM | POA: Diagnosis not present

## 2021-05-19 ENCOUNTER — Encounter: Payer: Self-pay | Admitting: *Deleted

## 2021-05-19 DIAGNOSIS — Z961 Presence of intraocular lens: Secondary | ICD-10-CM | POA: Diagnosis not present

## 2021-05-19 DIAGNOSIS — H524 Presbyopia: Secondary | ICD-10-CM | POA: Diagnosis not present

## 2021-05-19 DIAGNOSIS — H26493 Other secondary cataract, bilateral: Secondary | ICD-10-CM | POA: Diagnosis not present

## 2021-05-19 DIAGNOSIS — H16223 Keratoconjunctivitis sicca, not specified as Sjogren's, bilateral: Secondary | ICD-10-CM | POA: Diagnosis not present

## 2021-06-03 ENCOUNTER — Ambulatory Visit (INDEPENDENT_AMBULATORY_CARE_PROVIDER_SITE_OTHER): Payer: Medicare HMO | Admitting: Family Medicine

## 2021-06-03 ENCOUNTER — Other Ambulatory Visit: Payer: Self-pay

## 2021-06-03 ENCOUNTER — Encounter: Payer: Self-pay | Admitting: Family Medicine

## 2021-06-03 VITALS — BP 150/83 | HR 82 | Resp 16 | Ht 66.0 in | Wt 221.0 lb

## 2021-06-03 DIAGNOSIS — I10 Essential (primary) hypertension: Secondary | ICD-10-CM | POA: Diagnosis not present

## 2021-06-03 DIAGNOSIS — Z0001 Encounter for general adult medical examination with abnormal findings: Secondary | ICD-10-CM | POA: Diagnosis not present

## 2021-06-03 DIAGNOSIS — E559 Vitamin D deficiency, unspecified: Secondary | ICD-10-CM

## 2021-06-03 DIAGNOSIS — R0683 Snoring: Secondary | ICD-10-CM

## 2021-06-03 DIAGNOSIS — Z13220 Encounter for screening for lipoid disorders: Secondary | ICD-10-CM

## 2021-06-03 MED ORDER — TRIAMTERENE-HCTZ 37.5-25 MG PO TABS: ORAL_TABLET | ORAL | 1 refills | Status: DC

## 2021-06-03 NOTE — Assessment & Plan Note (Signed)
uncontrpolled , inc mazide dose DASH diet and commitment to daily physical activity for a minimum of 30 minutes discussed and encouraged, as a part of hypertension management. The importance of attaining a healthy weight is also discussed.  BP/Weight 06/03/2021 04/13/2021 03/10/2021 02/09/2021 12/31/2020 12/29/2020 07/12/4740  Systolic BP 595 - - - 638 756 433  Diastolic BP 83 - - - 84 93 111  Wt. (Lbs) 221 214 214 214 - 214 219.6  BMI 35.67 34.54 34.54 34.54 - 34.54 35.44

## 2021-06-03 NOTE — Assessment & Plan Note (Signed)
°  Patient re-educated about  the importance of commitment to a  minimum of 150 minutes of exercise per week as able.  The importance of healthy food choices with portion control discussed, as well as eating regularly and within a 12 hour window most days. The need to choose "clean , [place]" food 50 to 75% of the time is discussed, as well as to make water the primary drink and set a goal of 64 ounces water daily.    Weight /BMI 06/03/2021 04/13/2021 03/10/2021  WEIGHT 221 lb 214 lb 214 lb  HEIGHT 5\' 6"  5\' 6"  5\' 6"   BMI 35.67 kg/m2 34.54 kg/m2 34.54 kg/m2

## 2021-06-03 NOTE — Assessment & Plan Note (Signed)

## 2021-06-03 NOTE — Patient Instructions (Addendum)
F/u in 8 to 10 weeks, re evaluate blood pressure, and other chronic problems, call if you need me sooner  Annual exam in 12 months  Increase  in dose of triamterene to one and a half tablets once daily as blood pressure is too high  Please get 1 week before next visit fasting CBC, lipid, cmp and eGFR, tSH and vit D  Please get shingrix vaccines at the pharmacy  You are referred for evaluation for sleep apnea  Work on weight loss wih increasing vegetable and water intake  Thanks for choosing [facility], we consider it a privelige to serve you.

## 2021-06-07 ENCOUNTER — Encounter: Payer: Self-pay | Admitting: Family Medicine

## 2021-06-07 NOTE — Assessment & Plan Note (Signed)
Reports snoring with excessive daytime sleepiness refer tor  eval for OSA

## 2021-06-07 NOTE — Progress Notes (Signed)
° ° °  Teresa Arellano     MRN: [id number]      DOB: Aug 23, 1950  HPI: Patient is in for annual physical exam. Uncointrolled hypertension is addressed C/o excessive snoring and daytime sleepiness, needs eval for OSA. Weight gain and obesity are addressed with counseling    PE: BP (!) 150/83    Pulse 82    Resp 16    Ht 5\' 6"  (1.676 m)    Wt 221 lb (100.2 kg)    SpO2 97%    BMI 35.67 kg/m   Pleasant  female, alert and oriented x 3, in no cardio-pulmonary distress. Afebrile. HEENT No facial trauma or asymetry. Sinuses non tender.  Extra occullar muscles intact.. External ears normal, . Thick Neck: supple, no adenopathy,JVD or thyromegaly.No bruits.  Chest: Clear to ascultation bilaterally.No crackles or wheezes. Non tender to palpation  Cardiovascular system; Heart sounds normal,  S1 and  S2 ,no S3.  No murmur, or thrill. Apical beat not displaced Peripheral pulses normal.  Abdomen: Soft, non tender, no palpable mass or organomegaly. Normal BS    Musculoskeletal exam: Full ROM of spine, hips , shoulders and knees. No deformity ,swelling or crepitus noted. No muscle wasting or atrophy.   Neurologic: Cranial nerves 2 to 12 intact. Power, tone ,sensation and reflexes normal throughout. No disturbance in gait. No tremor.  Skin: Intact, no ulceration, erythema , scaling or rash noted. Pigmentation normal throughout  Psych; Normal mood and affect. Judgement and concentration normal   Assessment & Plan:  Annual visit for general adult medical examination with abnormal findings Annual exam as documented. Counseling done  re healthy lifestyle involving commitment to 150 minutes exercise per week, heart healthy diet, and attaining healthy weight.The importance of adequate sleep also discussed. Regular seat belt use and home safety, is also discussed. Changes in health habits are decided on by the patient with goals and time frames  set for achieving them. Immunization  and cancer screening needs are specifically addressed at this visit.   Essential hypertension, benign uncontrpolled , inc mazide dose DASH diet and commitment to daily physical activity for a minimum of 30 minutes discussed and encouraged, as a part of hypertension management. The importance of attaining a healthy weight is also discussed.  BP/Weight 06/03/2021 04/13/2021 03/10/2021 02/09/2021 12/31/2020 12/29/2020 0/34/9179  Systolic BP 150 - - - 569 794 801  Diastolic BP 83 - - - 84 93 111  Wt. (Lbs) 221 214 214 214 - 214 219.6  BMI 35.67 34.54 34.54 34.54 - 34.54 35.44       Morbid obesity (HCC)  Patient re-educated about  the importance of commitment to a  minimum of 150 minutes of exercise per week as able.  The importance of healthy food choices with portion control discussed, as well as eating regularly and within a 12 hour window most days. The need to choose "clean , [place]" food 50 to 75% of the time is discussed, as well as to make water the primary drink and set a goal of 64 ounces water daily.    Weight /BMI 06/03/2021 04/13/2021 03/10/2021  WEIGHT 221 lb 214 lb 214 lb  HEIGHT 5\' 6"  5\' 6"  5\' 6"   BMI 35.67 kg/m2 34.54 kg/m2 34.54 kg/m2      Snoring Reports snoring with excessive daytime sleepiness refer tor  eval for OSA

## 2021-07-13 ENCOUNTER — Ambulatory Visit: Payer: Medicare HMO

## 2021-07-13 ENCOUNTER — Ambulatory Visit (INDEPENDENT_AMBULATORY_CARE_PROVIDER_SITE_OTHER): Payer: Worker's Compensation | Admitting: Orthopedic Surgery

## 2021-07-13 ENCOUNTER — Encounter: Payer: Self-pay | Admitting: Orthopedic Surgery

## 2021-07-13 DIAGNOSIS — S52531D Colles' fracture of right radius, subsequent encounter for closed fracture with routine healing: Secondary | ICD-10-CM

## 2021-07-13 NOTE — Progress Notes (Signed)
Orthopaedic Postop Note ? ?Assessment: ?Teresa Arellano is a 71 y.o. female s/p ORIF of a right distal radius fracture ? ?DOS: 12/31/20 ? ?Plan: ?Radiographs are stable.  She has regained full range of motion of her wrist, with slight restriction of flexion.  She has full pronation and supination.  Her grip strength has improved.  She is pleased with her progress.  No concerns at this time.  We briefly discussed hardware removal, and she will contact the clinic if she has any further issues. ? ? ?Follow-up: ?Return if symptoms worsen or fail to improve. ?XR at next visit: Right wrist ? ?Subjective: ? ?Chief Complaint  ?Patient presents with  ? Wrist Pain  ?   s/p ORIF of a right distal radius fracture ?  ?DOS: 12/31/20 ?  ? ?Followup right wrist pain, doing ok, tylenol occasionally but really no problems.  ? ?  ? ? ?History of Present Illness: ?Teresa Arellano is a 71 y.o. female who presents following the above stated procedure.  Surgery was approximately 6 months ago.  No concerns.  She is not taking any medications for her wrist.  She has regained her motion and function.  She is very pleased with her recovery.  She is not interested in removal of the hardware at this time.   ? ?Review of Systems: ?No fevers or chills ?No numbness or tingling ?No Chest Pain ?No shortness of breath ? ? ?Objective: ?There were no vitals taken for this visit. ? ?Physical Exam: ? ?Alert and oriented.  No acute distress. ? ?Surgical incision is healing well.  No surrounding erythema or drainage.  Fingers are warm and well-perfused.  She is able to make a fist.  Grip strength is 4+/5.  Full extension, with slightly restricted flexion of the wrist.  She has full pronation and supination.  2+ radial pulse. ? ?IMAGING: ?I personally ordered and reviewed the following images: ? ?X-ray of the right wrist was obtained in clinic today.  There has been no subsidence of the fracture.  Hardware remains intact.  No evidence of screws backing out.   There has been no interval displacement of the fracture or evidence of hardware failure. ? ?Impression: Healed right distal radius fracture following operative fixation, without hardware complication. ? ?Mordecai Rasmussen, MD ?07/13/2021 ?10:41 AM ? ? ?

## 2021-07-23 DIAGNOSIS — I10 Essential (primary) hypertension: Secondary | ICD-10-CM | POA: Diagnosis not present

## 2021-07-23 DIAGNOSIS — E559 Vitamin D deficiency, unspecified: Secondary | ICD-10-CM | POA: Diagnosis not present

## 2021-07-23 DIAGNOSIS — Z13220 Encounter for screening for lipoid disorders: Secondary | ICD-10-CM | POA: Diagnosis not present

## 2021-07-24 LAB — CMP14+EGFR
ALT: 17 IU/L (ref 0–32)
AST: 21 IU/L (ref 0–40)
Albumin/Globulin Ratio: 1.2 (ref 1.2–2.2)
Albumin: 4.3 g/dL (ref 3.8–4.8)
Alkaline Phosphatase: 61 IU/L (ref 44–121)
BUN/Creatinine Ratio: 22 (ref 12–28)
BUN: 18 mg/dL (ref 8–27)
Bilirubin Total: 0.7 mg/dL (ref 0.0–1.2)
CO2: 24 mmol/L (ref 20–29)
Calcium: 9.6 mg/dL (ref 8.7–10.3)
Chloride: 103 mmol/L (ref 96–106)
Creatinine, Ser: 0.83 mg/dL (ref 0.57–1.00)
Globulin, Total: 3.5 g/dL (ref 1.5–4.5)
Glucose: 87 mg/dL (ref 70–99)
Potassium: 3.7 mmol/L (ref 3.5–5.2)
Sodium: 140 mmol/L (ref 134–144)
Total Protein: 7.8 g/dL (ref 6.0–8.5)
eGFR: 76 mL/min/{1.73_m2} (ref >59)

## 2021-07-24 LAB — CBC
Hematocrit: 41.6 % (ref 34.0–46.6)
Hemoglobin: 13.3 g/dL (ref 11.1–15.9)
MCH: 25.9 pg — ABNORMAL LOW (ref 26.6–33.0)
MCHC: 32 g/dL (ref 31.5–35.7)
MCV: 81 fL (ref 79–97)
Platelets: 295 10*3/uL (ref 150–450)
RBC: 5.14 x10E6/uL (ref 3.77–5.28)
RDW: 13.2 % (ref 11.7–15.4)
WBC: 8.5 10*3/uL (ref 3.4–10.8)

## 2021-07-24 LAB — VITAMIN D 25 HYDROXY (VIT D DEFICIENCY, FRACTURES): Vit D, 25-Hydroxy: 39.2 ng/mL (ref 30.0–100.0)

## 2021-07-24 LAB — LIPID PANEL
Chol/HDL Ratio: 2.8 ratio (ref 0.0–4.4)
Cholesterol, Total: 162 mg/dL (ref 100–199)
HDL: 57 mg/dL (ref >39)
LDL Chol Calc (NIH): 89 mg/dL (ref 0–99)
Triglycerides: 85 mg/dL (ref 0–149)
VLDL Cholesterol Cal: 16 mg/dL (ref 5–40)

## 2021-07-24 LAB — TSH: TSH: 0.43 u[IU]/mL — ABNORMAL LOW (ref 0.450–4.500)

## 2021-07-28 ENCOUNTER — Ambulatory Visit (INDEPENDENT_AMBULATORY_CARE_PROVIDER_SITE_OTHER): Payer: Medicare HMO | Admitting: Family Medicine

## 2021-07-28 ENCOUNTER — Encounter: Payer: Self-pay | Admitting: Family Medicine

## 2021-07-28 VITALS — BP 130/82 | HR 85 | Ht 66.0 in | Wt 216.0 lb

## 2021-07-28 DIAGNOSIS — I10 Essential (primary) hypertension: Secondary | ICD-10-CM | POA: Diagnosis not present

## 2021-07-28 DIAGNOSIS — M25561 Pain in right knee: Secondary | ICD-10-CM

## 2021-07-28 NOTE — Assessment & Plan Note (Signed)
Unstable and painful right knee, rated a 7 to 10, nreeds Ortho eval asap ?

## 2021-07-28 NOTE — Assessment & Plan Note (Signed)
Controlled, no change in medication ?DASH diet and commitment to daily physical activity for a minimum of 30 minutes discussed and encouraged, as a part of hypertension management. ?The importance of attaining a healthy weight is also discussed. ? ? ?  07/28/2021  ? 11:37 AM 07/28/2021  ? 11:16 AM 06/03/2021  ?  2:09 PM 04/13/2021  ?  9:41 AM 03/10/2021  ?  9:47 AM 02/09/2021  ? 10:17 AM 12/31/2020  ? 10:21 AM  ?BP/Weight  ?Systolic BP 449 753 005    118  ?Diastolic BP 82 83 83    84  ?Wt. (Lbs)  216 221 214 214 214   ?BMI  34.86 kg/m2 35.67 kg/m2 34.54 kg/m2 34.54 kg/m2 34.54 kg/m2   ? ? ? ? ?

## 2021-07-28 NOTE — Patient Instructions (Signed)
F/U in 6 months, fl;u vaccine at visit , call if you need me sooner ? ?You are referred urgently to Orthopedics re right knee pain, be careful not to fall ? ?Congrats on weight loss, keep it up! ? ?Blood pressure is good, no changes in medication ? ?Thanks for choosing [facility], we consider it a privelige to serve you. ? ?

## 2021-07-28 NOTE — Progress Notes (Signed)
? ?Teresa Arellano     MRN: [id number]      DOB: 08-May-1950 ? ? ?HPI ?Teresa Arellano is here for follow up and re-evaluation of chronic medical conditions, in particular uncontrolled HTN, medication management and review of any available recent lab and radiology data.  ?Preventive health is updated, specifically  Cancer screening and Immunization.   ?Questions or concerns regarding consultations or procedures which the PT has had in the interim are  addressed. ?The PT denies any adverse reactions to current medications since the last visit.  ?4 to 6 week h/o increased and uncontrolled right knee pain and feeling of instability in particular when she first stands, no falls , but feels uunstable ?Long h/o arthritis in the knee, norecent trauma ?Has worked on [employer] loss wiith success, decreased and more controlled eating ?ROS ?Denies recent fever or chills. ?Denies sinus pressure, nasal congestion, ear pain or sore throat. ?Denies chest congestion, productive cough or wheezing. ?Denies chest pains, palpitations and leg swelling ?Denies abdominal pain, nausea, vomiting,diarrhea or constipation.   ?Denies dysuria, frequency, hesitancy or incontinence. ?Denies headaches, seizures, numbness, or tingling. ?Denies depression, anxiety or insomnia. ?Denies skin break down or rash. ? ? ?PE ? ?BP 130/82   Pulse 85   Ht '5\' 6"'$  (1.676 m)   Wt 216 lb (98 kg)   SpO2 98%   BMI 34.86 kg/m?  ? ?Patient alert and oriented and in no cardiopulmonary distress. ? ?HEENT: No facial asymmetry, EOMI,     Neck supple . ? ?Chest: Clear to auscultation bilaterally. ? ?CVS: S1, S2 no murmurs, no S3.Regular rate. ? ?ABD: Soft non tender.  ? ?Ext: No edema ? ?MS: Adequate ROM spine, shoulders, hips and reduced in right knee with tenderness over lateral aspect of right knee ? ?Skin: Intact, no ulcerations or rash noted. ? ?Psych: Good eye contact, normal affect. Memory intact not anxious or depressed appearing. ? ?CNS: CN 2-12 intact, power,  normal  throughout.no focal deficits noted. ? ? ?Assessment & Plan ? ?Knee pain ?Unstable and painful right knee, rated a 7 to 10, nreeds Ortho eval asap ? ?Morbid obesity ([place]) ? ?Patient re-educated about  the importance of commitment to a  minimum of 150 minutes of exercise per week as able. ? ?The importance of healthy food choices with portion control discussed, as well as eating regularly and within a 12 hour window most days. ?The need to choose "clean , [place]" food 50 to 75% of the time is discussed, as well as to make water the primary drink and set a goal of 64 ounces water daily. ? ?  ? ?  07/28/2021  ? 11:16 AM 06/03/2021  ?  2:09 PM 04/13/2021  ?  9:41 AM  ?Weight /BMI  ?Weight 216 lb 221 lb 214 lb  ?Height '5\' 6"'$  (1.676 m) '5\' 6"'$  (1.676 m) '5\' 6"'$  (1.676 m)  ?BMI 34.86 kg/m2 35.67 kg/m2 34.54 kg/m2  ? ? ? ? ?Essential hypertension, benign ?Controlled, no change in medication ?DASH diet and commitment to daily physical activity for a minimum of 30 minutes discussed and encouraged, as a part of hypertension management. ?The importance of attaining a healthy weight is also discussed. ? ? ?  07/28/2021  ? 11:37 AM 07/28/2021  ? 11:16 AM 06/03/2021  ?  2:09 PM 04/13/2021  ?  9:41 AM 03/10/2021  ?  9:47 AM 02/09/2021  ? 10:17 AM 12/31/2020  ? 10:21 AM  ?BP/Weight  ?Systolic BP 258 527 782  341  ?Diastolic BP 82 83 83    84  ?Wt. (Lbs)  216 221 214 214 214   ?BMI  34.86 kg/m2 35.67 kg/m2 34.54 kg/m2 34.54 kg/m2 34.54 kg/m2   ? ? ? ? ? ?

## 2021-07-28 NOTE — Assessment & Plan Note (Signed)
?  Patient re-educated about  the importance of commitment to a  minimum of 150 minutes of exercise per week as able. ? ?The importance of healthy food choices with portion control discussed, as well as eating regularly and within a 12 hour window most days. ?The need to choose "clean , [place]" food 50 to 75% of the time is discussed, as well as to make water the primary drink and set a goal of 64 ounces water daily. ? ?  ? ?  07/28/2021  ? 11:16 AM 06/03/2021  ?  2:09 PM 04/13/2021  ?  9:41 AM  ?Weight /BMI  ?Weight 216 lb 221 lb 214 lb  ?Height '5\' 6"'$  (1.676 m) '5\' 6"'$  (1.676 m) '5\' 6"'$  (1.676 m)  ?BMI 34.86 kg/m2 35.67 kg/m2 34.54 kg/m2  ? ? ? ?

## 2021-08-03 ENCOUNTER — Encounter: Payer: Self-pay | Admitting: Orthopedic Surgery

## 2021-08-03 ENCOUNTER — Ambulatory Visit: Payer: Medicare HMO | Admitting: Orthopedic Surgery

## 2021-08-03 ENCOUNTER — Ambulatory Visit (INDEPENDENT_AMBULATORY_CARE_PROVIDER_SITE_OTHER): Payer: Medicare HMO

## 2021-08-03 VITALS — Ht 66.0 in | Wt 216.0 lb

## 2021-08-03 DIAGNOSIS — M25561 Pain in right knee: Secondary | ICD-10-CM

## 2021-08-03 DIAGNOSIS — G8929 Other chronic pain: Secondary | ICD-10-CM

## 2021-08-03 NOTE — Patient Instructions (Signed)
?Knee Exercises ? ?Ask your health care provider which exercises are safe for you. Do exercises exactly as told by your health care provider and adjust them as directed. It is normal to feel mild stretching, pulling, tightness, or discomfort as you do these exercises. Stop right away if you feel sudden pain or your pain gets worse. Do not begin these exercises until told by your health care provider. ? ?Stretching and range-of-motion exercises ?These exercises warm up your muscles and joints and improve the movement and flexibility of your knee. These exercises also help to relieve pain and swelling. ? ?Knee extension, prone ?Lie on your abdomen (prone position) on a bed. ?Place your left / right knee just beyond the edge of the surface so your knee is not on the bed. You can put a towel under your left / right thigh just above your kneecap for comfort. ?Relax your leg muscles and allow gravity to straighten your knee (extension). You should feel a stretch behind your left / right knee. ?Hold this position for 10 seconds. ?Scoot up so your knee is supported between repetitions. ?Repeat 10 times. Complete this exercise 3-4 times per week. ?    ?Knee flexion, active ?Lie on your back with both legs straight. If this causes back discomfort, bend your left / right knee so your foot is flat on the floor. ?Slowly slide your left / right heel back toward your buttocks. Stop when you feel a gentle stretch in the front of your knee or thigh (flexion). ?Hold this position for 10 seconds. ?Slowly slide your left / right heel back to the starting position. ?Repeat 10 times. Complete this exercise 3-4 times per week. ?  ?   ?Quadriceps stretch, prone ?Lie on your abdomen on a firm surface, such as a bed or padded floor. ?Bend your left / right knee and hold your ankle. If you cannot reach your ankle or pant leg, loop a belt around your foot and grab the belt instead. ?Gently pull your heel toward your buttocks. Your knee should  not slide out to the side. You should feel a stretch in the front of your thigh and knee (quadriceps). ?Hold this position for 10 seconds. ?Repeat 10 times. Complete this exercise 3-4 times per week. ?  ?   ?Hamstring, supine ?Lie on your back (supine position). ?Loop a belt or towel over the ball of your left / right foot. The ball of your foot is on the walking surface, right under your toes. ?Straighten your left / right knee and slowly pull on the belt to raise your leg until you feel a gentle stretch behind your knee (hamstring). ?Do not let your knee bend while you do this. ?Keep your other leg flat on the floor. ?Hold this position for 10 seconds. ?Repeat 10 times. Complete this exercise 3-4 times per week. ? ? ?Strengthening exercises ?These exercises build strength and endurance in your knee. Endurance is the ability to use your muscles for a long time, even after they get tired. ? ?Quadriceps, isometric ?This exercise stretches the muscles in front of your thigh (quadriceps) without moving your knee joint (isometric). ?Lie on your back with your left / right leg extended and your other knee bent. Put a rolled towel or small pillow under your knee if told by your health care provider. ?Slowly tense the muscles in the front of your left / right thigh. You should see your kneecap slide up toward your hip or see increased dimpling  just above the knee. This motion will push the back of the knee toward the floor. ?For 10 seconds, hold the muscle as tight as you can without increasing your pain. ?Relax the muscles slowly and completely. ?Repeat 10 times. Complete this exercise 3-4 times per week. ?.  ?   ?Straight leg raises ?This exercise stretches the muscles in front of your thigh (quadriceps) and the muscles that move your hips (hip flexors). ?Lie on your back with your left / right leg extended and your other knee bent. ?Tense the muscles in the front of your left / right thigh. You should see your kneecap  slide up or see increased dimpling just above the knee. Your thigh may even shake a bit. ?Keep these muscles tight as you raise your leg 4-6 inches (10-15 cm) off the floor. Do not let your knee bend. ?Hold this position for 10 seconds. ?Keep these muscles tense as you lower your leg. ?Relax your muscles slowly and completely after each repetition. ?Repeat 10 times. Complete this exercise 3-4 times per week. ? ?Hamstring, isometric ?Lie on your back on a firm surface. ?Bend your left / right knee about 30 degrees. ?Dig your left / right heel into the surface as if you are trying to pull it toward your buttocks. Tighten the muscles in the back of your thighs (hamstring) to "dig" as hard as you can without increasing any pain. ?Hold this position for 10 seconds. ?Release the tension gradually and allow your muscles to relax completely for __________ seconds after each repetition. ?Repeat 10 times. Complete this exercise 3-4 times per week. ? ?Hamstring curls ?If told by your health care provider, do this exercise while wearing ankle weights. Begin with 5 lb weights. Then increase the weight by 1 lb (0.5 kg) increments. You can also use an exercise band ?Lie on your abdomen with your legs straight. ?Bend your left / right knee as far as you can without feeling pain. Keep your hips flat against the floor. ?Hold this position for 10 seconds. ?Slowly lower your leg to the starting position. ?Repeat 10 times. Complete this exercise 3-4 times per week. ?  ?   ?Squats ?This exercise strengthens the muscles in front of your thigh and knee (quadriceps). ?Stand in front of a table, with your feet and knees pointing straight ahead. You may rest your hands on the table for balance but not for support. ?Slowly bend your knees and lower your hips like you are going to sit in a chair. ?Keep your weight over your heels, not over your toes. ?Keep your lower legs upright so they are parallel with the table legs. ?Do not let your hips  go lower than your knees. ?Do not bend lower than told by your health care provider. ?If your knee pain increases, do not bend as low. ?Hold the squat position for 10 seconds. ?Slowly push with your legs to return to standing. Do not use your hands to pull yourself to standing. ?Repeat 10 times. Complete this exercise 3-4 times per week ?. ?    ?Wall slides ?This exercise strengthens the muscles in front of your thigh and knee (quadriceps). ?Lean your back against a smooth wall or door, and walk your feet out 18-24 inches (46-61 cm) from it. ?Place your feet hip-width apart. ?Slowly slide down the wall or door until your knees bend 90 degrees. Keep your knees over your heels, not over your toes. Keep your knees in line with your hips. ?Hold  this position for 10 seconds. ?Repeat 10 times. Complete this exercise 3-4 times per week. ?  ?   ?Straight leg raises ?This exercise strengthens the muscles that rotate the leg at the hip and move it away from your body (hip abductors). ?Lie on your side with your left / right leg in the top position. Lie so your head, shoulder, knee, and hip line up. You may bend your bottom knee to help you keep your balance. ?Roll your hips slightly forward so your hips are stacked directly over each other and your left / right knee is facing forward. ?Leading with your heel, lift your top leg 4-6 inches (10-15 cm). You should feel the muscles in your outer hip lifting. ?Do not let your foot drift forward. ?Do not let your knee roll toward the ceiling. ?Hold this position for 10 seconds. ?Slowly return your leg to the starting position. ?Let your muscles relax completely after each repetition. ?Repeat 10 times. Complete this exercise 3-4 times per week. ?  ?   ?Straight leg raises ?This exercise stretches the muscles that move your hips away from the front of the pelvis (hip extensors). ?Lie on your abdomen on a firm surface. You can put a pillow under your hips if that is more  comfortable. ?Tense the muscles in your buttocks and lift your left / right leg about 4-6 inches (10-15 cm). Keep your knee straight as you lift your leg. ?Hold this position for 10 seconds. ?Slowly lower your leg t

## 2021-08-04 ENCOUNTER — Encounter: Payer: Self-pay | Admitting: Orthopedic Surgery

## 2021-08-04 NOTE — Progress Notes (Signed)
Orthopaedic Clinic Return ? ?Assessment: ?Teresa Arellano is a 71 y.o. female with the following: ?Right knee arthritis ? ?Plan: ?Mrs. Fell has pain in her right knee.  Radiographs demonstrates loss of joint space, as well as progressing arthritis.  She is interested in an injection.  This was completed in clinic today.  I provided her with knee exercises.  Follow-up as needed. ? ?Procedure note injection Right knee joint ?  ?Verbal consent was obtained to inject the right knee joint  ?Timeout was completed to confirm the site of injection.  The skin was prepped with alcohol and ethyl chloride was sprayed at the injection site.  ?A 21-gauge needle was used to inject 40 mg of Depo-Medrol and 1% lidocaine (3 cc) into the right knee using an anterolateral approach.  ?There were no complications. A sterile bandage was applied. ? ? ?Follow-up: ?Return if symptoms worsen or fail to improve. ? ? ?Subjective: ? ?Chief Complaint  ?Patient presents with  ? Knee Pain  ?  Rt knee pain for a yr getting worse with painful crepitus.   ? ? ?History of Present Illness: ?Teresa Arellano is a 71 y.o. female who returns to clinic for evaluation of right knee pain.  She is well-known to our clinic, having undergone surgery on her right wrist.  She has had progressively worsening pain in the right knee.  She notes some popping and catching sensation.  It is painful to her.  Medications are not helping.  She has not had an injection.  No physical therapy exercises. ? ?Review of Systems: ?No fevers or chills ?No numbness or tingling ?No chest pain ?No shortness of breath ?No bowel or bladder dysfunction ?No GI distress ?No headaches ? ? ?Objective: ?Ht '5\' 6"'$  (1.676 m)   Wt 216 lb (98 kg)   BMI 34.86 kg/m?  ? ?Physical Exam: ? ?Alert and oriented.  No acute distress.  Mild effusion of the right knee.  Palpable and painful crepitus with range of motion testing.  There is a catching sensation deep to the patella.  No increased laxity to  varus or valgus stress.  Negative Lachman.  Sensation is intact distally.  Tenderness to palpation in the lateral joint line. ? ?IMAGING: ?I personally ordered and reviewed the following images: ? ?Trays of the right knee were obtained in clinic today.  Mild valgus alignment.  Near complete loss of joint space within the lateral compartment.  Loss of joint space within the patellofemoral compartment, with osteophytes both medial and lateral. ? ?Impression: Right knee arthritis, moderate to severe within the lateral and patellofemoral compartments ? ? ?Mordecai Rasmussen, MD ?08/04/2021 ?11:09 AM ? ? ?

## 2021-08-09 DIAGNOSIS — H01002 Unspecified blepharitis right lower eyelid: Secondary | ICD-10-CM | POA: Diagnosis not present

## 2021-08-09 DIAGNOSIS — H353132 Nonexudative age-related macular degeneration, bilateral, intermediate dry stage: Secondary | ICD-10-CM | POA: Diagnosis not present

## 2021-08-09 DIAGNOSIS — H01004 Unspecified blepharitis left upper eyelid: Secondary | ICD-10-CM | POA: Diagnosis not present

## 2021-08-09 DIAGNOSIS — H01001 Unspecified blepharitis right upper eyelid: Secondary | ICD-10-CM | POA: Diagnosis not present

## 2021-08-11 ENCOUNTER — Encounter: Payer: Self-pay | Admitting: Pulmonary Disease

## 2021-08-11 ENCOUNTER — Ambulatory Visit: Payer: Medicare HMO | Admitting: Pulmonary Disease

## 2021-08-11 VITALS — BP 132/90 | HR 88 | Temp 98.4°F | Ht 66.0 in | Wt 218.2 lb

## 2021-08-11 DIAGNOSIS — R0683 Snoring: Secondary | ICD-10-CM

## 2021-08-11 NOTE — Progress Notes (Signed)
? ?Subjective:  ? ? Patient ID: Teresa Arellano, female    DOB: [birth year], 71 y.o.   MRN: [id number] ? ?HPI ? ? ? ?Chief Complaint  ?Patient presents with  ? Consult  ?  Potential OSA ref by Dr. Moshe Cipro   ? 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 ?Her husband has noted her stop breathing in her sleep and he sometimes wakes her up.  He is also noted mild snoring. ?Epworth sleepiness score is 8 and she admits to falling asleep in her chair at times watching TV, as a passenger in a car or lying down to rest in the afternoon.  She will take a nap in the couch on weekends for about an hour and wakes up feeling refreshed. ?Bedtime is between 10 and 11 PM, sleep latency can be up to an hour, she sleeps [place] on her side with 2 pillows and occasionally rolls on her back, reports 2-3 nocturnal awakenings and is out of bed by 6:15 AM feeling rested without headaches with occasional dryness of mouth ?There is no history suggestive of cataplexy, sleep paralysis or parasomnias ?Weight is mostly unchanged over the past 5 years. ? ?Her workdays are long from 7 AM to 5 PM ? ? ? ? ?Past Medical History:  ?Diagnosis Date  ? Cataract   ? Hypertension 2014  ? ?Past Surgical History:  ?Procedure Laterality Date  ? ABDOMINAL HYSTERECTOMY  04/11/1980  ? partial, fibroids  ? CATARACT EXTRACTION W/PHACO Right 05/02/2016  ? Procedure: CATARACT EXTRACTION PHACO AND INTRAOCULAR LENS PLACEMENT (IOC);  Surgeon: Tonny Branch, MD;  Location: [facility];  Service: Ophthalmology;  Laterality: Right;  CDE: 5.35  ? CATARACT EXTRACTION W/PHACO Left 05/23/2016  ? Procedure: CATARACT EXTRACTION PHACO AND INTRAOCULAR LENS PLACEMENT LEFT EYE;  Surgeon: Tonny Branch, MD;  Location: [facility];  Service: Ophthalmology;  Laterality: Left;  CDE:  5.45  ? CHOLECYSTECTOMY  1991 approx  ? COLONOSCOPY N/A 04/06/2016  ? Procedure: COLONOSCOPY;  Surgeon: Danie Binder, MD;  Location: [facility];  Service: Endoscopy;   Laterality: N/A;  10:30 Am  ? OPEN REDUCTION INTERNAL FIXATION (ORIF) DISTAL RADIAL FRACTURE Right 12/31/2020  ? Procedure: OPEN REDUCTION INTERNAL FIXATION (ORIF) DISTAL RADIAL FRACTURE;  Surgeon: Mordecai Rasmussen, MD;  Location: [facility];  Service: Orthopedics;  Laterality: Right;  ? POLYPECTOMY  04/06/2016  ? Procedure: POLYPECTOMY;  Surgeon: Danie Binder, MD;  Location: [facility];  Service: Endoscopy;;  hepatic flexure polypectomy;  ? TUBAL LIGATION  04/11/1977  ? ?No Known Allergies ? ?Social History  ? ?Socioeconomic History  ? Marital status: Married  ?  Spouse name: Not on file  ? Number of children: Not on file  ? Years of education: college   ? Highest education level: Associate degree: academic program  ?Occupational History  ? Not on file  ?Tobacco Use  ? Smoking status: Never  ? Smokeless tobacco: Never  ?Substance and Sexual Activity  ? Alcohol use: No  ? Drug use: No  ? Sexual activity: Not Currently  ?  Birth control/protection: Surgical  ?Other Topics Concern  ? Not on file  ?Social History Narrative  ? Not on file  ? ?Social Determinants of Health  ? ?Financial Resource Strain: Low Risk   ? Difficulty of Paying Living Expenses: Not hard at all  ?Food Insecurity: No Food Insecurity  ? Worried About Charity fundraiser in the Last Year: Never true  ? Ran Out of  Food in the Last Year: Never true  ?Transportation Needs: No Transportation Needs  ? Lack of Transportation (Medical): No  ? Lack of Transportation (Non-Medical): No  ?Physical Activity: Insufficiently Active  ? Days of Exercise per Week: 5 days  ? Minutes of Exercise per Session: 20 min  ?Stress: No Stress Concern Present  ? Feeling of Stress : Not at all  ?Social Connections: Socially Integrated  ? Frequency of Communication with Friends and Family: More than three times a week  ? Frequency of Social Gatherings with Friends and Family: Once a week  ? Attends Religious Services: More than 4 times per year  ? Active Member of Clubs or  Organizations: Yes  ? Attends Archivist Meetings: Never  ? Marital Status: Married  ?Intimate Partner Violence: Not At Risk  ? Fear of Current or Ex-Partner: No  ? Emotionally Abused: No  ? Physically Abused: No  ? Sexually Abused: No  ? ? ?Family History  ?Problem Relation Age of Onset  ? Heart disease Mother   ? Diabetes Father   ? Heart disease Brother   ? Cancer Sister   ?     breast   ? Cancer Sister   ?     stomach   ? Heart disease Brother   ? Hypertension Sister   ? Hypertension Sister   ? Diabetes Sister   ? Diabetes Sister   ? ? ? ? ?Review of Systems ?Constitutional: negative for anorexia, fevers and sweats  ?Eyes: negative for irritation, redness and visual disturbance  ?Ears, nose, mouth, throat, and face: negative for earaches, epistaxis, nasal congestion and sore throat  ?Respiratory: negative for cough, dyspnea on exertion, sputum and wheezing  ?Cardiovascular: negative for chest pain, dyspnea, lower extremity edema, orthopnea, palpitations and syncope  ?Gastrointestinal: negative for abdominal pain, constipation, diarrhea, melena, nausea and vomiting  ?Genitourinary:negative for dysuria, frequency and hematuria  ?Hematologic/lymphatic: negative for bleeding, easy bruising and lymphadenopathy  ?Musculoskeletal:negative for arthralgias, muscle weakness and stiff joints  ?Neurological: negative for coordination problems, gait problems, headaches and weakness  ?Endocrine: negative for diabetic symptoms including polydipsia, polyuria and weight loss ? ?   ?Objective:  ? Physical Exam ? ?[place], obese, in no distress, normal affect ?ENT - no pallor,icterus, no post nasal drip, class 2 airway, 37m overbite ?Neck: No JVD, no thyromegaly, no carotid bruits ?Lungs: no use of accessory muscles, no dullness to percussion, decreased without rales or rhonchi  ?Cardiovascular: Rhythm regular, heart sounds  normal, no murmurs or gallops, no peripheral edema ?Abdomen: soft and non-tender, no  hepatosplenomegaly, BS normal. ?Musculoskeletal: No deformities, no cyanosis or clubbing ?Neuro:  alert, non focal, no tremors ? ? ? ?   ?Assessment & Plan:  ? ? ?

## 2021-08-11 NOTE — Assessment & Plan Note (Signed)
Due to the history of witnessed apneas, we should investigate with a home sleep test ?She does have mild daytime somnolence but this could be related to her long work days. ? ?Given excessive daytime somnolence, narrow pharyngeal exam, witnessed apneas & loud snoring, obstructive sleep apnea is possible & an overnight polysomnogram will be scheduled as a home study. The pathophysiology of obstructive sleep apnea , it's cardiovascular consequences & modes of treatment including CPAP were discused with the patient in detail & they evidenced understanding. ?Pretest probably it is intermediate ? ?

## 2021-08-11 NOTE — Patient Instructions (Signed)
?  X Home sleep test 

## 2021-11-23 ENCOUNTER — Other Ambulatory Visit: Payer: Self-pay | Admitting: Family Medicine

## 2021-11-28 ENCOUNTER — Other Ambulatory Visit: Payer: Self-pay | Admitting: Family Medicine

## 2021-12-06 DIAGNOSIS — Z8249 Family history of ischemic heart disease and other diseases of the circulatory system: Secondary | ICD-10-CM | POA: Diagnosis not present

## 2021-12-06 DIAGNOSIS — Z7983 Long term (current) use of bisphosphonates: Secondary | ICD-10-CM | POA: Diagnosis not present

## 2021-12-06 DIAGNOSIS — Z6834 Body mass index (BMI) 34.0-34.9, adult: Secondary | ICD-10-CM | POA: Diagnosis not present

## 2021-12-06 DIAGNOSIS — Z823 Family history of stroke: Secondary | ICD-10-CM | POA: Diagnosis not present

## 2021-12-06 DIAGNOSIS — M810 Age-related osteoporosis without current pathological fracture: Secondary | ICD-10-CM | POA: Diagnosis not present

## 2021-12-06 DIAGNOSIS — Z803 Family history of malignant neoplasm of breast: Secondary | ICD-10-CM | POA: Diagnosis not present

## 2021-12-06 DIAGNOSIS — I10 Essential (primary) hypertension: Secondary | ICD-10-CM | POA: Diagnosis not present

## 2021-12-06 DIAGNOSIS — Z833 Family history of diabetes mellitus: Secondary | ICD-10-CM | POA: Diagnosis not present

## 2021-12-06 DIAGNOSIS — E669 Obesity, unspecified: Secondary | ICD-10-CM | POA: Diagnosis not present

## 2021-12-07 ENCOUNTER — Ambulatory Visit: Payer: Medicare HMO

## 2021-12-07 DIAGNOSIS — G4733 Obstructive sleep apnea (adult) (pediatric): Secondary | ICD-10-CM

## 2021-12-07 DIAGNOSIS — R0683 Snoring: Secondary | ICD-10-CM

## 2021-12-09 ENCOUNTER — Telehealth: Payer: Self-pay | Admitting: Pulmonary Disease

## 2021-12-09 DIAGNOSIS — G4733 Obstructive sleep apnea (adult) (pediatric): Secondary | ICD-10-CM

## 2021-12-09 NOTE — Telephone Encounter (Signed)
HST showed severe  OSA with AHI 35/ hr & lowest sat 69%  Suggest autoCPAP  5-15 cm, mask of choice OV with me/[facility] in 6 wks after starting

## 2021-12-09 NOTE — Telephone Encounter (Signed)
Called and went over results and details with patient. She is agreeable to start CPAP. Explained to her importance of 6 week follow up after starting CPAP (insurance requirement and so provider can follow up on progress) and she voiced understanding. CPAP ordered. Nothing further needed at this time.

## 2021-12-22 ENCOUNTER — Telehealth: Payer: Self-pay | Admitting: Pulmonary Disease

## 2021-12-22 DIAGNOSIS — G4733 Obstructive sleep apnea (adult) (pediatric): Secondary | ICD-10-CM

## 2021-12-27 NOTE — Telephone Encounter (Signed)
New order placed for cpap machine and in order states to send to [facility]. Nothing further needed

## 2022-01-27 ENCOUNTER — Ambulatory Visit (INDEPENDENT_AMBULATORY_CARE_PROVIDER_SITE_OTHER): Payer: Medicare HMO | Admitting: Family Medicine

## 2022-01-27 ENCOUNTER — Encounter: Payer: Self-pay | Admitting: Family Medicine

## 2022-01-27 ENCOUNTER — Other Ambulatory Visit: Payer: Self-pay

## 2022-01-27 VITALS — BP 122/75 | HR 70 | Ht 66.0 in | Wt 213.1 lb

## 2022-01-27 DIAGNOSIS — M545 Low back pain, unspecified: Secondary | ICD-10-CM | POA: Diagnosis not present

## 2022-01-27 DIAGNOSIS — I10 Essential (primary) hypertension: Secondary | ICD-10-CM | POA: Diagnosis not present

## 2022-01-27 DIAGNOSIS — M8580 Other specified disorders of bone density and structure, unspecified site: Secondary | ICD-10-CM

## 2022-01-27 DIAGNOSIS — Z13220 Encounter for screening for lipoid disorders: Secondary | ICD-10-CM

## 2022-01-27 DIAGNOSIS — Z1231 Encounter for screening mammogram for malignant neoplasm of breast: Secondary | ICD-10-CM

## 2022-01-27 DIAGNOSIS — Z23 Encounter for immunization: Secondary | ICD-10-CM | POA: Diagnosis not present

## 2022-01-27 DIAGNOSIS — E559 Vitamin D deficiency, unspecified: Secondary | ICD-10-CM

## 2022-01-27 MED ORDER — TRIAMTERENE-HCTZ 37.5-25 MG PO TABS: ORAL_TABLET | ORAL | 3 refills | Status: DC

## 2022-01-27 MED ORDER — METHOCARBAMOL 500 MG PO TABS: ORAL_TABLET | ORAL | 0 refills | Status: DC

## 2022-01-27 MED ORDER — ALENDRONATE SODIUM 70 MG PO TABS: 70.0000 mg | ORAL_TABLET | ORAL | 11 refills | Status: DC

## 2022-01-27 MED ORDER — NAPROXEN 500 MG PO TABS: 500.0000 mg | ORAL_TABLET | Freq: Two times a day (BID) | ORAL | 0 refills | Status: DC

## 2022-01-27 NOTE — Progress Notes (Signed)
Teresa Arellano     MRN: [id number]      DOB: [birth year]   HPI Teresa Arellano is here for follow up and re-evaluation of chronic medical conditions, medication management and review of any available recent lab and radiology data.  Preventive health is updated, specifically  Cancer screening and Immunization.   Questions or concerns regarding consultations or procedures which the PT has had in the interim are  addressed. The PT denies any adverse reactions to current medications since the last visit.  2 day h/o severe back pain   ROS Denies recent fever or chills. Denies sinus pressure, nasal congestion, ear pain or sore throat. Denies chest congestion, productive cough or wheezing. Denies chest pains, palpitations and leg swelling Denies abdominal pain, nausea, vomiting,diarrhea or constipation.   Denies dysuria, frequency, hesitancy or incontinence. . Denies headaches, seizures, numbness, or tingling. Denies depression, anxiety or insomnia. Denies skin break down or rash.   PE  BP 122/75 (BP Location: Right Arm, Patient Position: Sitting, Cuff Size: Large)   Pulse 70   Ht '5\' 6"'$  (1.676 m)   Wt 213 lb 1.3 oz (96.7 kg)   SpO2 98%   BMI 34.39 kg/m   Patient alert and oriented and in no cardiopulmonary distress.  HEENT: No facial asymmetry, EOMI,     Neck supple .  Chest: Clear to auscultation bilaterally.  CVS: S1, S2 no murmurs, no S3.Regular rate.  ABD: Soft non tender.   Ext: No edema  MS: decreased  ROM lumbar spine, adequate in shoulders, hips and knees.  Skin: Intact, no ulcerations or rash noted.  Psych: Good eye contact, normal affect. Memory intact not anxious or depressed appearing.  CNS: CN 2-12 intact, power,  normal throughout.no focal deficits noted.   Assessment & Plan  Back pain Naproxen and robaxin prescribed short term  Essential hypertension, benign Controlled, no change in medication DASH diet and commitment to daily physical activity for a  minimum of 30 minutes discussed and encouraged, as a part of hypertension management. The importance of attaining a healthy weight is also discussed.     01/27/2022   11:19 AM 08/11/2021   10:49 AM 08/03/2021    1:27 PM 07/28/2021   11:37 AM 07/28/2021   11:16 AM 06/03/2021    2:09 PM 04/13/2021    9:41 AM  BP/Weight  Systolic BP 580 998  338 250 539   Diastolic BP 75 90  82 83 83   Wt. (Lbs) 213.08 218.2 216  216 221 214  BMI 34.39 kg/m2 35.22 kg/m2 34.86 kg/m2  34.86 kg/m2 35.67 kg/m2 34.54 kg/m2       Morbid obesity (HCC)  Patient re-educated about  the importance of commitment to a  minimum of 150 minutes of exercise per week as able.  The importance of healthy food choices with portion control discussed, as well as eating regularly and within a 12 hour window most days. The need to choose "clean , [place]" food 50 to 75% of the time is discussed, as well as to make water the primary drink and set a goal of 64 ounces water daily.       01/27/2022   11:19 AM 08/11/2021   10:49 AM 08/03/2021    1:27 PM  Weight /BMI  Weight 213 lb 1.3 oz 218 lb 3.2 oz 216 lb  Height '5\' 6"'$  (1.676 m) '5\' 6"'$  (1.676 m) '5\' 6"'$  (1.676 m)  BMI 34.39 kg/m2 35.22 kg/m2 34.86 kg/m2

## 2022-01-27 NOTE — Patient Instructions (Addendum)
Keep appt already scheduled, call if you need me sooner  Flu vaccine today,  Please get fasting cBC, lipid, cmp  and EGFR, tSH and Vit D 5 days before next appointment  Please get current covid vaccine in next 1 to 2 weeks   Mammogram and dexa, Please sched jan 10 or after at [facility] at checkout  Need shingrix #2 first week I December or shortly after  Nurse please document shingrix and RSV

## 2022-01-30 ENCOUNTER — Encounter: Payer: Self-pay | Admitting: Family Medicine

## 2022-01-30 DIAGNOSIS — M549 Dorsalgia, unspecified: Secondary | ICD-10-CM | POA: Insufficient documentation

## 2022-01-30 NOTE — Assessment & Plan Note (Signed)
  Patient re-educated about  the importance of commitment to a  minimum of 150 minutes of exercise per week as able.  The importance of healthy food choices with portion control discussed, as well as eating regularly and within a 12 hour window most days. The need to choose "clean , [place]" food 50 to 75% of the time is discussed, as well as to make water the primary drink and set a goal of 64 ounces water daily.       01/27/2022   11:19 AM 08/11/2021   10:49 AM 08/03/2021    1:27 PM  Weight /BMI  Weight 213 lb 1.3 oz 218 lb 3.2 oz 216 lb  Height '5\' 6"'$  (1.676 m) '5\' 6"'$  (1.676 m) '5\' 6"'$  (1.676 m)  BMI 34.39 kg/m2 35.22 kg/m2 34.86 kg/m2

## 2022-01-30 NOTE — Assessment & Plan Note (Signed)
Naproxen and robaxin prescribed short term

## 2022-01-30 NOTE — Assessment & Plan Note (Signed)
Controlled, no change in medication DASH diet and commitment to daily physical activity for a minimum of 30 minutes discussed and encouraged, as a part of hypertension management. The importance of attaining a healthy weight is also discussed.     01/27/2022   11:19 AM 08/11/2021   10:49 AM 08/03/2021    1:27 PM 07/28/2021   11:37 AM 07/28/2021   11:16 AM 06/03/2021    2:09 PM 04/13/2021    9:41 AM  BP/Weight  Systolic BP 013 143  888 757 972   Diastolic BP 75 90  82 83 83   Wt. (Lbs) 213.08 218.2 216  216 221 214  BMI 34.39 kg/m2 35.22 kg/m2 34.86 kg/m2  34.86 kg/m2 35.67 kg/m2 34.54 kg/m2

## 2022-02-12 ENCOUNTER — Other Ambulatory Visit: Payer: Self-pay | Admitting: Family Medicine

## 2022-02-22 ENCOUNTER — Ambulatory Visit (INDEPENDENT_AMBULATORY_CARE_PROVIDER_SITE_OTHER): Payer: Medicare HMO

## 2022-02-22 DIAGNOSIS — Z0000 Encounter for general adult medical examination without abnormal findings: Secondary | ICD-10-CM | POA: Diagnosis not present

## 2022-02-22 NOTE — Patient Instructions (Signed)
  Ms. Osment , Thank you for taking time to come for your Medicare Wellness Visit. I appreciate your ongoing commitment to your health goals. Please review the following plan we discussed and let me know if I can assist you in the future.   These are the goals we discussed:  Goals      Patient Stated     Lose weight.     Patient Stated     Patient states that her goal is to live a healthy life.     Set My Weight Loss Goal     "I would like to lose 30lbs"         This is a list of the screening recommended for you and due dates:  Health Maintenance  Topic Date Due   COVID-19 Vaccine (5 - Pfizer series) 05/10/2021   Zoster (Shingles) Vaccine (2 of 2) 02/16/2022   Medicare Annual Wellness Visit  02/23/2023   Mammogram  04/20/2023   Tetanus Vaccine  09/11/2023   Colon Cancer Screening  04/06/2026   Pneumonia Vaccine  Completed   Flu Shot  Completed   DEXA scan (bone density measurement)  Completed   Hepatitis C Screening: USPSTF Recommendation to screen - Ages 3-79 yo.  Completed   HPV Vaccine  Aged Out

## 2022-02-22 NOTE — Progress Notes (Signed)
Subjective:   Teresa Arellano is a 71 y.o. female who presents for Medicare Annual (Subsequent) preventive examination. I connected with  Teresa Arellano on 02/22/22 by a audio enabled telemedicine application and verified that I am speaking with the correct person using two identifiers.  Patient Location: Home  Provider Location: Office/Clinic  I discussed the limitations of evaluation and management by telemedicine. The patient expressed understanding and agreed to proceed.  Review of Systems           Objective:    There were no vitals filed for this visit. There is no height or weight on file to calculate BMI.     02/08/2021    2:17 PM 12/31/2020    6:27 AM 12/29/2020    1:57 PM 12/16/2020    8:08 PM 02/03/2020    2:08 PM 12/25/2017   10:46 AM 08/24/2016    7:09 PM  Advanced Directives  Does Patient Have a Medical Advance Directive? No Yes No No No No No  Would patient like information on creating a medical advance directive? Yes (MAU/Ambulatory/Procedural Areas - Information given) No - Patient declined No - Patient declined  No - Patient declined Yes (ED - Information included in AVS)     Current Medications (verified) Outpatient Encounter Medications as of 02/22/2022  Medication Sig   alendronate (FOSAMAX) 70 MG tablet Take 1 tablet (70 mg total) by mouth every 7 (seven) days. Take with a full glass of water on an empty stomach.   CALCIUM PO Take 1 tablet by mouth daily after breakfast.   Cholecalciferol (VITAMIN D3) 50 MCG (2000 UT) TABS Take 2,000 Units by mouth in the morning.   methocarbamol (ROBAXIN) 500 MG tablet Take one tablet at bedtime for back pain and spasm, as needed   naproxen (NAPROSYN) 500 MG tablet Take 1 tablet (500 mg total) by mouth 2 (two) times daily with a meal.   PFIZER COVID-19 VAC BIVALENT injection    potassium chloride (KLOR-CON) 10 MEQ tablet Take 1 tablet by mouth once daily   triamterene-hydrochlorothiazide (MAXZIDE-25) 37.5-25 MG  tablet Take one and a half tablets by mouth once daily for blood pressure   No facility-administered encounter medications on file as of 02/22/2022.    Allergies (verified) Patient has no known allergies.   History: Past Medical History:  Diagnosis Date   Cataract    Hypertension 2014   Past Surgical History:  Procedure Laterality Date   ABDOMINAL HYSTERECTOMY  04/11/1980   partial, fibroids   CATARACT EXTRACTION W/PHACO Right 05/02/2016   Procedure: CATARACT EXTRACTION PHACO AND INTRAOCULAR LENS PLACEMENT (IOC);  Surgeon: Tonny Branch, MD;  Location: [facility];  Service: Ophthalmology;  Laterality: Right;  CDE: 5.35   CATARACT EXTRACTION W/PHACO Left 05/23/2016   Procedure: CATARACT EXTRACTION PHACO AND INTRAOCULAR LENS PLACEMENT LEFT EYE;  Surgeon: Tonny Branch, MD;  Location: [facility];  Service: Ophthalmology;  Laterality: Left;  CDE:  5.45   CHOLECYSTECTOMY  1991 approx   COLONOSCOPY N/A 04/06/2016   Procedure: COLONOSCOPY;  Surgeon: Danie Binder, MD;  Location: [facility];  Service: Endoscopy;  Laterality: N/A;  10:30 Am   OPEN REDUCTION INTERNAL FIXATION (ORIF) DISTAL RADIAL FRACTURE Right 12/31/2020   Procedure: OPEN REDUCTION INTERNAL FIXATION (ORIF) DISTAL RADIAL FRACTURE;  Surgeon: Mordecai Rasmussen, MD;  Location: [facility];  Service: Orthopedics;  Laterality: Right;   POLYPECTOMY  04/06/2016   Procedure: POLYPECTOMY;  Surgeon: Danie Binder, MD;  Location: [facility];  Service:  Endoscopy;;  hepatic flexure polypectomy;   TUBAL LIGATION  04/11/1977   Family History  Problem Relation Age of Onset   Heart disease Mother    Diabetes Father    Heart disease Brother    Cancer Sister        breast    Cancer Sister        stomach    Heart disease Brother    Hypertension Sister    Hypertension Sister    Diabetes Sister    Diabetes Sister    Social History   Socioeconomic History   Marital status: Married    Spouse name: Not on file   Number of children: Not on file    Years of education: college    Highest education level: Associate degree: academic program  Occupational History   Not on file  Tobacco Use   Smoking status: Never   Smokeless tobacco: Never  Substance and Sexual Activity   Alcohol use: No   Drug use: No   Sexual activity: Not Currently    Birth control/protection: Surgical  Other Topics Concern   Not on file  Social History Narrative   Not on file   Social Determinants of Health   Financial Resource Strain: Low Risk  (02/08/2021)   Overall Financial Resource Strain (CARDIA)    Difficulty of Paying Living Expenses: Not hard at all  Food Insecurity: No Food Insecurity (02/08/2021)   Hunger Vital Sign    Worried About Running Out of Food in the Last Year: Never true    [place] in the Last Year: Never true  Transportation Needs: No Transportation Needs (02/08/2021)   PRAPARE - Hydrologist (Medical): No    Lack of Transportation (Non-Medical): No  Physical Activity: Insufficiently Active (02/08/2021)   Exercise Vital Sign    Days of Exercise per Week: 5 days    Minutes of Exercise per Session: 20 min  Stress: No Stress Concern Present (02/08/2021)   [place]    Feeling of Stress : Not at all  Social Connections: [place] (02/08/2021)   Social Connection and Isolation Panel [NHANES]    Frequency of Communication with Friends and Family: More than three times a week    Frequency of Social Gatherings with Friends and Family: Once a week    Attends Religious Services: More than 4 times per year    Active Member of [employer] or Organizations: Yes    Attends Archivist Meetings: Never    Marital Status: Married    Tobacco Counseling Counseling given: Not Answered   Clinical Intake:                 Diabetic?no         Activities of Daily Living     No data to display           Patient Care Team: Fayrene Helper, MD as PCP - General (Family Medicine)  Indicate any recent Medical Services you may have received from other than [facility] providers in the past year (date may be approximate).     Assessment:   This is a routine wellness examination for Alexandrina.  Hearing/Vision screen No results found.  Dietary issues and exercise activities discussed:     Goals Addressed   None    Depression Screen    01/27/2022   11:20 AM 07/28/2021   11:16 AM 02/08/2021    2:18  PM 02/08/2021    2:15 PM 04/27/2020    2:04 PM 02/03/2020    2:09 PM 02/03/2020    2:06 PM  PHQ 2/9 Scores  PHQ - 2 Score 0 0 0 0 0 0 0    Fall Risk    01/27/2022   11:20 AM 07/28/2021   11:16 AM 06/03/2021    2:12 PM 02/08/2021    2:17 PM 02/07/2021    7:35 PM  Fall Risk   Falls in the past year? 0 0 '1 1 1  '$ Number falls in past yr: 0 0 0 0 0  Injury with Fall? 0 0 0 1 1  Risk for fall due to : No Fall Risks No Fall Risks     Follow up Falls evaluation completed Falls evaluation completed  Falls evaluation completed     [place]:  Any stairs in or around the home? Yes  If so, are there any without handrails? No  Home free of loose throw rugs in walkways, pet beds, electrical cords, etc? Yes  Adequate lighting in your home to reduce risk of falls? Yes   ASSISTIVE DEVICES UTILIZED TO PREVENT FALLS:  Life alert? No  Use of a cane, walker or w/c? No  Grab bars in the bathroom? No  Shower chair or bench in shower? No  Elevated toilet seat or a handicapped toilet? No   TIMED UP AND GO:  Was the test performed? No .  Length of time to ambulate 10 feet:  sec.     Cognitive Function:    02/08/2021    2:18 PM  MMSE - Mini Mental State Exam  Not completed: Unable to complete        02/08/2021    2:18 PM 02/03/2020    2:12 PM 12/27/2018    1:46 PM 12/25/2017   10:47 AM  6CIT Screen  What Year? 0 points 0 points 0 points 0 points   What month? 0 points 0 points 0 points 0 points  What time? 0 points 0 points 0 points 0 points  Count back from 20 0 points 0 points 0 points   Months in reverse 0 points 0 points 0 points   Repeat phrase 0 points 0 points 0 points   Total Score 0 points 0 points 0 points     Immunizations Immunization History  Administered Date(s) Administered   Fluad Quad(high Dose 65+) 12/24/2018, 02/18/2020, 02/10/2021, 01/27/2022   Influenza,inj,Quad PF,6+ Mos 01/09/2014, 11/26/2015, 05/18/2017, 12/29/2017   PFIZER(Purple Top)SARS-COV-2 Vaccination 05/02/2019, 05/23/2019, 03/27/2020, 03/15/2021   Pneumococcal Conjugate-13 05/17/2016   Pneumococcal Polysaccharide-23 05/18/2017   Rsv, Bivalent, Protein Subunit Rsvpref,pf Evans Lance) 12/22/2021   Tdap 09/10/2013   Zoster Recombinat (Shingrix) 12/22/2021   Zoster, Live 10/20/2014    TDAP status: Up to date  Flu Vaccine status: Up to date  Pneumococcal vaccine status: Up to date  Covid-19 vaccine status: Information provided on how to obtain vaccines.   Qualifies for Shingles Vaccine? Yes   Zostavax completed No   Shingrix Completed?: No.    Education has been provided regarding the importance of this vaccine. Patient has been advised to call insurance company to determine out of pocket expense if they have not yet received this vaccine. Advised may also receive vaccine at local pharmacy or Health Dept. Verbalized acceptance and understanding.  Screening Tests Health Maintenance  Topic Date Due   COVID-19 Vaccine (5 - Pfizer series) 05/10/2021   Medicare Annual Wellness (AWV)  02/08/2022   Zoster Vaccines- Shingrix (2 of 2) 02/16/2022   MAMMOGRAM  04/20/2023   TETANUS/TDAP  09/11/2023   COLONOSCOPY (Pts 45-57yr Insurance coverage will need to be confirmed)  04/06/2026   Pneumonia Vaccine 71 Years old  Completed   INFLUENZA VACCINE  Completed   DEXA SCAN  Completed   Hepatitis C Screening  Completed   HPV VACCINES  Aged Out     Health Maintenance  Health Maintenance Due  Topic Date Due   COVID-19 Vaccine (5 - PFort [place]eries) 05/10/2021   Medicare Annual Wellness (AWV)  02/08/2022   Zoster Vaccines- Shingrix (2 of 2) 02/16/2022    Colorectal cancer screening: Type of screening: Colonoscopy. Completed 04/06/16. Repeat every 10 years  Mammogram status: Completed 04/19/21. Repeat every year  Bone Density status: Completed 11/05/19. Results reflect: Bone density results: OSTEOPENIA. Repeat every   years.  Lung Cancer Screening: (Low Dose CT Chest recommended if Age 71-80years, 30 pack-year currently smoking OR have quit w/in 15years.) does not qualify.   Lung Cancer Screening Referral:   Additional Screening:  Hepatitis C Screening: does not qualify; Completed 11/26/15  Vision Screening: Recommended annual ophthalmology exams for early detection of glaucoma and other disorders of the eye. Is the patient up to date with their annual eye exam?  Yes  Who is the provider or what is the name of the office in which the patient attends annual eye exams? [facility] If pt is not established with a provider, would they like to be referred to a provider to establish care? No .   Dental Screening: Recommended annual dental exams for proper oral hygiene  Community Resource Referral / Chronic Care Management: CRR required this visit?  No   CCM required this visit?  No      Plan:     I have personally reviewed and noted the following in the patient's chart:   Medical and social history Use of alcohol, tobacco or illicit drugs  Current medications and supplements including opioid prescriptions. Patient is not currently taking opioid prescriptions. Functional ability and status Nutritional status Physical activity Advanced directives List of other physicians Hospitalizations, surgeries, and ER visits in previous 12 months Vitals Screenings to include cognitive, depression, and falls Referrals and  appointments  In addition, I have reviewed and discussed with patient certain preventive protocols, quality metrics, and best practice recommendations. A written personalized care plan for preventive services as well as general preventive health recommendations were provided to patient.     JJill Side CSan Jose  02/22/2022   Nurse Notes:

## 2022-05-12 ENCOUNTER — Telehealth: Payer: Self-pay | Admitting: Family Medicine

## 2022-05-12 NOTE — Telephone Encounter (Signed)
Roman w/a prescription company called and is checking on prescription forms for this patient.  His fax # [phone], phone # [phone]

## 2022-05-12 NOTE — Telephone Encounter (Signed)
No forms received no answer at phone number left

## 2022-05-16 ENCOUNTER — Telehealth: Payer: Self-pay | Admitting: Family Medicine

## 2022-05-16 NOTE — Telephone Encounter (Signed)
Deliver my meds will refax the forms.

## 2022-05-16 NOTE — Telephone Encounter (Signed)
Roman called from [facility] [phone] faxed over some paperwork and have not yet received paperwork back.  Needs to be signed and fax back to [phone] or [phone].  This is the 2nd time calling our office.

## 2022-05-30 ENCOUNTER — Other Ambulatory Visit (HOSPITAL_COMMUNITY): Payer: Self-pay | Admitting: Family Medicine

## 2022-05-30 DIAGNOSIS — Z1231 Encounter for screening mammogram for malignant neoplasm of breast: Secondary | ICD-10-CM

## 2022-06-08 ENCOUNTER — Ambulatory Visit (HOSPITAL_COMMUNITY)
Admission: RE | Admit: 2022-06-08 | Discharge: 2022-06-08 | Disposition: A | Discharge status: Home / Self Care | Payer: Medicare HMO | Source: Ambulatory Visit | Attending: Family Medicine | Admitting: Family Medicine

## 2022-06-08 ENCOUNTER — Encounter: Payer: Medicare HMO | Admitting: Family Medicine

## 2022-06-08 ENCOUNTER — Encounter (HOSPITAL_COMMUNITY): Payer: Self-pay

## 2022-06-08 DIAGNOSIS — Z1231 Encounter for screening mammogram for malignant neoplasm of breast: Secondary | ICD-10-CM | POA: Diagnosis not present

## 2022-06-08 DIAGNOSIS — M8580 Other specified disorders of bone density and structure, unspecified site: Secondary | ICD-10-CM | POA: Diagnosis not present

## 2022-06-08 DIAGNOSIS — M8589 Other specified disorders of bone density and structure, multiple sites: Secondary | ICD-10-CM | POA: Diagnosis not present

## 2022-06-08 DIAGNOSIS — Z780 Asymptomatic menopausal state: Secondary | ICD-10-CM | POA: Diagnosis not present

## 2022-06-09 ENCOUNTER — Encounter: Payer: Self-pay | Admitting: Radiology

## 2022-07-15 ENCOUNTER — Ambulatory Visit (INDEPENDENT_AMBULATORY_CARE_PROVIDER_SITE_OTHER): Payer: Medicare HMO | Admitting: Family Medicine

## 2022-07-15 ENCOUNTER — Encounter: Payer: Self-pay | Admitting: Family Medicine

## 2022-07-15 VITALS — BP 130/78 | HR 76 | Ht 66.0 in | Wt 214.0 lb

## 2022-07-15 DIAGNOSIS — R195 Other fecal abnormalities: Secondary | ICD-10-CM

## 2022-07-15 DIAGNOSIS — Z1211 Encounter for screening for malignant neoplasm of colon: Secondary | ICD-10-CM | POA: Diagnosis not present

## 2022-07-15 DIAGNOSIS — E669 Obesity, unspecified: Secondary | ICD-10-CM | POA: Diagnosis not present

## 2022-07-15 DIAGNOSIS — Z0001 Encounter for general adult medical examination with abnormal findings: Secondary | ICD-10-CM | POA: Diagnosis not present

## 2022-07-15 NOTE — Patient Instructions (Addendum)
F/U in 10 to 12 weeks, re evaluate  weight , call if you  need me sooner  Work on food choice and weight loss goal of 6 to 8 pounds  Only drink water   Stop eating at 7   Needs RSV and covid vaccines  Trade sweets and white foods, and chips and snacks for vegetables and fruit fresh or frozen   It is important that you exercise regularly at least 30 minutes 5 times a week. If you develop chest pain, have severe difficulty breathing, or feel very tired, stop exercising immediately and seek medical attention    Fasting labs past due  Nurse pls  set up cologuard  Thanks for choosing [facility], we consider it a privelige to serve you.

## 2022-07-15 NOTE — Assessment & Plan Note (Signed)
  Patient re-educated about  the importance of commitment to a  minimum of 150 minutes of exercise per week as able.  The importance of healthy food choices with portion control discussed, as well as eating regularly and within a 12 hour window most days. The need to choose "clean , [place]" food 50 to 75% of the time is discussed, as well as to make water the primary drink and set a goal of 64 ounces water daily.       07/15/2022   11:15 AM 01/27/2022   11:19 AM 08/11/2021   10:49 AM  Weight /BMI  Weight 214 lb 0.6 oz 213 lb 1.3 oz 218 lb 3.2 oz  Height 5\' 6"  (1.676 m) 5\' 6"  (1.676 m) 5\' 6"  (1.676 m)  BMI 34.55 kg/m2 34.39 kg/m2 35.22 kg/m2

## 2022-07-15 NOTE — Assessment & Plan Note (Signed)

## 2022-07-15 NOTE — Progress Notes (Signed)
    Teresa Arellano     MRN: [id number]      DOB: [birth year]  HPI: Patient is in for annual physical exam. No other health concerns are expressed or addressed at the visit. Recent labs,  are reviewed. Immunization is reviewed , and  updated if needed.   PE: BP 130/78 (BP Location: Right Arm, Patient Position: Sitting, Cuff Size: Large)   Pulse 76   Ht 5\' 6"  (1.676 m)   Wt 214 lb 0.6 oz (97.1 kg)   SpO2 97%   BMI 34.55 kg/m   Pleasant  female, alert and oriented x 3, in no cardio-pulmonary distress. Afebrile. HEENT No facial trauma or asymetry. Sinuses non tender.  Extra occullar muscles intact.. External ears normal, . Neck: supple, no adenopathy,JVD or thyromegaly.No bruits.  Chest: Clear to ascultation bilaterally.No crackles or wheezes. Non tender to palpation    Cardiovascular system; Heart sounds normal,  S1 and  S2 ,no S3.  No murmur, or thrill. Apical beat not displaced Peripheral pulses normal.  Abdomen: Soft, non tender    Musculoskeletal exam: Full ROM of spine, hips , shoulders and knees. No deformity ,swelling or crepitus noted. No muscle wasting or atrophy.   Neurologic: Cranial nerves 2 to 12 intact. Power, tone ,sensation and reflexes normal throughout. No disturbance in gait. No tremor.  Skin: Intact, no ulceration, erythema , scaling or rash noted. Pigmentation normal throughout  Psych; Normal mood and affect. Judgement and concentration normal   Assessment & Plan:  Annual visit for general adult medical examination with abnormal findings Annual exam as documented. Counseling done  re healthy lifestyle involving commitment to 150 minutes exercise per week, heart healthy diet, and attaining healthy weight.The importance of adequate sleep also discussed. Regular seat belt use and home safety, is also discussed. Changes in health habits are decided on by the patient with goals and time frames  set for achieving them. Immunization and  cancer screening needs are specifically addressed at this visit.   Obesity (BMI 30.0-34.9)  Patient re-educated about  the importance of commitment to a  minimum of 150 minutes of exercise per week as able.  The importance of healthy food choices with portion control discussed, as well as eating regularly and within a 12 hour window most days. The need to choose "clean , [place]" food 50 to 75% of the time is discussed, as well as to make water the primary drink and set a goal of 64 ounces water daily.       07/15/2022   11:15 AM 01/27/2022   11:19 AM 08/11/2021   10:49 AM  Weight /BMI  Weight 214 lb 0.6 oz 213 lb 1.3 oz 218 lb 3.2 oz  Height 5\' 6"  (1.676 m) 5\' 6"  (1.676 m) 5\' 6"  (1.676 m)  BMI 34.55 kg/m2 34.39 kg/m2 35.22 kg/m2

## 2022-07-20 DIAGNOSIS — I10 Essential (primary) hypertension: Secondary | ICD-10-CM | POA: Diagnosis not present

## 2022-07-20 DIAGNOSIS — Z13220 Encounter for screening for lipoid disorders: Secondary | ICD-10-CM | POA: Diagnosis not present

## 2022-07-20 DIAGNOSIS — E559 Vitamin D deficiency, unspecified: Secondary | ICD-10-CM | POA: Diagnosis not present

## 2022-07-21 LAB — TSH: TSH: 0.778 u[IU]/mL (ref 0.450–4.500)

## 2022-07-21 LAB — CMP14+EGFR
ALT: 19 IU/L (ref 0–32)
AST: 24 IU/L (ref 0–40)
Albumin/Globulin Ratio: 1.3 (ref 1.2–2.2)
Albumin: 4.3 g/dL (ref 3.8–4.8)
Alkaline Phosphatase: 67 IU/L (ref 44–121)
BUN/Creatinine Ratio: 23 (ref 12–28)
BUN: 22 mg/dL (ref 8–27)
Bilirubin Total: 0.6 mg/dL (ref 0.0–1.2)
CO2: 24 mmol/L (ref 20–29)
Calcium: 10.3 mg/dL (ref 8.7–10.3)
Chloride: 100 mmol/L (ref 96–106)
Creatinine, Ser: 0.95 mg/dL (ref 0.57–1.00)
Globulin, Total: 3.4 g/dL (ref 1.5–4.5)
Glucose: 94 mg/dL (ref 70–99)
Potassium: 4.3 mmol/L (ref 3.5–5.2)
Sodium: 139 mmol/L (ref 134–144)
Total Protein: 7.7 g/dL (ref 6.0–8.5)
eGFR: 64 mL/min/{1.73_m2} (ref >59)

## 2022-07-21 LAB — LIPID PANEL
Chol/HDL Ratio: 2.5 ratio (ref 0.0–4.4)
Cholesterol, Total: 163 mg/dL (ref 100–199)
HDL: 65 mg/dL (ref >39)
LDL Chol Calc (NIH): 86 mg/dL (ref 0–99)
Triglycerides: 62 mg/dL (ref 0–149)
VLDL Cholesterol Cal: 12 mg/dL (ref 5–40)

## 2022-07-21 LAB — CBC
Hematocrit: 41.2 % (ref 34.0–46.6)
Hemoglobin: 13.3 g/dL (ref 11.1–15.9)
MCH: 26.5 pg — ABNORMAL LOW (ref 26.6–33.0)
MCHC: 32.3 g/dL (ref 31.5–35.7)
MCV: 82 fL (ref 79–97)
Platelets: 325 10*3/uL (ref 150–450)
RBC: 5.02 x10E6/uL (ref 3.77–5.28)
RDW: 13.5 % (ref 11.7–15.4)
WBC: 8 10*3/uL (ref 3.4–10.8)

## 2022-07-21 LAB — VITAMIN D 25 HYDROXY (VIT D DEFICIENCY, FRACTURES): Vit D, 25-Hydroxy: 36.2 ng/mL (ref 30.0–100.0)

## 2022-07-25 DIAGNOSIS — Z1211 Encounter for screening for malignant neoplasm of colon: Secondary | ICD-10-CM | POA: Diagnosis not present

## 2022-07-31 LAB — COLOGUARD: COLOGUARD: POSITIVE — AB

## 2022-08-01 NOTE — Addendum Note (Signed)
Addended by: Kerri Perches on: 08/01/2022 12:58 AM   Modules accepted: Orders

## 2022-08-07 NOTE — Progress Notes (Deleted)
Referring Provider:Simpson, Milus Mallick, MD Primary Care Physician:  Kerri Perches, MD Primary Gastroenterologist:  Dr. Marletta Lor  No chief complaint on file.   HPI:   Teresa Arellano is a 72 y.o. female presenting today at the request of Kerri Perches, MD for positive Cologuard.  Last colonoscopy 04/06/2016 with 1 4 mm polyp in the hepatic flexure removed, internal hemorrhoids.  Polyp was resected, but not retrieved.  Recommended repeat colonoscopy in 5-10 years.  Cologuard was positive 07/25/2022  Past Medical History:  Diagnosis Date   Cataract    Hypertension 2014    Past Surgical History:  Procedure Laterality Date   ABDOMINAL HYSTERECTOMY  04/11/1980   partial, fibroids   CATARACT EXTRACTION W/PHACO Right 05/02/2016   Procedure: CATARACT EXTRACTION PHACO AND INTRAOCULAR LENS PLACEMENT (IOC);  Surgeon: Gemma Payor, MD;  Location: [facility];  Service: Ophthalmology;  Laterality: Right;  CDE: 5.35   CATARACT EXTRACTION W/PHACO Left 05/23/2016   Procedure: CATARACT EXTRACTION PHACO AND INTRAOCULAR LENS PLACEMENT LEFT EYE;  Surgeon: Gemma Payor, MD;  Location: [facility];  Service: Ophthalmology;  Laterality: Left;  CDE:  5.45   CHOLECYSTECTOMY  1991 approx   COLONOSCOPY N/A 04/06/2016   Procedure: COLONOSCOPY;  Surgeon: West Bali, MD;  [facility]: [facility];  Service: Endoscopy;  Laterality: N/A;  10:30 Am   OPEN REDUCTION INTERNAL FIXATION (ORIF) DISTAL RADIAL FRACTURE Right 12/31/2020   Procedure: OPEN REDUCTION INTERNAL FIXATION (ORIF) DISTAL RADIAL FRACTURE;  Surgeon: Oliver Barre, MD;  Location: [facility];  Service: Orthopedics;  Laterality: Right;   POLYPECTOMY  04/06/2016   Procedure: POLYPECTOMY;  Surgeon: West Bali, MD;  [facility];  Service: Endoscopy;;  hepatic flexure polypectomy;   TUBAL LIGATION  04/11/1977    Current Outpatient Medications  Medication Sig Dispense Refill   alendronate (FOSAMAX) 70 MG tablet Take 1 tablet (70 mg  total) by mouth every 7 (seven) days. Take with a full glass of water on an empty stomach. 4 tablet 11   CALCIUM PO Take 1 tablet by mouth daily after breakfast.     Cholecalciferol (VITAMIN D3) 50 MCG (2000 UT) TABS Take 2,000 Units by mouth in the morning.     potassium chloride (KLOR-CON) 10 MEQ tablet Take 1 tablet by mouth once daily 90 tablet 0   triamterene-hydrochlorothiazide (MAXZIDE-25) 37.5-25 MG tablet Take one and a half tablets by mouth once daily for blood pressure 135 tablet 3   No current facility-administered medications for this visit.    Allergies as of 08/10/2022   (No Known Allergies)    Family History  Problem Relation Age of Onset   Heart disease Mother    Diabetes Father    Heart disease Brother    Cancer Sister        breast    Cancer Sister        stomach    Heart disease Brother    Hypertension Sister    Hypertension Sister    Diabetes Sister    Diabetes Sister     Social History   Socioeconomic History   Marital status: Married    Spouse name: Not on file   Number of children: Not on file   Years of education: college    Highest education level: Associate degree: academic program  Occupational History   Not on file  Tobacco Use   Smoking status: Never   Smokeless tobacco: Never  Substance and Sexual Activity   Alcohol use: No  Drug use: No   Sexual activity: Not Currently    Birth control/protection: Surgical  Other Topics Concern   Not on file  Social History Narrative   Not on file   Social Determinants of Health   Financial Resource Strain: Low Risk  (02/22/2022)   Overall Financial Resource Strain (CARDIA)    Difficulty of Paying Living Expenses: Not hard at all  Food Insecurity: No Food Insecurity (02/22/2022)   Hunger Vital Sign    Worried About Running Out of Food in the Last Year: Never true    Ran Out of Food in the Last Year: Never true  Transportation Needs: No Transportation Needs (02/22/2022)   PRAPARE -  Administrator, Civil Service (Medical): No    Lack of Transportation (Non-Medical): No  Physical Activity: Insufficiently Active (02/08/2021)   Exercise Vital Sign    Days of Exercise per Week: 5 days    Minutes of Exercise per Session: 20 min  Stress: No Stress Concern Present (02/22/2022)   [employer] of Occupational Health - Occupational Stress Questionnaire    Feeling of Stress : Not at all  Social Connections: Socially Integrated (02/22/2022)   Social Connection and Isolation Panel [NHANES]    Frequency of Communication with Friends and Family: More than three times a week    Frequency of Social Gatherings with Friends and Family: Once a week    Attends Religious Services: More than 4 times per year    Active Member of [employer] or Organizations: Yes    Attends Engineer, structural: More than 4 times per year    Marital Status: Married  Catering manager Violence: Not At Risk (02/08/2021)   Humiliation, Afraid, Rape, and Kick questionnaire    Fear of Current or Ex-Partner: No    Emotionally Abused: No    Physically Abused: No    Sexually Abused: No    Review of Systems: Gen: Denies any fever, chills, fatigue, weight loss, lack of appetite.  CV: Denies chest pain, heart palpitations, peripheral edema, syncope.  Resp: Denies shortness of breath at rest or with exertion. Denies wheezing or cough.  GI: Denies dysphagia or odynophagia. Denies jaundice, hematemesis, fecal incontinence. GU : Denies urinary burning, urinary frequency, urinary hesitancy MS: Denies joint pain, muscle weakness, cramps, or limitation of movement.  Derm: Denies rash, itching, dry skin Psych: Denies depression, anxiety, memory loss, and confusion Heme: Denies bruising, bleeding, and enlarged lymph nodes.  Physical Exam: There were no vitals taken for this visit. General:   Alert and oriented. Pleasant and cooperative. Well-nourished and well-developed.  Head:  Normocephalic and  atraumatic. Eyes:  Without icterus, sclera clear and conjunctiva pink.  Ears:  Normal auditory acuity. Lungs:  Clear to auscultation bilaterally. No wheezes, rales, or rhonchi. No distress.  Heart:  S1, S2 present without murmurs appreciated.  Abdomen:  +BS, soft, non-tender and non-distended. No HSM noted. No guarding or rebound. No masses appreciated.  Rectal:  Deferred  Msk:  Symmetrical without gross deformities. Normal posture. Extremities:  Without edema. Neurologic:  Alert and  oriented x4;  grossly normal neurologically. Skin:  Intact without significant lesions or rashes. Psych:  Alert and cooperative. Normal mood and affect.    Assessment:     Plan:  ***   Ermalinda Memos, PA-C [facility] 08/10/2022

## 2022-08-09 ENCOUNTER — Other Ambulatory Visit: Payer: Self-pay | Admitting: Family Medicine

## 2022-08-10 ENCOUNTER — Ambulatory Visit: Payer: Medicare HMO | Admitting: Gastroenterology

## 2022-08-11 NOTE — Progress Notes (Signed)
GI Office Note    Referring Provider: Kerri Perches, MD Primary Care Physician:  Kerri Perches, MD  Primary Gastroenterologist:formerly Dr. Darrick Penna  Chief Complaint   Chief Complaint  Patient presents with   positive cologuard    Positive cologuard. Have not seen any blood. Not having any problems.      History of Present Illness   Teresa Arellano is a 72 y.o. female presenting today at the request of Dr. Lodema Hong for positive Cologuard. She has a history of tubular adenoma in 2017.   Doing well. No constipation. BM 2-3 per day. No melena, brbpr. No abdominal pain. No heartburn, dysphagia, n/v, unintentional weight loss.   Colonoscopy 03/2016: -one 4mm polyp at hepatic flexure removed, polyp tissue not retrieved -internal hemorrhoids -ileum normal    Medications   Current Outpatient Medications  Medication Sig Dispense Refill   alendronate (FOSAMAX) 70 MG tablet Take 1 tablet (70 mg total) by mouth every 7 (seven) days. Take with a full glass of water on an empty stomach. 4 tablet 11   CALCIUM PO Take 1 tablet by mouth daily after breakfast.     Cholecalciferol (VITAMIN D3) 50 MCG (2000 UT) TABS Take 2,000 Units by mouth in the morning.     potassium chloride (KLOR-CON) 10 MEQ tablet Take 1 tablet by mouth once daily 90 tablet 0   triamterene-hydrochlorothiazide (MAXZIDE-25) 37.5-25 MG tablet Take one and a half tablets by mouth once daily for blood pressure 135 tablet 3   No current facility-administered medications for this visit.    Allergies   Allergies as of 08/12/2022   (No Known Allergies)    Past Medical History   Past Medical History:  Diagnosis Date   Cataract    Hypertension 2014    Past Surgical History   Past Surgical History:  Procedure Laterality Date   ABDOMINAL HYSTERECTOMY  04/11/1980   partial, fibroids   CATARACT EXTRACTION W/PHACO Right 05/02/2016   Procedure: CATARACT EXTRACTION PHACO AND INTRAOCULAR LENS PLACEMENT  (IOC);  Surgeon: Gemma Payor, MD;  Location: [facility];  Service: Ophthalmology;  Laterality: Right;  CDE: 5.35   CATARACT EXTRACTION W/PHACO Left 05/23/2016   Procedure: CATARACT EXTRACTION PHACO AND INTRAOCULAR LENS PLACEMENT LEFT EYE;  Surgeon: Gemma Payor, MD;  Location: [facility];  Service: Ophthalmology;  Laterality: Left;  CDE:  5.45   CHOLECYSTECTOMY  1991 approx   COLONOSCOPY N/A 04/06/2016   Procedure: COLONOSCOPY;  Surgeon: West Bali, MD;  [facility];  Service: Endoscopy;  Laterality: N/A;  10:30 Am   OPEN REDUCTION INTERNAL FIXATION (ORIF) DISTAL RADIAL FRACTURE Right 12/31/2020   Procedure: OPEN REDUCTION INTERNAL FIXATION (ORIF) DISTAL RADIAL FRACTURE;  Surgeon: Oliver Barre, MD;  Location: [facility];  Service: Orthopedics;  Laterality: Right;   POLYPECTOMY  04/06/2016   Procedure: POLYPECTOMY;  Surgeon: West Bali, MD;  [facility];  Service: Endoscopy;;  hepatic flexure polypectomy;   TUBAL LIGATION  04/11/1977    Past Family History   Family History  Problem Relation Age of Onset   Heart disease Mother    Diabetes Father    Cancer Sister        "stomach", bag for urine   Cancer Sister        breast cancer   Hypertension Sister    Hypertension Sister    Diabetes Sister    Diabetes Sister    Heart disease Brother    Heart disease Brother  Past Social History   Social History   Socioeconomic History   Marital status: Married    Spouse name: Not on file   Number of children: Not on file   Years of education: college    Highest education level: Associate degree: academic program  Occupational History   Not on file  Tobacco Use   Smoking status: Never   Smokeless tobacco: Never  Substance and Sexual Activity   Alcohol use: No   Drug use: No   Sexual activity: Not Currently    Birth control/protection: Surgical  Other Topics Concern   Not on file  Social History Narrative   Not on file   Social Determinants of Health    Financial Resource Strain: Low Risk  (02/22/2022)   Overall Financial Resource Strain (CARDIA)    Difficulty of Paying Living Expenses: Not hard at all  Food Insecurity: No Food Insecurity (02/22/2022)   Hunger Vital Sign    Worried About Running Out of Food in the Last Year: Never true    Ran Out of Food in the Last Year: Never true  Transportation Needs: No Transportation Needs (02/22/2022)   PRAPARE - Administrator, Civil Service (Medical): No    Lack of Transportation (Non-Medical): No  Physical Activity: Insufficiently Active (02/08/2021)   Exercise Vital Sign    Days of Exercise per Week: 5 days    Minutes of Exercise per Session: 20 min  Stress: No Stress Concern Present (02/22/2022)   [employer] of Occupational Health - Occupational Stress Questionnaire    Feeling of Stress : Not at all  Social Connections: Socially Integrated (02/22/2022)   Social Connection and Isolation Panel [NHANES]    Frequency of Communication with Friends and Family: More than three times a week    Frequency of Social Gatherings with Friends and Family: Once a week    Attends Religious Services: More than 4 times per year    Active Member of [employer] or Organizations: Yes    Attends Engineer, structural: More than 4 times per year    Marital Status: Married  Catering manager Violence: Not At Risk (02/08/2021)   Humiliation, Afraid, Rape, and Kick questionnaire    Fear of Current or Ex-Partner: No    Emotionally Abused: No    Physically Abused: No    Sexually Abused: No    Review of Systems   General: Negative for anorexia, weight loss, fever, chills, fatigue, weakness. Eyes: Negative for vision changes.  ENT: Negative for hoarseness, difficulty swallowing , nasal congestion. CV: Negative for chest pain, angina, palpitations, dyspnea on exertion, peripheral edema.  Respiratory: Negative for dyspnea at rest, dyspnea on exertion, cough, sputum, wheezing.  GI: See  history of present illness. GU:  Negative for dysuria, hematuria, urinary incontinence, urinary frequency, nocturnal urination.  MS: Negative for joint pain, low back pain.  Derm: Negative for rash or itching.  Neuro: Negative for weakness, abnormal sensation, seizure, frequent headaches, memory loss,  confusion.  Psych: Negative for anxiety, depression, suicidal ideation, hallucinations.  Endo: Negative for unusual weight change.  Heme: Negative for bruising or bleeding. Allergy: Negative for rash or hives.  Physical Exam   BP 126/80 (BP Location: Right Arm, Patient Position: Sitting, Cuff Size: Normal)   Pulse 77   Temp 97.6 F (36.4 C) (Temporal)   Ht 5\' 6"  (1.676 m)   Wt 215 lb 3.2 oz (97.6 kg)   SpO2 98%   BMI 34.73 kg/m    General:  Well-nourished, well-developed in no acute distress.  Head: Normocephalic, atraumatic.   Eyes: Conjunctiva pink, no icterus. Mouth: Oropharyngeal mucosa moist and pink  Neck: Supple without thyromegaly, masses, or lymphadenopathy.  Lungs: Clear to auscultation bilaterally.  Heart: Regular rate and rhythm, no murmurs rubs or gallops.  Abdomen: Bowel sounds are normal, nontender, nondistended, no hepatosplenomegaly or masses,  no abdominal bruits or hernia, no rebound or guarding.   Rectal: not performed Extremities: No lower extremity edema. No clubbing or deformities.  Neuro: Alert and oriented x 4 , grossly normal neurologically.  Skin: Warm and dry, no rash or jaundice.   Psych: Alert and cooperative, normal mood and affect.  Labs   Lab Results  Component Value Date   TSH 0.778 07/20/2022   Lab Results  Component Value Date   CREATININE 0.95 07/20/2022   BUN 22 07/20/2022   NA 139 07/20/2022   K 4.3 07/20/2022   CL 100 07/20/2022   CO2 24 07/20/2022   Lab Results  Component Value Date   ALT 19 07/20/2022   AST 24 07/20/2022   ALKPHOS 67 07/20/2022   BILITOT 0.6 07/20/2022   Lab Results  Component Value Date   WBC 8.0  07/20/2022   HGB 13.3 07/20/2022   HCT 41.2 07/20/2022   MCV 82 07/20/2022   PLT 325 07/20/2022    Imaging Studies   No results found.  Assessment   Cologuard positive: h/o tubular adenoma, overdue for surveillance. Now with positive Cologuard. No notable GI symptoms. Plan for colonoscopy.   PLAN   Colonoscopy with Dr. Marletta Lor. ASA 2.  I have discussed the risks, alternatives, benefits with regards to but not limited to the risk of reaction to medication, bleeding, infection, perforation and the patient is agreeable to proceed. Written consent to be obtained.    Leanna Battles. Melvyn Neth, MHS, PA-C [facility]

## 2022-08-12 ENCOUNTER — Other Ambulatory Visit: Payer: Self-pay | Admitting: *Deleted

## 2022-08-12 ENCOUNTER — Encounter: Payer: Self-pay | Admitting: Gastroenterology

## 2022-08-12 ENCOUNTER — Encounter: Payer: Self-pay | Admitting: *Deleted

## 2022-08-12 ENCOUNTER — Ambulatory Visit: Payer: Medicare HMO | Admitting: Gastroenterology

## 2022-08-12 VITALS — BP 126/80 | HR 77 | Temp 97.6°F | Ht 66.0 in | Wt 215.2 lb

## 2022-08-12 DIAGNOSIS — R195 Other fecal abnormalities: Secondary | ICD-10-CM | POA: Diagnosis not present

## 2022-08-12 MED ORDER — PEG 3350-KCL-NA BICARB-NACL 420 G PO SOLR: 4000.0000 mL | Freq: Once | ORAL | 0 refills | Status: AC

## 2022-08-12 NOTE — Patient Instructions (Addendum)
Colonoscopy to be scheduled.    It was a pleasure to see you today. I want to create trusting relationships with patients and provide genuine, compassionate, and quality care. I truly value your feedback, so please be on the lookout for a survey regarding your visit with me today. I appreciate your time in completing this!

## 2022-08-15 ENCOUNTER — Encounter: Payer: Self-pay | Admitting: *Deleted

## 2022-08-15 ENCOUNTER — Encounter: Payer: Self-pay | Admitting: Gastroenterology

## 2022-09-15 ENCOUNTER — Other Ambulatory Visit (HOSPITAL_COMMUNITY)
Admission: RE | Admit: 2022-09-15 | Discharge: 2022-09-15 | Disposition: A | Discharge status: Home / Self Care | Payer: Medicare HMO | Source: Ambulatory Visit | Attending: Internal Medicine | Admitting: Internal Medicine

## 2022-09-15 DIAGNOSIS — R195 Other fecal abnormalities: Secondary | ICD-10-CM | POA: Insufficient documentation

## 2022-09-15 LAB — BASIC METABOLIC PANEL
Anion gap: 10 (ref 5–15)
BUN: 17 mg/dL (ref 8–23)
CO2: 24 mmol/L (ref 22–32)
Calcium: 9.2 mg/dL (ref 8.9–10.3)
Chloride: 103 mmol/L (ref 98–111)
Creatinine, Ser: 0.78 mg/dL (ref 0.44–1.00)
GFR, Estimated: >60 mL/min (ref >60)
Glucose, Bld: 125 mg/dL — ABNORMAL HIGH (ref 70–99)
Potassium: 3.2 mmol/L — ABNORMAL LOW (ref 3.5–5.1)
Sodium: 137 mmol/L (ref 135–145)

## 2022-09-21 ENCOUNTER — Encounter (HOSPITAL_COMMUNITY): Payer: Self-pay

## 2022-09-21 ENCOUNTER — Other Ambulatory Visit: Payer: Self-pay

## 2022-09-21 ENCOUNTER — Encounter (HOSPITAL_COMMUNITY)
Admission: RE | Admit: 2022-09-21 | Discharge: 2022-09-21 | Disposition: A | Discharge status: Home / Self Care | Payer: Medicare HMO | Source: Ambulatory Visit | Attending: Internal Medicine | Admitting: Internal Medicine

## 2022-09-23 ENCOUNTER — Encounter (HOSPITAL_COMMUNITY)
Admission: RE | Disposition: A | Discharge status: Home / Self Care | Payer: Self-pay | Source: Home / Self Care | Attending: Internal Medicine

## 2022-09-23 ENCOUNTER — Ambulatory Visit (HOSPITAL_BASED_OUTPATIENT_CLINIC_OR_DEPARTMENT_OTHER): Payer: Medicare HMO | Admitting: Anesthesiology

## 2022-09-23 ENCOUNTER — Encounter (HOSPITAL_COMMUNITY): Payer: Self-pay

## 2022-09-23 ENCOUNTER — Ambulatory Visit (HOSPITAL_COMMUNITY)
Admission: RE | Admit: 2022-09-23 | Discharge: 2022-09-23 | Disposition: A | Discharge status: Home / Self Care | Payer: Medicare HMO | Attending: Internal Medicine | Admitting: Internal Medicine

## 2022-09-23 ENCOUNTER — Other Ambulatory Visit: Payer: Self-pay

## 2022-09-23 ENCOUNTER — Ambulatory Visit (HOSPITAL_COMMUNITY): Payer: Medicare HMO | Admitting: Anesthesiology

## 2022-09-23 DIAGNOSIS — I10 Essential (primary) hypertension: Secondary | ICD-10-CM | POA: Diagnosis not present

## 2022-09-23 DIAGNOSIS — Z1211 Encounter for screening for malignant neoplasm of colon: Secondary | ICD-10-CM | POA: Insufficient documentation

## 2022-09-23 DIAGNOSIS — K635 Polyp of colon: Secondary | ICD-10-CM | POA: Diagnosis not present

## 2022-09-23 DIAGNOSIS — R195 Other fecal abnormalities: Secondary | ICD-10-CM

## 2022-09-23 DIAGNOSIS — D124 Benign neoplasm of descending colon: Secondary | ICD-10-CM | POA: Insufficient documentation

## 2022-09-23 DIAGNOSIS — K648 Other hemorrhoids: Secondary | ICD-10-CM | POA: Insufficient documentation

## 2022-09-23 DIAGNOSIS — D122 Benign neoplasm of ascending colon: Secondary | ICD-10-CM | POA: Insufficient documentation

## 2022-09-23 DIAGNOSIS — D126 Benign neoplasm of colon, unspecified: Secondary | ICD-10-CM | POA: Diagnosis not present

## 2022-09-23 HISTORY — PX: POLYPECTOMY: SHX149

## 2022-09-23 HISTORY — PX: COLONOSCOPY WITH PROPOFOL: SHX5780

## 2022-09-23 SURGERY — COLONOSCOPY WITH PROPOFOL
Anesthesia: General

## 2022-09-23 MED ORDER — LACTATED RINGERS IV SOLN: INTRAVENOUS | Status: DC

## 2022-09-23 MED ORDER — PROPOFOL 10 MG/ML IV BOLUS
INTRAVENOUS | Status: DC | PRN
  Administered 2022-09-23: 140 mg via INTRAVENOUS
  Administered 2022-09-23: 20 mg via INTRAVENOUS

## 2022-09-23 MED ORDER — PROPOFOL 500 MG/50ML IV EMUL
INTRAVENOUS | Status: DC | PRN
  Administered 2022-09-23: 75 ug/kg/min via INTRAVENOUS

## 2022-09-23 NOTE — Transfer of Care (Signed)
Immediate Anesthesia Transfer of Care Note  Patient: Teresa Arellano  Procedure(s) Performed: COLONOSCOPY WITH PROPOFOL POLYPECTOMY INTESTINAL  Patient Location: Short Stay  Anesthesia Type:General  Level of Consciousness: awake and alert   Airway & Oxygen Therapy: Patient Spontanous Breathing  Post-op Assessment: Report given to RN and Post -op Vital signs reviewed and stable  Post vital signs: Reviewed and stable  Last Vitals:  Vitals Value Taken Time  BP 114/50 09/23/22 0821  Temp 36.4 C 09/23/22 0821  Pulse 72 09/23/22  0824  Resp 21 09/23/22 0821  SpO2 98 % 09/23/22 0821    Last Pain:  Vitals:   09/23/22 0821  TempSrc: Oral  PainSc: 0-No pain         Complications: No notable events documented.

## 2022-09-23 NOTE — Anesthesia Preprocedure Evaluation (Signed)
Anesthesia Evaluation  Patient identified by MRN, date of birth, ID band Patient awake    Reviewed: Allergy & Precautions, H&P , NPO status , Patient's Chart, lab work & pertinent test results  Airway Mallampati: II  TM Distance: >3 FB Neck ROM: Full    Dental  (+) Dental Advisory Given, Missing, Upper Dentures   Pulmonary neg pulmonary ROS   Pulmonary exam normal breath sounds clear to auscultation       Cardiovascular hypertension, Pt. on medications Normal cardiovascular exam Rhythm:Regular Rate:Normal     Neuro/Psych negative neurological ROS  negative psych ROS   GI/Hepatic negative GI ROS, Neg liver ROS,,,  Endo/Other  negative endocrine ROS    Renal/GU negative Renal ROS  negative genitourinary   Musculoskeletal negative musculoskeletal ROS (+)    Abdominal   Peds negative pediatric ROS (+)  Hematology negative hematology ROS (+)   Anesthesia Other Findings   Reproductive/Obstetrics negative OB ROS                             Anesthesia Physical Anesthesia Plan  ASA: 2  Anesthesia Plan: General   Post-op Pain Management: Minimal or no pain anticipated   Induction: Intravenous  PONV Risk Score and Plan: 1 and Propofol infusion  Airway Management Planned: Nasal Cannula and Natural Airway  Additional Equipment:   Intra-op Plan:   Post-operative Plan:   Informed Consent: I have reviewed the patients History and Physical, chart, labs and discussed the procedure including the risks, benefits and alternatives for the proposed anesthesia with the patient or authorized representative who has indicated his/her understanding and acceptance.     Dental advisory given  Plan Discussed with: CRNA and Surgeon  Anesthesia Plan Comments:        Anesthesia Quick Evaluation

## 2022-09-23 NOTE — H&P (Signed)
Primary Care Physician:  Kerri Perches, MD Primary Gastroenterologist:  Dr. Marletta Lor  Pre-Procedure History & Physical: HPI:  Teresa Arellano is a 72 y.o. female is here for a colonoscopy to be performed for positive cologuard testing  Past Medical History:  Diagnosis Date   Cataract    Hypertension 2014    Past Surgical History:  Procedure Laterality Date   ABDOMINAL HYSTERECTOMY  04/11/1980   partial, fibroids   CATARACT EXTRACTION W/PHACO Right 05/02/2016   Procedure: CATARACT EXTRACTION PHACO AND INTRAOCULAR LENS PLACEMENT (IOC);  Surgeon: Gemma Payor, MD;  Location: [facility];  Service: Ophthalmology;  Laterality: Right;  CDE: 5.35   CATARACT EXTRACTION W/PHACO Left 05/23/2016   Procedure: CATARACT EXTRACTION PHACO AND INTRAOCULAR LENS PLACEMENT LEFT EYE;  Surgeon: Gemma Payor, MD;  Location: [facility];  Service: Ophthalmology;  Laterality: Left;  CDE:  5.45   CHOLECYSTECTOMY  1991 approx   COLONOSCOPY N/A 04/06/2016   Procedure: COLONOSCOPY;  Surgeon: West Bali, MD;  [facility];  Service: Endoscopy;  Laterality: N/A;  10:30 Am   OPEN REDUCTION INTERNAL FIXATION (ORIF) DISTAL RADIAL FRACTURE Right 12/31/2020   Procedure: OPEN REDUCTION INTERNAL FIXATION (ORIF) DISTAL RADIAL FRACTURE;  Surgeon: Oliver Barre, MD;  Location: [facility];  Service: Orthopedics;  Laterality: Right;   POLYPECTOMY  04/06/2016   Procedure: POLYPECTOMY;  Surgeon: West Bali, MD;  [facility];  Service: Endoscopy;;  hepatic flexure polypectomy;   TUBAL LIGATION  04/11/1977    Prior to Admission medications   Medication Sig Start Date End Date Taking? Authorizing Provider  alendronate (FOSAMAX) 70 MG tablet Take 1 tablet (70 mg total) by mouth every 7 (seven) days. Take with a full glass of water on an empty stomach. 01/27/22  Yes Kerri Perches, MD  CALCIUM PO Take 1 tablet by mouth daily after breakfast.   Yes [provider]  Cholecalciferol (VITAMIN D3) 50 MCG  (2000 UT) TABS Take 2,000 Units by mouth in the morning.   Yes [provider]  potassium chloride (KLOR-CON) 10 MEQ tablet Take 1 tablet by mouth once daily 08/09/22  Yes Kerri Perches, MD  triamterene-hydrochlorothiazide [facility]) 37.5-25 MG tablet Take one and a half tablets by mouth once daily for blood pressure 01/27/22  Yes Kerri Perches, MD    Allergies as of 08/12/2022   (No Known Allergies)    Family History  Problem Relation Age of Onset   Heart disease Mother    Diabetes Father    Cancer Sister        "stomach", bag for urine   Cancer Sister        breast cancer   Hypertension Sister    Hypertension Sister    Diabetes Sister    Diabetes Sister    Heart disease Brother    Heart disease Brother     Social History   Socioeconomic History   Marital status: Married    Spouse name: Not on file   Number of children: Not on file   Years of education: college    Highest education level: Associate degree: academic program  Occupational History   Not on file  Tobacco Use   Smoking status: Never   Smokeless tobacco: Never  Vaping Use   Vaping Use: Never used  Substance and Sexual Activity   Alcohol use: No   Drug use: No   Sexual activity: Not Currently    Birth control/protection: Surgical  Other Topics Concern  Not on file  Social History Narrative   Not on file   Social Determinants of Health   Financial Resource Strain: Low Risk  (02/22/2022)   Overall Financial Resource Strain (CARDIA)    Difficulty of Paying Living Expenses: Not hard at all  Food Insecurity: No Food Insecurity (02/22/2022)   Hunger Vital Sign    Worried About Running Out of Food in the Last Year: Never true    Ran Out of Food in the Last Year: Never true  Transportation Needs: No Transportation Needs (02/22/2022)   PRAPARE - Administrator, Civil Service (Medical): No    Lack of Transportation (Non-Medical): No  Physical Activity: Insufficiently  Active (02/08/2021)   Exercise Vital Sign    Days of Exercise per Week: 5 days    Minutes of Exercise per Session: 20 min  Stress: No Stress Concern Present (02/22/2022)   [employer] of Occupational Health - Occupational Stress Questionnaire    Feeling of Stress : Not at all  Social Connections: Socially Integrated (02/22/2022)   Social Connection and Isolation Panel [NHANES]    Frequency of Communication with Friends and Family: More than three times a week    Frequency of Social Gatherings with Friends and Family: Once a week    Attends Religious Services: More than 4 times per year    Active Member of Clubs or Organizations: Yes    Attends Banker Meetings: More than 4 times per year    Marital Status: Married  Catering manager Violence: Not At Risk (02/08/2021)   Humiliation, Afraid, Rape, and Kick questionnaire    Fear of Current or Ex-Partner: No    Emotionally Abused: No    Physically Abused: No    Sexually Abused: No    Review of Systems: See HPI, otherwise negative ROS  Physical Exam: Vital signs in last 24 hours: Temp:  [97.9 F (36.6 C)] 97.9 F (36.6 C) (06/14 0706) Pulse Rate:  [78] 78 (06/14 0706) Resp:  [20] 20 (06/14 0706) BP: (134)/(77) 134/77 (06/14 0706) SpO2:  [100 %] 100 % (06/14 0706) Weight:  [92.5 kg] 92.5 kg (06/14 0706)   General:   Alert,  Well-developed, well-nourished, pleasant and cooperative in NAD Head:  Normocephalic and atraumatic. Eyes:  Sclera clear, no icterus.   Conjunctiva pink. Ears:  Normal auditory acuity. Nose:  No deformity, discharge,  or lesions. Msk:  Symmetrical without gross deformities. Normal posture. Extremities:  Without clubbing or edema. Neurologic:  Alert and  oriented x4;  grossly normal neurologically. Skin:  Intact without significant lesions or rashes. Psych:  Alert and cooperative. Normal mood and affect.  Impression/Plan: Teresa Arellano is here for a colonoscopy to be performed for  positive cologuard testing  The risks of the procedure including infection, bleed, or perforation as well as benefits, limitations, alternatives and imponderables have been reviewed with the patient. Questions have been answered. All parties agreeable.

## 2022-09-23 NOTE — Anesthesia Postprocedure Evaluation (Signed)
Anesthesia Post Note  Patient: Teresa Arellano  Procedure(s) Performed: COLONOSCOPY WITH PROPOFOL POLYPECTOMY INTESTINAL  Patient location during evaluation: Phase II Anesthesia Type: General Level of consciousness: awake and alert and oriented Pain management: pain level controlled Vital Signs Assessment: post-procedure vital signs reviewed and stable Respiratory status: spontaneous breathing, nonlabored ventilation and respiratory function stable Cardiovascular status: blood pressure returned to baseline and stable Postop Assessment: no apparent nausea or vomiting Anesthetic complications: no  No notable events documented.   Last Vitals:  Vitals:   09/23/22 0706 09/23/22 0821  BP: 134/77 (!) 114/50  Pulse: 78   Resp: 20 (!) 21  Temp: 36.6 C 36.4 C  SpO2: 100% 98%    Last Pain:  Vitals:   09/23/22 0821  TempSrc: Oral  PainSc: 0-No pain                 Ziasia Lenoir C Rion Catala

## 2022-09-23 NOTE — Discharge Instructions (Signed)
°  Colonoscopy °Discharge Instructions ° °Read the instructions outlined below and refer to this sheet in the next few weeks. These discharge instructions provide you with general information on caring for yourself after you leave the hospital. Your doctor may also give you specific instructions. While your treatment has been planned according to the most current medical practices available, unavoidable complications occasionally occur.  ° °ACTIVITY °You may resume your regular activity, but move at a slower pace for the next 24 hours.  °Take frequent rest periods for the next 24 hours.  °Walking will help get rid of the air and reduce the bloated feeling in your belly (abdomen).  °No driving for 24 hours (because of the medicine (anesthesia) used during the test).   °Do not sign any important legal documents or operate any machinery for 24 hours (because of the anesthesia used during the test).  °NUTRITION °Drink plenty of fluids.  °You may resume your normal diet as instructed by your doctor.  °Begin with a light meal and progress to your normal diet. Heavy or fried foods are harder to digest and may make you feel sick to your stomach (nauseated).  °Avoid alcoholic beverages for 24 hours or as instructed.  °MEDICATIONS °You may resume your normal medications unless your doctor tells you otherwise.  °WHAT YOU CAN EXPECT TODAY °Some feelings of bloating in the abdomen.  °Passage of more gas than usual.  °Spotting of blood in your stool or on the toilet paper.  °IF YOU HAD POLYPS REMOVED DURING THE COLONOSCOPY: °No aspirin products for 7 days or as instructed.  °No alcohol for 7 days or as instructed.  °Eat a soft diet for the next 24 hours.  °FINDING OUT THE RESULTS OF YOUR TEST °Not all test results are available during your visit. If your test results are not back during the visit, make an appointment with your caregiver to find out the results. Do not assume everything is normal if you have not heard from your  caregiver or the medical facility. It is important for you to follow up on all of your test results.  °SEEK IMMEDIATE MEDICAL ATTENTION IF: °You have more than a spotting of blood in your stool.  °Your belly is swollen (abdominal distention).  °You are nauseated or vomiting.  °You have a temperature over 101.  °You have abdominal pain or discomfort that is severe or gets worse throughout the day.  ° °Your colonoscopy revealed 3 polyp(s) which I removed successfully. Await pathology results, my office will contact you. I recommend repeating colonoscopy in 5 years for surveillance purposes. Otherwise follow up with GI as needed.  ° ° °I hope you have a great rest of your week! ° °Adeyemi Hamad K. Gavinn Collard, D.O. °Gastroenterology and Hepatology °[facility] ° °

## 2022-09-23 NOTE — Anesthesia Procedure Notes (Signed)
Date/Time: 09/23/2022 7:59 AM  Performed by: Franco Nones, CRNAPre-anesthesia Checklist: Patient identified, Emergency Drugs available, Suction available, Timeout performed and Patient being monitored Patient Re-evaluated:Patient Re-evaluated prior to induction Oxygen Delivery Method: Nasal Cannula

## 2022-09-23 NOTE — Op Note (Signed)
[facility] Patient Name: Teresa Arellano Procedure Date: 09/23/2022 7:45 AM MRN: [id number] Date of Birth: [birth year] Attending MD: Hennie Duos. Marletta Lor , [place], [id number] CSN: [id number] Age: 72 Admit Type: Outpatient Procedure:                Colonoscopy Indications:              Screening for colorectal malignant neoplasm,                            Incidental - Positive Cologuard test Providers:                Hennie Duos. Marletta Lor, DO, Crystal Page, Dyann Ruddle Referring MD:              Medicines:                See the Anesthesia note for documentation of the                            administered medications Complications:            No immediate complications. Estimated Blood Loss:     Estimated blood loss was minimal. Procedure:                Pre-Anesthesia Assessment:                           - The anesthesia plan was to use monitored                            anesthesia care (MAC).                           After obtaining informed consent, the colonoscope                            was passed under direct vision. Throughout the                            procedure, the patient's blood pressure, pulse, and                            oxygen saturations were monitored continuously. The                            [id number]-HQ190L ([id number]) scope was introduced through                            the anus and advanced to the the terminal ileum,                            with identification of the appendiceal orifice and                            IC valve. The colonoscopy was performed without                            difficulty.  The patient tolerated the procedure                            well. The quality of the bowel preparation was                            evaluated using the BBPS [facility] Bowel Preparation                            Scale) with scores of: Right Colon = 3, Transverse                            Colon = 3 and Left Colon = 3 (entire mucosa seen                             well with no residual staining, small fragments of                            stool or opaque liquid). The total BBPS score                            equals 9. Scope In: 8:04:56 AM Scope Out: 8:17:05 AM Scope Withdrawal Time: 0 hours 10 minutes 40 seconds  Total Procedure Duration: 0 hours 12 minutes 9 seconds  Findings:      Non-bleeding internal hemorrhoids were found during endoscopy.      Two sessile polyps were found in the ascending colon. The polyps were 4       to 5 mm in size. These polyps were removed with a cold snare. Resection       and retrieval were complete.      A 3 mm polyp was found in the descending colon. The polyp was sessile.       The polyp was removed with a cold snare. Resection and retrieval were       complete.      The terminal ileum appeared normal. Impression:               - Non-bleeding internal hemorrhoids.                           - Two 4 to 5 mm polyps in the ascending colon,                            removed with a cold snare. Resected and retrieved.                           - One 3 mm polyp in the descending colon, removed                            with a cold snare. Resected and retrieved.                           - The examined portion of the ileum was normal. Moderate Sedation:      Per Anesthesia Care Recommendation:           -  Patient has a contact number available for                            emergencies. The signs and symptoms of potential                            delayed complications were discussed with the                            patient. Return to normal activities tomorrow.                            Written discharge instructions were provided to the                            patient.                           - Resume previous diet.                           - Continue present medications.                           - Await pathology results.                           - Repeat colonoscopy in 5 years for  surveillance.                           - Return to GI clinic PRN. Procedure Code(s):        --- Professional ---                           [phone], Colonoscopy, flexible; with removal of                            tumor(s), polyp(s), or other lesion(s) by snare                            technique Diagnosis Code(s):        --- Professional ---                           Z12.11, Encounter for screening for malignant                            neoplasm of colon                           D12.2, Benign neoplasm of ascending colon                           D12.4, Benign neoplasm of descending colon                           K64.8, Other hemorrhoids CPT  copyright 2022 American Medical Association. All rights reserved. The codes documented in this report are preliminary and upon coder review may  be revised to meet current compliance requirements. Hennie Duos. Marletta Lor, DO Hennie Duos. Marletta Lor, DO 09/23/2022 8:20:10 AM This report has been signed electronically. Number of Addenda: 0

## 2022-09-26 LAB — SURGICAL PATHOLOGY

## 2022-09-29 ENCOUNTER — Encounter (HOSPITAL_COMMUNITY): Payer: Self-pay | Admitting: Internal Medicine

## 2022-09-30 ENCOUNTER — Ambulatory Visit: Payer: Medicare HMO | Admitting: Family Medicine

## 2022-11-03 ENCOUNTER — Ambulatory Visit (INDEPENDENT_AMBULATORY_CARE_PROVIDER_SITE_OTHER): Payer: Medicare HMO | Admitting: Family Medicine

## 2022-11-03 ENCOUNTER — Encounter: Payer: Self-pay | Admitting: Family Medicine

## 2022-11-03 VITALS — BP 121/80 | HR 69 | Ht 66.0 in | Wt 202.1 lb

## 2022-11-03 DIAGNOSIS — M8580 Other specified disorders of bone density and structure, unspecified site: Secondary | ICD-10-CM

## 2022-11-03 DIAGNOSIS — D126 Benign neoplasm of colon, unspecified: Secondary | ICD-10-CM | POA: Diagnosis not present

## 2022-11-03 DIAGNOSIS — E669 Obesity, unspecified: Secondary | ICD-10-CM

## 2022-11-03 DIAGNOSIS — I10 Essential (primary) hypertension: Secondary | ICD-10-CM

## 2022-11-03 MED ORDER — PHENTERMINE HCL 37.5 MG PO TABS: ORAL_TABLET | ORAL | 1 refills | Status: AC

## 2022-11-03 NOTE — Patient Instructions (Signed)
F/u in 13 to  14 weeks, call if you need me sooner  BP is excellent , no med change  Start HALF phentermine every morning with breakfast to help with weight loss  VERY IMPORTANT  that you eat 3 times each day and commit to 64 ounces water , as we discussed  It is important that you exercise regularly at least 30 minutes 5 times a week. If you develop chest pain, have severe difficulty breathing, or feel very tired, stop exercising immediately and seek medical attention  Thanks for choosing [facility], we consider it a privelige to serve you.

## 2022-11-04 ENCOUNTER — Encounter: Payer: Self-pay | Admitting: Family Medicine

## 2022-11-04 DIAGNOSIS — D126 Benign neoplasm of colon, unspecified: Secondary | ICD-10-CM | POA: Insufficient documentation

## 2022-11-04 NOTE — Assessment & Plan Note (Signed)
Rept colonoscopy in 2029

## 2022-11-04 NOTE — Progress Notes (Signed)
Teresa Arellano     MRN: [id number]      DOB: 11-29-1950  Chief Complaint  Patient presents with   Follow-up    Follow up    HPI Teresa Arellano is here for follow up and re-evaluation of chronic medical conditions, medication management and review of any available recent lab and radiology data.  Preventive health is updated, specifically  Cancer screening and Immunization.   Questions or concerns regarding consultations or procedures which the PT has had in the interim are  addressed. The PT denies any adverse reactions to current medications since the last visit.  Requests medication to help with weight loss, she has done extremely well in past 2 months with cutting back on food intake dramatically and also healthy food choice She remains active, caring for children ROS Denies recent fever or chills. Denies sinus pressure, nasal congestion, ear pain or sore throat. Denies chest congestion, productive cough or wheezing. Denies chest pains, palpitations and leg swelling Denies abdominal pain, nausea, vomiting,diarrhea or constipation.   Denies dysuria, frequency, hesitancy or incontinence. Denies joint pain, swelling and limitation in mobility. Denies headaches, seizures, numbness, or tingling. Denies depression, anxiety or insomnia. Denies skin break down or rash.   PE  BP 121/80 (BP Location: Right Arm, Patient Position: Sitting, Cuff Size: Large)   Pulse 69   Ht 5\' 6"  (1.676 m)   Wt 202 lb 1.9 oz (91.7 kg)   SpO2 97%   BMI 32.62 kg/m   Patient alert and oriented and in no cardiopulmonary distress.  HEENT: No facial asymmetry, EOMI,     Neck supple .  Chest: Clear to auscultation bilaterally.  CVS: S1, S2 no murmurs, no S3.Regular rate.  ABD: Soft non tender.   Ext: No edema  MS: Adequate ROM spine, shoulders, hips and knees.  Skin: Intact, no ulcerations or rash noted.  Psych: Good eye contact, normal affect. Memory intact not anxious or depressed  appearing.  CNS: CN 2-12 intact, power,  normal throughout.no focal deficits noted.   Assessment & Plan  Essential hypertension, benign Controlled, no change in medication DASH diet and commitment to daily physical activity for a minimum of 30 minutes discussed and encouraged, as a part of hypertension management. The importance of attaining a healthy weight is also discussed.     11/03/2022    9:09 AM 09/23/2022    8:21 AM 09/23/2022    7:06 AM 09/21/2022    1:17 PM 08/12/2022    8:27 AM 07/15/2022   11:15 AM 01/27/2022   11:19 AM  BP/Weight  Systolic BP 121 114 134  126 130 122  Diastolic BP 80 50 77  80 78 75  Wt. (Lbs) 202.12  204 204 215.2 214.04 213.08  BMI 32.62 kg/m2  32.93 kg/m2 32.93 kg/m2 34.73 kg/m2 34.55 kg/m2 34.39 kg/m2       Obesity (BMI 30.0-34.9) Marked improvement  Start half phentermine dail  Patient re-educated about  the importance of commitment to a  minimum of 150 minutes of exercise per week as able.  The importance of healthy food choices with portion control discussed, as well as eating regularly and within a 12 hour window most days. The need to choose "clean , [place]" food 50 to 75% of the time is discussed, as well as to make water the primary drink and set a goal of 64 ounces water daily.       11/03/2022    9:09 AM 09/23/2022    7:06 AM  09/21/2022    1:17 PM  Weight /BMI  Weight 202 lb 1.9 oz 204 lb 204 lb  Height 5\' 6"  (1.676 m) 5\' 6"  (1.676 m) 5\' 6"  (1.676 m)  BMI 32.62 kg/m2 32.93 kg/m2 32.93 kg/m2      Tubular adenoma of colon Rept colonoscopy in 2029

## 2022-11-04 NOTE — Assessment & Plan Note (Signed)
Controlled, no change in medication DASH diet and commitment to daily physical activity for a minimum of 30 minutes discussed and encouraged, as a part of hypertension management. The importance of attaining a healthy weight is also discussed.     11/03/2022    9:09 AM 09/23/2022    8:21 AM 09/23/2022    7:06 AM 09/21/2022    1:17 PM 08/12/2022    8:27 AM 07/15/2022   11:15 AM 01/27/2022   11:19 AM  BP/Weight  Systolic BP 121 114 134  126 130 122  Diastolic BP 80 50 77  80 78 75  Wt. (Lbs) 202.12  204 204 215.2 214.04 213.08  BMI 32.62 kg/m2  32.93 kg/m2 32.93 kg/m2 34.73 kg/m2 34.55 kg/m2 34.39 kg/m2

## 2022-11-04 NOTE — Assessment & Plan Note (Signed)
Marked improvement  Start half phentermine dail  Patient re-educated about  the importance of commitment to a  minimum of 150 minutes of exercise per week as able.  The importance of healthy food choices with portion control discussed, as well as eating regularly and within a 12 hour window most days. The need to choose "clean , [place]" food 50 to 75% of the time is discussed, as well as to make water the primary drink and set a goal of 64 ounces water daily.       11/03/2022    9:09 AM 09/23/2022    7:06 AM 09/21/2022    1:17 PM  Weight /BMI  Weight 202 lb 1.9 oz 204 lb 204 lb  Height 5\' 6"  (1.676 m) 5\' 6"  (1.676 m) 5\' 6"  (1.676 m)  BMI 32.62 kg/m2 32.93 kg/m2 32.93 kg/m2

## 2022-11-05 DIAGNOSIS — I129 Hypertensive chronic kidney disease with stage 1 through stage 4 chronic kidney disease, or unspecified chronic kidney disease: Secondary | ICD-10-CM | POA: Diagnosis not present

## 2022-11-05 DIAGNOSIS — N182 Chronic kidney disease, stage 2 (mild): Secondary | ICD-10-CM | POA: Diagnosis not present

## 2022-11-05 DIAGNOSIS — M810 Age-related osteoporosis without current pathological fracture: Secondary | ICD-10-CM | POA: Diagnosis not present

## 2022-11-05 DIAGNOSIS — Z008 Encounter for other general examination: Secondary | ICD-10-CM | POA: Diagnosis not present

## 2022-11-05 DIAGNOSIS — M1990 Unspecified osteoarthritis, unspecified site: Secondary | ICD-10-CM | POA: Diagnosis not present

## 2022-11-05 DIAGNOSIS — Z7983 Long term (current) use of bisphosphonates: Secondary | ICD-10-CM | POA: Diagnosis not present

## 2022-11-05 DIAGNOSIS — Z6832 Body mass index (BMI) 32.0-32.9, adult: Secondary | ICD-10-CM | POA: Diagnosis not present

## 2022-11-05 DIAGNOSIS — Z833 Family history of diabetes mellitus: Secondary | ICD-10-CM | POA: Diagnosis not present

## 2022-11-05 DIAGNOSIS — E669 Obesity, unspecified: Secondary | ICD-10-CM | POA: Diagnosis not present

## 2022-11-05 DIAGNOSIS — E876 Hypokalemia: Secondary | ICD-10-CM | POA: Diagnosis not present

## 2022-11-05 DIAGNOSIS — G4733 Obstructive sleep apnea (adult) (pediatric): Secondary | ICD-10-CM | POA: Diagnosis not present

## 2022-11-05 DIAGNOSIS — H3530 Unspecified macular degeneration: Secondary | ICD-10-CM | POA: Diagnosis not present

## 2022-12-06 ENCOUNTER — Other Ambulatory Visit: Payer: Self-pay | Admitting: Family Medicine

## 2022-12-14 ENCOUNTER — Encounter: Payer: Self-pay | Admitting: Family Medicine

## 2023-02-03 ENCOUNTER — Ambulatory Visit (INDEPENDENT_AMBULATORY_CARE_PROVIDER_SITE_OTHER): Payer: Medicare HMO | Admitting: Family Medicine

## 2023-02-03 ENCOUNTER — Encounter: Payer: Self-pay | Admitting: Family Medicine

## 2023-02-03 VITALS — BP 119/77 | HR 82 | Ht 66.0 in | Wt 193.0 lb

## 2023-02-03 DIAGNOSIS — E66811 Obesity, class 1: Secondary | ICD-10-CM | POA: Diagnosis not present

## 2023-02-03 DIAGNOSIS — Z23 Encounter for immunization: Secondary | ICD-10-CM | POA: Diagnosis not present

## 2023-02-03 DIAGNOSIS — E559 Vitamin D deficiency, unspecified: Secondary | ICD-10-CM | POA: Diagnosis not present

## 2023-02-03 DIAGNOSIS — I10 Essential (primary) hypertension: Secondary | ICD-10-CM | POA: Diagnosis not present

## 2023-02-03 DIAGNOSIS — Z13220 Encounter for screening for lipoid disorders: Secondary | ICD-10-CM | POA: Diagnosis not present

## 2023-02-03 DIAGNOSIS — Z1231 Encounter for screening mammogram for malignant neoplasm of breast: Secondary | ICD-10-CM

## 2023-02-03 MED ORDER — PHENTERMINE HCL 37.5 MG PO TABS
ORAL_TABLET | ORAL | 1 refills | Status: DC
Start: 2023-02-27 — End: 2023-06-30

## 2023-02-03 MED ORDER — ALENDRONATE SODIUM 70 MG PO TABS: 70.0000 mg | ORAL_TABLET | ORAL | 11 refills | Status: DC

## 2023-02-03 NOTE — Patient Instructions (Addendum)
F/U in 16 to 18 week , call if you need me sooner   Flu vaccine in office today  Please schedule  mammogram at checkout  Please get updated covid vaccine at your pharmacy next week  CONGRATULATIONS, keep up the great work  Fating CBC, lipid, cmp and eGFR, tSH and vit D 1week before next appt  It is important that you exercise regularly at least 30 minutes 5 times a week. If you develop chest pain, have severe difficulty breathing, or feel very tired, stop exercising immediately and seek medical attention   Thanks for choosing [facility], we consider it a privelige to serve you.   C

## 2023-02-05 ENCOUNTER — Encounter: Payer: Self-pay | Admitting: Family Medicine

## 2023-02-05 DIAGNOSIS — Z13220 Encounter for screening for lipoid disorders: Secondary | ICD-10-CM | POA: Insufficient documentation

## 2023-02-05 DIAGNOSIS — Z23 Encounter for immunization: Secondary | ICD-10-CM | POA: Insufficient documentation

## 2023-02-05 HISTORY — DX: Encounter for immunization: Z23

## 2023-02-05 NOTE — Assessment & Plan Note (Signed)
Updated lab needed at/ before next visit.   

## 2023-02-05 NOTE — Assessment & Plan Note (Signed)
Excellent weight loss, continue current  management  Patient re-educated about  the importance of commitment to a  minimum of 150 minutes of exercise per week as able.  The importance of healthy food choices with portion control discussed, as well as eating regularly and within a 12 hour window most days. The need to choose "clean , [place]" food 50 to 75% of the time is discussed, as well as to make water the primary drink and set a goal of 64 ounces water daily.       02/03/2023    8:56 AM 11/03/2022    9:09 AM 09/23/2022    7:06 AM  Weight /BMI  Weight 193 lb 0.6 oz 202 lb 1.9 oz 204 lb  Height 5\' 6"  (1.676 m) 5\' 6"  (1.676 m) 5\' 6"  (1.676 m)  BMI 31.16 kg/m2 32.62 kg/m2 32.93 kg/m2

## 2023-02-05 NOTE — Assessment & Plan Note (Signed)
After obtaining informed consent, the vaccine is  administered , with no adverse effect noted at the time of administration.  

## 2023-02-05 NOTE — Assessment & Plan Note (Signed)
Controlled, no change in medication DASH diet and commitment to daily physical activity for a minimum of 30 minutes discussed and encouraged, as a part of hypertension management. The importance of attaining a healthy weight is also discussed.     02/03/2023    8:56 AM 11/03/2022    9:09 AM 09/23/2022    8:21 AM 09/23/2022    7:06 AM 09/21/2022    1:17 PM 08/12/2022    8:27 AM 07/15/2022   11:15 AM  BP/Weight  Systolic BP 119 121 114 134  126 130  Diastolic BP 77 80 50 77  80 78  Wt. (Lbs) 193.04 202.12  204 204 215.2 214.04  BMI 31.16 kg/m2 32.62 kg/m2  32.93 kg/m2 32.93 kg/m2 34.73 kg/m2 34.55 kg/m2

## 2023-02-05 NOTE — Assessment & Plan Note (Signed)
Hyperlipidemia:Low fat diet discussed and encouraged.   Lipid Panel  Lab Results  Component Value Date   CHOL 163 07/20/2022   HDL 65 07/20/2022   LDLCALC 86 07/20/2022   TRIG 62 07/20/2022   CHOLHDL 2.5 07/20/2022     Updated lab needed at/ before next visit.

## 2023-02-05 NOTE — Progress Notes (Signed)
Teresa Arellano     MRN: [id number]      DOB: [birth year]  Chief Complaint  Patient presents with   Follow-up    Follow up    HPI Ms. Teresa Arellano is here for follow up and re-evaluation of chronic medical conditions, medication management and review of any available recent lab and radiology data.  Preventive health is updated, specifically  Cancer screening and Immunization.   Questions or concerns regarding consultations or procedures which the PT has had in the interim are  addressed. The PT denies any adverse reactions to current medications since the last visit.  There are no new concerns.  There are no specific complaints   ROS Denies recent fever or chills. Denies sinus pressure, nasal congestion, ear pain or sore throat. Denies chest congestion, productive cough or wheezing. Denies chest pains, palpitations and leg swelling Denies abdominal pain, nausea, vomiting,diarrhea or constipation.   Denies dysuria, frequency, hesitancy or incontinence. Denies joint pain, swelling and limitation in mobility. Denies headaches, seizures, numbness, or tingling. Denies depression, anxiety or insomnia. Denies skin break down or rash.   PE  BP 119/77 (BP Location: Right Arm, Patient Position: Sitting, Cuff Size: Large)   Pulse 82   Ht 5\' 6"  (1.676 m)   Wt 193 lb 0.6 oz (87.6 kg)   SpO2 92%   BMI 31.16 kg/m   Patient alert and oriented and in no cardiopulmonary distress.  HEENT: No facial asymmetry, EOMI,     Neck supple .  Chest: Clear to auscultation bilaterally.  CVS: S1, S2 no murmurs, no S3.Regular rate.  ABD: Soft non tender.   Ext: No edema  MS: Adequate ROM spine, shoulders, hips and knees.  Skin: Intact, no ulcerations or rash noted.  Psych: Good eye contact, normal affect. Memory intact not anxious or depressed appearing.  CNS: CN 2-12 intact, power,  normal throughout.no focal deficits noted.   Assessment & Plan  No problem-specific Assessment & Plan notes  found for this encounter.

## 2023-02-24 ENCOUNTER — Other Ambulatory Visit: Payer: Self-pay | Admitting: Family Medicine

## 2023-02-24 NOTE — Telephone Encounter (Signed)
Copied from CRM [id number]. Topic: Clinical - Medication Refill >> Feb 24, 2023  3:02 PM Deaijah H wrote: Most Recent Primary Care Visit:  Provider: Syliva Overman E  Department: [facility]  Visit Type: OFFICE VISIT  Date: 02/03/2023  Medication: phentermine (ADIPEX-P) 37.5 MG tablet  Has the patient contacted their pharmacy? Yes (Agent: If no, request that the patient contact the pharmacy for the refill. If patient does not wish to contact the pharmacy document the reason why and proceed with request.) (Agent: If yes, when and what did the pharmacy advise?) Today 02/24/23 Pharmacy std she is out of refills and has to been done by the doctor    Is this the correct pharmacy for this prescription? Yes If no, delete pharmacy and type the correct one.  This is the patient's preferred pharmacy:  [facility] [address] [place] [address] [place] [address] [place] [address] Phone: [phone] Fax: [phone]   Has the prescription been filled recently? No  Is the patient out of the medication? Yes  Has the patient been seen for an appointment in the last year OR does the patient have an upcoming appointment? Yes  Can we respond through MyChart? Yes  Agent: Please be advised that Rx refills may take up to 3 business days. We ask that you follow-up with your pharmacy.

## 2023-03-02 ENCOUNTER — Other Ambulatory Visit: Payer: Self-pay | Admitting: Family Medicine

## 2023-03-15 ENCOUNTER — Ambulatory Visit: Payer: Medicare HMO

## 2023-03-15 VITALS — Ht 66.0 in | Wt 193.0 lb

## 2023-03-15 DIAGNOSIS — Z0000 Encounter for general adult medical examination without abnormal findings: Secondary | ICD-10-CM

## 2023-03-15 NOTE — Patient Instructions (Addendum)
Ms. Shinnick , Thank you for taking time to come for your Medicare Wellness Visit. I appreciate your ongoing commitment to your health goals. Please review the following plan we discussed and let me know if I can assist you in the future.   Referrals/Orders/Follow-Ups/Clinician Recommendations:   This is a list of the screening recommended for you and due dates:  Health Maintenance  Topic Date Due   COVID-19 Vaccine (5 - 2023-24 season) 12/11/2022   DTaP/Tdap/Td vaccine (2 - Td or Tdap) 09/11/2023   Medicare Annual Wellness Visit  03/14/2024   Mammogram  06/08/2024   Colon Cancer Screening  09/22/2032   Pneumonia Vaccine  Completed   Flu Shot  Completed   DEXA scan (bone density measurement)  Completed   Hepatitis C Screening  Completed   Zoster (Shingles) Vaccine  Completed   HPV Vaccine  Aged Out    Advanced directives: (Declined) Advance directive discussed with you today. Even though you declined this today, please call our office should you change your mind, and we can give you the proper paperwork for you to fill out.  Next Medicare Annual Wellness Visit scheduled for next year: Yes

## 2023-03-15 NOTE — Progress Notes (Signed)
Subjective:   Teresa Arellano is a 72 y.o. female who presents for Medicare Annual (Subsequent) preventive examination.  Visit Complete: Virtual I connected with  [name] on 03/15/23 by a audio enabled telemedicine application and verified that I am speaking with the correct person using two identifiers.  Patient Location: Home  Provider Location: Home Office  I discussed the limitations of evaluation and management by telemedicine. The patient expressed understanding and agreed to proceed.  Vital Signs: Because this visit was a virtual/telehealth visit, some criteria may be missing or patient reported. Any vitals not documented were not able to be obtained and vitals that have been documented are patient reported.     Cardiac Risk Factors include: advanced age (>71men, >68 women);hypertension     Objective:    Today's Vitals   03/15/23 1117 03/15/23 1118  Weight: 193 lb (87.5 kg)   Height: 5\' 6"  (1.676 m)   PainSc:  0-No pain   Body mass index is 31.15 kg/m.     03/15/2023   11:23 AM 09/23/2022    6:55 AM 09/21/2022    1:14 PM 02/22/2022    1:34 PM 02/08/2021    2:17 PM 12/31/2020    6:27 AM 12/29/2020    1:57 PM  Advanced Directives  Does Patient Have a Medical Advance Directive? No No No No No Yes No  Would patient like information on creating a medical advance directive? No - Patient declined  No - Patient declined No - Patient declined Yes (MAU/Ambulatory/Procedural Areas - Information given) No - Patient declined No - Patient declined    Current Medications (verified) Outpatient Encounter Medications as of 03/15/2023  Medication Sig   alendronate (FOSAMAX) 70 MG tablet Take 1 tablet (70 mg total) by mouth every 7 (seven) days. Take with a full glass of water on an empty stomach.   CALCIUM PO Take 1 tablet by mouth daily after breakfast.   Cholecalciferol (VITAMIN D3) 50 MCG (2000 UT) TABS Take 2,000 Units by mouth in the morning.   phentermine  (ADIPEX-P) 37.5 MG tablet Take half tablet by mouth once daily every  morning with breakfast   phentermine (ADIPEX-P) 37.5 MG tablet Take half tablet by mouth every morning at breakfast   potassium chloride (KLOR-CON) 10 MEQ tablet Take 1 tablet by mouth once daily   triamterene-hydrochlorothiazide (MAXZIDE-25) 37.5-25 MG tablet TAKE 1 & 1/2 (ONE & ONE-HALF) TABLETS BY MOUTH ONCE DAILY FOR BLOOD PRESSURE   No facility-administered encounter medications on file as of 03/15/2023.    Allergies (verified) Patient has no known allergies.   History: Past Medical History:  Diagnosis Date   Cataract    Encounter for immunization 02/05/2023   Hypertension 2014   Past Surgical History:  Procedure Laterality Date   ABDOMINAL HYSTERECTOMY  04/11/1980   partial, fibroids   CATARACT EXTRACTION W/PHACO Right 05/02/2016   Procedure: CATARACT EXTRACTION PHACO AND INTRAOCULAR LENS PLACEMENT (IOC);  Surgeon: Gemma Payor, MD;  Location: [facility];  Service: Ophthalmology;  Laterality: Right;  CDE: 5.35   CATARACT EXTRACTION W/PHACO Left 05/23/2016   Procedure: CATARACT EXTRACTION PHACO AND INTRAOCULAR LENS PLACEMENT LEFT EYE;  Surgeon: Gemma Payor, MD;  Location: [facility];  Service: Ophthalmology;  Laterality: Left;  CDE:  5.45   CHOLECYSTECTOMY  1991 approx   COLONOSCOPY N/A 04/06/2016   Procedure: COLONOSCOPY;  Surgeon: West Bali, MD;  Location: [facility];  Service: Endoscopy;  Laterality: N/A;  10:30 Am   COLONOSCOPY WITH PROPOFOL N/A  09/23/2022   Procedure: COLONOSCOPY WITH PROPOFOL;  Surgeon: Lanelle Bal, DO;  Location: [facility];  Service: Endoscopy;  Laterality: N/A;  8:15 am, asa 2   OPEN REDUCTION INTERNAL FIXATION (ORIF) DISTAL RADIAL FRACTURE Right 12/31/2020   Procedure: OPEN REDUCTION INTERNAL FIXATION (ORIF) DISTAL RADIAL FRACTURE;  Surgeon: Oliver Barre, MD;  Location: [facility];  Service: Orthopedics;  Laterality: Right;   POLYPECTOMY  04/06/2016   Procedure: POLYPECTOMY;   Surgeon: West Bali, MD;  [facility]: [facility];  Service: Endoscopy;;  hepatic flexure polypectomy;   POLYPECTOMY  09/23/2022   Procedure: POLYPECTOMY INTESTINAL;  Surgeon: Lanelle Bal, DO;  Location: [facility];  Service: Endoscopy;;   TUBAL LIGATION  04/11/1977   Family History  Problem Relation Age of Onset   Heart disease Mother    Diabetes Father    Cancer Sister 48       "stomach", bag for urine   Cancer Sister        breast cancer   Hypertension Sister    Hypertension Sister    Diabetes Sister    Diabetes Sister    Heart disease Brother    Heart disease Brother    Social History   Socioeconomic History   Marital status: Married    Spouse name: Not on file   Number of children: Not on file   Years of education: college    Highest education level: Associate degree: occupational, Scientist, product/process development, or vocational program  Occupational History   Not on file  Tobacco Use   Smoking status: Never   Smokeless tobacco: Never  Vaping Use   Vaping status: Never Used  Substance and Sexual Activity   Alcohol use: No   Drug use: No   Sexual activity: Not Currently    Birth control/protection: Surgical  Other Topics Concern   Not on file  Social History Narrative   Not on file   Social Determinants of Health   Financial Resource Strain: Low Risk  (03/15/2023)   Overall Financial Resource Strain (CARDIA)    Difficulty of Paying Living Expenses: Not hard at all  Food Insecurity: No Food Insecurity (03/15/2023)   Hunger Vital Sign    Worried About Running Out of Food in the Last Year: Never true    Ran Out of Food in the Last Year: Never true  Transportation Needs: No Transportation Needs (03/15/2023)   PRAPARE - Administrator, Civil Service (Medical): No    Lack of Transportation (Non-Medical): No  Physical Activity: Sufficiently Active (03/15/2023)   Exercise Vital Sign    Days of Exercise per Week: 7 days    Minutes of Exercise per Session: 60 min   Stress: No Stress Concern Present (03/15/2023)   [employer] of Occupational Health - Occupational Stress Questionnaire    Feeling of Stress : Not at all  Social Connections: Socially Integrated (03/15/2023)   Social Connection and Isolation Panel [NHANES]    Frequency of Communication with Friends and Family: More than three times a week    Frequency of Social Gatherings with Friends and Family: More than three times a week    Attends Religious Services: More than 4 times per year    Active Member of [employer] or Organizations: Yes    Attends Engineer, structural: More than 4 times per year    Marital Status: Married    Tobacco Counseling Counseling given: Not Answered   Clinical Intake:  Pre-visit preparation completed: Yes  Pain : No/denies pain Pain Score: 0-No pain     BMI - recorded: 31.15 Nutritional Status: BMI > 30  Obese Nutritional Risks: None Diabetes: No  How often do you need to have someone help you when you read instructions, pamphlets, or other written materials from your doctor or pharmacy?: 1 - Never  Interpreter Needed?: No  Information entered by :: Theresa Mulligan LPN   Activities of Daily Living    03/15/2023   11:22 AM 09/21/2022    1:16 PM  In your present state of health, do you have any difficulty performing the following activities:  Hearing? 0   Vision? 0   Difficulty concentrating or making decisions? 0   Walking or climbing stairs? 0   Dressing or bathing? 0   Doing errands, shopping? 0 0  Preparing Food and eating ? N   Using the Toilet? N   In the past six months, have you accidently leaked urine? N   Do you have problems with loss of bowel control? N   Managing your Medications? N   Managing your Finances? N   Housekeeping or managing your Housekeeping? N     Patient Care Team: Kerri Perches, MD as PCP - General (Family Medicine)  Indicate any recent Medical Services you may have received from other than  [facility] providers in the past year (date may be approximate).     Assessment:   This is a routine wellness examination for Andrena.  Hearing/Vision screen Hearing Screening - Comments:: Denies hearing difficulties   Vision Screening - Comments:: Wears rx glasses - up to date with routine eye exams with  [facility]   Goals Addressed               This Visit's Progress     Lose weight (pt-stated)         Depression Screen    03/15/2023   11:21 AM 02/03/2023    8:56 AM 11/03/2022    9:10 AM 07/15/2022   11:16 AM 02/22/2022    1:35 PM 01/27/2022   11:20 AM 07/28/2021   11:16 AM  PHQ 2/9 Scores  PHQ - 2 Score 0 0 0 0 0 0 0    Fall Risk    03/15/2023   11:22 AM 02/03/2023    8:56 AM 11/03/2022    9:10 AM 07/15/2022   11:16 AM 02/22/2022    1:35 PM  Fall Risk   Falls in the past year? 0 0 0 0 0  Number falls in past yr: 0 0 0 0 0  Injury with Fall? 0 0 0 0 0  Risk for fall due to : No Fall Risks No Fall Risks No Fall Risks No Fall Risks No Fall Risks  Follow up Falls prevention discussed Falls evaluation completed Falls evaluation completed Falls evaluation completed Falls evaluation completed    MEDICARE RISK AT HOME: Medicare Risk at Home Any stairs in or around the home?: Yes If so, are there any without handrails?: No Home free of loose throw rugs in walkways, pet beds, electrical cords, etc?: Yes Adequate lighting in your home to reduce risk of falls?: Yes Life alert?: No Use of a cane, walker or w/c?: No Grab bars in the bathroom?: No Shower chair or bench in shower?: No Elevated toilet seat or a handicapped toilet?: No  TIMED UP AND GO:  Was the test performed?  No    Cognitive Function:    02/08/2021    2:18  PM  MMSE - Mini Mental State Exam  Not completed: Unable to complete        03/15/2023   11:23 AM 02/22/2022    1:36 PM 02/08/2021    2:18 PM 02/03/2020    2:12 PM 12/27/2018    1:46 PM  6CIT Screen  What Year? 0 points 0 points 0 points 0  points 0 points  What month? 0 points 0 points 0 points 0 points 0 points  What time? 0 points 0 points 0 points 0 points 0 points  Count back from 20 0 points 0 points 0 points 0 points 0 points  Months in reverse 0 points 0 points 0 points 0 points 0 points  Repeat phrase 0 points 0 points 0 points 0 points 0 points  Total Score 0 points 0 points 0 points 0 points 0 points    Immunizations Immunization History  Administered Date(s) Administered   Fluad Quad(high Dose 65+) 12/24/2018, 02/18/2020, 02/10/2021, 01/27/2022   Fluad Trivalent(High Dose 65+) 02/03/2023   Influenza,inj,Quad PF,6+ Mos 01/09/2014, 11/26/2015, 05/18/2017, 12/29/2017   PFIZER(Purple Top)SARS-COV-2 Vaccination 05/02/2019, 05/23/2019, 03/27/2020, 03/15/2021   Pneumococcal Conjugate-13 05/17/2016   Pneumococcal Polysaccharide-23 05/18/2017   Rsv, Bivalent, Protein Subunit Rsvpref,pf Verdis Frederickson) 12/22/2021   Tdap 09/10/2013   Zoster Recombinant(Shingrix) 12/22/2021, 02/16/2022   Zoster, Live 10/20/2014    TDAP status: Up to date  Flu Vaccine status: Up to date  Pneumococcal vaccine status: Up to date  Covid-19 vaccine status: Declined, Education has been provided regarding the importance of this vaccine but patient still declined. Advised may receive this vaccine at local pharmacy or Health Dept.or vaccine clinic. Aware to provide a copy of the vaccination record if obtained from local pharmacy or Health Dept. Verbalized acceptance and understanding.  Qualifies for Shingles Vaccine? Yes   Zostavax completed Yes   Shingrix Completed?: Yes  Screening Tests Health Maintenance  Topic Date Due   COVID-19 Vaccine (5 - 2023-24 season) 12/11/2022   DTaP/Tdap/Td (2 - Td or Tdap) 09/11/2023   Medicare Annual Wellness (AWV)  03/14/2024   MAMMOGRAM  06/08/2024   Colonoscopy  09/22/2032   Pneumonia Vaccine 43+ Years old  Completed   INFLUENZA VACCINE  Completed   DEXA SCAN  Completed   Hepatitis C Screening   Completed   Zoster Vaccines- Shingrix  Completed   HPV VACCINES  Aged Out    Health Maintenance  Health Maintenance Due  Topic Date Due   COVID-19 Vaccine (5 - 2023-24 season) 12/11/2022    Colorectal cancer screening: Type of screening: Colonoscopy. Completed 09/23/22. Repeat every 10 years  Mammogram status: Completed 06/08/22. Repeat every year  Bone Density status: Completed 06/08/22. Results reflect: Bone density results: OSTEOPENIA. Repeat every   years.   Additional Screening:  Hepatitis C Screening: does qualify; Completed 11/26/15  Vision Screening: Recommended annual ophthalmology exams for early detection of glaucoma and other disorders of the eye. Is the patient up to date with their annual eye exam?  Yes  Who is the provider or what is the name of the office in which the patient attends annual eye exams? [facility] If pt is not established with a provider, would they like to be referred to a provider to establish care? No .   Dental Screening: Recommended annual dental exams for proper oral hygiene    Community Resource Referral / Chronic Care Management:  CRR required this visit?  No   CCM required this visit?  No     Plan:  I have personally reviewed and noted the following in the patient's chart:   Medical and social history Use of alcohol, tobacco or illicit drugs  Current medications and supplements including opioid prescriptions. Patient is not currently taking opioid prescriptions. Functional ability and status Nutritional status Physical activity Advanced directives List of other physicians Hospitalizations, surgeries, and ER visits in previous 12 months Vitals Screenings to include cognitive, depression, and falls Referrals and appointments  In addition, I have reviewed and discussed with patient certain preventive protocols, quality metrics, and best practice recommendations. A written personalized care plan for preventive services as  well as general preventive health recommendations were provided to patient.     Tillie Rung, LPN   [id number]   After Visit Summary: (MyChart) Due to this being a telephonic visit, the after visit summary with patients personalized plan was offered to patient via MyChart   Nurse Notes: None

## 2023-04-07 ENCOUNTER — Other Ambulatory Visit: Payer: Self-pay | Admitting: Family Medicine

## 2023-05-26 ENCOUNTER — Ambulatory Visit: Payer: Medicare HMO | Admitting: Family Medicine

## 2023-06-05 ENCOUNTER — Ambulatory Visit (INDEPENDENT_AMBULATORY_CARE_PROVIDER_SITE_OTHER): Payer: Medicare HMO | Admitting: Family Medicine

## 2023-06-05 ENCOUNTER — Encounter: Payer: Self-pay | Admitting: Family Medicine

## 2023-06-05 VITALS — BP 116/76 | HR 82 | Ht 66.0 in | Wt 189.1 lb

## 2023-06-05 DIAGNOSIS — B349 Viral infection, unspecified: Secondary | ICD-10-CM | POA: Diagnosis not present

## 2023-06-05 MED ORDER — PROMETHAZINE-DM 6.25-15 MG/5ML PO SYRP
5.0000 mL | ORAL_SOLUTION | Freq: Four times a day (QID) | ORAL | 0 refills | Status: DC | PRN
Start: 2023-06-05 — End: 2023-06-30

## 2023-06-05 NOTE — Patient Instructions (Addendum)
 I appreciate the opportunity to provide care to you today!  Viral Illness:  I recommend symptomatic treatments with the following: Start taking Promethazine DM 5 mL by mouth every 4 hours as needed for cough and cold symptoms. Increase fluid intake and allow for plenty of rest. Take Tylenol as needed for pain, fever, or general discomfort. Perform warm saltwater gargles 3-4 times daily to help with throat pain or discomfort. (Mix 1/2 teaspoon of salt in a glass of warm water and gargle several times daily to reduce throat inflammation and soothe irritation.) Ginger tea: Reduces throat irritation and can help with nausea. Look for sugar-free or honey-based throat lozenges to help with a sore throat. Use a humidifier at bedtime to help with cough and nasal congestion. For nasal congestion: The use of heated humidified air is a safe and effective therapy. Saline nasal sprays may also help alleviate nasal symptoms of the common cold. For cough: Treatment with honey may reduce cough frequency and severity. Follow up if your symptoms do not improve after 7 to 10 days of symptom onset.       Please continue to a heart-healthy diet and increase your physical activities. Try to exercise for at least five days a week.    It was a pleasure to see you and I look forward to continuing to work together on your health and well-being. Please do not hesitate to call the office if you need care or have questions about your care.  In case of emergency, please visit the Emergency Department for urgent care, or contact our clinic at [phone] to schedule an appointment. We're here to help you!   Have a wonderful day and week. With Gratitude, Gilmore Laroche MSN, FNP-BC

## 2023-06-05 NOTE — Progress Notes (Signed)
 Acute Office Visit  Subjective:    Patient ID: Teresa Arellano, female    DOB: [birth year], 73 y.o.   MRN: [id number]  Chief Complaint  Patient presents with   Acute Visit    Cough, runny nose body aches since tuesday    HPI The patient presents today with complaints of a dry cough, body aches, and loss of appetite since 05/29/2022. She denies nasal congestion, sneezing, runny nose, facial pain and pressure, nausea, vomiting, diarrhea, fever, chills, and sore throat. She has been self-treating with over-the-counter Mucinex but reports minimal relief.  Past Medical History:  Diagnosis Date   Cataract    Encounter for immunization 02/05/2023   Hypertension 2014    Past Surgical History:  Procedure Laterality Date   ABDOMINAL HYSTERECTOMY  04/11/1980   partial, fibroids   CATARACT EXTRACTION W/PHACO Right 05/02/2016   Procedure: CATARACT EXTRACTION PHACO AND INTRAOCULAR LENS PLACEMENT (IOC);  Surgeon: Gemma Payor, MD;  Location: [facility];  Service: Ophthalmology;  Laterality: Right;  CDE: 5.35   CATARACT EXTRACTION W/PHACO Left 05/23/2016   Procedure: CATARACT EXTRACTION PHACO AND INTRAOCULAR LENS PLACEMENT LEFT EYE;  Surgeon: Gemma Payor, MD;  Location: [facility];  Service: Ophthalmology;  Laterality: Left;  CDE:  5.45   CHOLECYSTECTOMY  1991 approx   COLONOSCOPY N/A 04/06/2016   Procedure: COLONOSCOPY;  Surgeon: West Bali, MD;  [facility];  Service: Endoscopy;  Laterality: N/A;  10:30 Am   COLONOSCOPY WITH PROPOFOL N/A 09/23/2022   Procedure: COLONOSCOPY WITH PROPOFOL;  Surgeon: Lanelle Bal, DO;  [facility];  Service: Endoscopy;  Laterality: N/A;  8:15 am, asa 2   OPEN REDUCTION INTERNAL FIXATION (ORIF) DISTAL RADIAL FRACTURE Right 12/31/2020   Procedure: OPEN REDUCTION INTERNAL FIXATION (ORIF) DISTAL RADIAL FRACTURE;  Surgeon: Oliver Barre, MD;  Location: [facility];  Service: Orthopedics;  Laterality: Right;   POLYPECTOMY  04/06/2016   Procedure:  POLYPECTOMY;  Surgeon: West Bali, MD;  [facility];  Service: Endoscopy;;  hepatic flexure polypectomy;   POLYPECTOMY  09/23/2022   Procedure: POLYPECTOMY INTESTINAL;  Surgeon: Lanelle Bal, DO;  [facility];  Service: Endoscopy;;   TUBAL LIGATION  04/11/1977    Family History  Problem Relation Age of Onset   Heart disease Mother    Diabetes Father    Cancer Sister 56       "stomach", bag for urine   Cancer Sister        breast cancer   Hypertension Sister    Hypertension Sister    Diabetes Sister    Diabetes Sister    Heart disease Brother    Heart disease Brother     Social History   Socioeconomic History   Marital status: Married    Spouse name: Not on file   Number of children: Not on file   Years of education: college    Highest education level: Associate degree: occupational, Scientist, product/process development, or vocational program  Occupational History   Not on file  Tobacco Use   Smoking status: Never   Smokeless tobacco: Never  Vaping Use   Vaping status: Never Used  Substance and Sexual Activity   Alcohol use: No   Drug use: No   Sexual activity: Not Currently    Birth control/protection: Surgical  Other Topics Concern   Not on file  Social History Narrative   Not on file   Social Drivers of Health   Financial Resource Strain: Low Risk  (  03/15/2023)   Overall Financial Resource Strain (CARDIA)    Difficulty of Paying Living Expenses: Not hard at all  Food Insecurity: No Food Insecurity (03/15/2023)   Hunger Vital Sign    Worried About Running Out of Food in the Last Year: Never true    Ran Out of Food in the Last Year: Never true  Transportation Needs: No Transportation Needs (03/15/2023)   PRAPARE - Administrator, Civil Service (Medical): No    Lack of Transportation (Non-Medical): No  Physical Activity: Sufficiently Active (03/15/2023)   Exercise Vital Sign    Days of Exercise per Week: 7 days    Minutes of Exercise per  Session: 60 min  Stress: No Stress Concern Present (03/15/2023)   [employer] of Occupational Health - Occupational Stress Questionnaire    Feeling of Stress : Not at all  Social Connections: Socially Integrated (03/15/2023)   Social Connection and Isolation Panel [NHANES]    Frequency of Communication with Friends and Family: More than three times a week    Frequency of Social Gatherings with Friends and Family: More than three times a week    Attends Religious Services: More than 4 times per year    Active Member of [employer] or Organizations: Yes    Attends Engineer, structural: More than 4 times per year    Marital Status: Married  Catering manager Violence: Not At Risk (03/15/2023)   Humiliation, Afraid, Rape, and Kick questionnaire    Fear of Current or Ex-Partner: No    Emotionally Abused: No    Physically Abused: No    Sexually Abused: No    Outpatient Medications Prior to Visit  Medication Sig Dispense Refill   alendronate (FOSAMAX) 70 MG tablet Take 1 tablet (70 mg total) by mouth every 7 (seven) days. Take with a full glass of water on an empty stomach. 4 tablet 11   CALCIUM PO Take 1 tablet by mouth daily after breakfast.     Cholecalciferol (VITAMIN D3) 50 MCG (2000 UT) TABS Take 2,000 Units by mouth in the morning.     phentermine (ADIPEX-P) 37.5 MG tablet Take half tablet by mouth once daily every  morning with breakfast 30 tablet 1   phentermine (ADIPEX-P) 37.5 MG tablet Take half tablet by mouth every morning at breakfast 30 tablet 1   potassium chloride (KLOR-CON) 10 MEQ tablet Take 1 tablet by mouth once daily 90 tablet 0   triamterene-hydrochlorothiazide (MAXZIDE-25) 37.5-25 MG tablet TAKE 1 & 1/2 (ONE & ONE-HALF) TABLETS BY MOUTH ONCE DAILY FOR BLOOD PRESSURE 135 tablet 0   No facility-administered medications prior to visit.    No Known Allergies  Review of Systems  Constitutional:  Negative for chills and fever.  HENT:  Negative for sinus pressure  and sore throat.   Eyes:  Negative for visual disturbance.  Respiratory:  Positive for cough. Negative for chest tightness and shortness of breath.   Neurological:  Negative for dizziness and headaches.       Objective:    Physical Exam HENT:     Head: Normocephalic.     Mouth/Throat:     Mouth: Mucous membranes are moist.  Cardiovascular:     Rate and Rhythm: Normal rate.     Heart sounds: Normal heart sounds.  Pulmonary:     Effort: Pulmonary effort is normal.     Breath sounds: Normal breath sounds.  Neurological:     Mental Status: She is alert.  BP 116/76   Pulse 82   Ht 5\' 6"  (1.676 m)   Wt 189 lb 1.3 oz (85.8 kg)   SpO2 98%   BMI 30.52 kg/m  Wt Readings from Last 3 Encounters:  06/05/23 189 lb 1.3 oz (85.8 kg)  03/15/23 193 lb (87.5 kg)  02/03/23 193 lb 0.6 oz (87.6 kg)       Assessment & Plan:  Viral illness Assessment & Plan: Likely viral illness or post-viral cough  Supportive Care & Symptom Management:  Continue Mucinex as needed to help loosen mucus. Increase fluid intake to stay hydrated and help with recovery. Rest and avoid strenuous activity until symptoms improve. Consider using a humidifier to help with dry cough. For pain or body aches, take Tylenol or ibuprofen as needed if no contraindications. Follow-Up Instructions: Return for evaluation if symptoms persist beyond 10-14 days, worsen, or if new  Orders: -     Promethazine-DM; Take 5 mLs by mouth 4 (four) times daily as needed.  Dispense: 118 mL; Refill: 0 -     COVID-19, Flu A+B and RSV   Note: This chart has been completed using Engineer, civil (consulting) software, and while attempts have been made to ensure accuracy, certain words and phrases may not be transcribed as intended.   Gilmore Laroche, FNP

## 2023-06-05 NOTE — Assessment & Plan Note (Signed)
 Likely viral illness or post-viral cough  Supportive Care & Symptom Management:  Continue Mucinex as needed to help loosen mucus. Increase fluid intake to stay hydrated and help with recovery. Rest and avoid strenuous activity until symptoms improve. Consider using a humidifier to help with dry cough. For pain or body aches, take Tylenol or ibuprofen as needed if no contraindications. Follow-Up Instructions: Return for evaluation if symptoms persist beyond 10-14 days, worsen, or if new

## 2023-06-07 ENCOUNTER — Other Ambulatory Visit: Payer: Self-pay | Admitting: Family Medicine

## 2023-06-07 DIAGNOSIS — J111 Influenza due to unidentified influenza virus with other respiratory manifestations: Secondary | ICD-10-CM

## 2023-06-07 LAB — COVID-19, FLU A+B AND RSV
Influenza A, NAA: DETECTED — AB
Influenza B, NAA: NOT DETECTED
RSV, NAA: NOT DETECTED
SARS-CoV-2, NAA: NOT DETECTED

## 2023-06-07 MED ORDER — OSELTAMIVIR PHOSPHATE 75 MG PO CAPS
75.0000 mg | ORAL_CAPSULE | Freq: Two times a day (BID) | ORAL | 0 refills | Status: AC
Start: 2023-06-07 — End: 2023-06-12

## 2023-06-07 NOTE — Progress Notes (Signed)
 Please inform the patient that she tested positive for the flu and that a prescription for Tamiflu 75 mg, to be taken twice daily for five days, has been sent to her pharmacy for her to start taking.

## 2023-06-12 ENCOUNTER — Encounter (HOSPITAL_COMMUNITY): Payer: Self-pay

## 2023-06-12 ENCOUNTER — Ambulatory Visit (HOSPITAL_COMMUNITY)
Admission: RE | Admit: 2023-06-12 | Discharge: 2023-06-12 | Disposition: A | Discharge status: Home / Self Care | Payer: Medicare HMO | Source: Ambulatory Visit | Attending: Family Medicine | Admitting: Family Medicine

## 2023-06-12 DIAGNOSIS — Z1231 Encounter for screening mammogram for malignant neoplasm of breast: Secondary | ICD-10-CM | POA: Diagnosis not present

## 2023-06-30 ENCOUNTER — Ambulatory Visit (INDEPENDENT_AMBULATORY_CARE_PROVIDER_SITE_OTHER): Payer: Medicare HMO | Admitting: Family Medicine

## 2023-06-30 VITALS — BP 119/75 | HR 89 | Resp 16 | Ht 66.0 in | Wt 195.0 lb

## 2023-06-30 DIAGNOSIS — I10 Essential (primary) hypertension: Secondary | ICD-10-CM | POA: Diagnosis not present

## 2023-06-30 DIAGNOSIS — R195 Other fecal abnormalities: Secondary | ICD-10-CM

## 2023-06-30 DIAGNOSIS — E66811 Obesity, class 1: Secondary | ICD-10-CM | POA: Diagnosis not present

## 2023-06-30 DIAGNOSIS — E559 Vitamin D deficiency, unspecified: Secondary | ICD-10-CM

## 2023-06-30 NOTE — Patient Instructions (Signed)
 Annual exam in 6 months, call if you need me before  Weight loss goal of 6 pounds  Please get fasting labs next Monday( nurse please reprint lab order  No med ication changes  It is important that you exercise regularly at least 30 minutes 5 times a week. If you develop chest pain, have severe difficulty breathing, or feel very tired, stop exercising immediately and seek medical attention   Thanks for choosing [facility], we consider it a privelige to serve you.

## 2023-07-02 ENCOUNTER — Other Ambulatory Visit: Payer: Self-pay | Admitting: Family Medicine

## 2023-07-02 DIAGNOSIS — B349 Viral infection, unspecified: Secondary | ICD-10-CM

## 2023-07-03 DIAGNOSIS — I10 Essential (primary) hypertension: Secondary | ICD-10-CM | POA: Diagnosis not present

## 2023-07-03 DIAGNOSIS — E559 Vitamin D deficiency, unspecified: Secondary | ICD-10-CM | POA: Diagnosis not present

## 2023-07-03 DIAGNOSIS — Z13220 Encounter for screening for lipoid disorders: Secondary | ICD-10-CM | POA: Diagnosis not present

## 2023-07-04 ENCOUNTER — Encounter: Payer: Self-pay | Admitting: Family Medicine

## 2023-07-04 LAB — CMP14+EGFR
ALT: 11 IU/L (ref 0–32)
AST: 19 IU/L (ref 0–40)
Albumin: 4.1 g/dL (ref 3.8–4.8)
Alkaline Phosphatase: 70 IU/L (ref 44–121)
BUN/Creatinine Ratio: 11 — ABNORMAL LOW (ref 12–28)
BUN: 8 mg/dL (ref 8–27)
Bilirubin Total: 0.4 mg/dL (ref 0.0–1.2)
CO2: 26 mmol/L (ref 20–29)
Calcium: 9.5 mg/dL (ref 8.7–10.3)
Chloride: 105 mmol/L (ref 96–106)
Creatinine, Ser: 0.74 mg/dL (ref 0.57–1.00)
Globulin, Total: 3.3 g/dL (ref 1.5–4.5)
Glucose: 92 mg/dL (ref 70–99)
Potassium: 4.3 mmol/L (ref 3.5–5.2)
Sodium: 141 mmol/L (ref 134–144)
Total Protein: 7.4 g/dL (ref 6.0–8.5)
eGFR: 86 mL/min/{1.73_m2} (ref >59)

## 2023-07-04 LAB — TSH: TSH: 0.749 u[IU]/mL (ref 0.450–4.500)

## 2023-07-04 LAB — CBC
Hematocrit: 39.3 % (ref 34.0–46.6)
Hemoglobin: 12.4 g/dL (ref 11.1–15.9)
MCH: 26.1 pg — ABNORMAL LOW (ref 26.6–33.0)
MCHC: 31.6 g/dL (ref 31.5–35.7)
MCV: 83 fL (ref 79–97)
Platelets: 339 10*3/uL (ref 150–450)
RBC: 4.75 x10E6/uL (ref 3.77–5.28)
RDW: 13.9 % (ref 11.7–15.4)
WBC: 7.2 10*3/uL (ref 3.4–10.8)

## 2023-07-04 LAB — VITAMIN D 25 HYDROXY (VIT D DEFICIENCY, FRACTURES): Vit D, 25-Hydroxy: 32.3 ng/mL (ref 30.0–100.0)

## 2023-07-04 LAB — LIPID PANEL
Chol/HDL Ratio: 2.7 ratio (ref 0.0–4.4)
Cholesterol, Total: 166 mg/dL (ref 100–199)
HDL: 61 mg/dL (ref >39)
LDL Chol Calc (NIH): 92 mg/dL (ref 0–99)
Triglycerides: 67 mg/dL (ref 0–149)
VLDL Cholesterol Cal: 13 mg/dL (ref 5–40)

## 2023-07-05 ENCOUNTER — Other Ambulatory Visit: Payer: Self-pay | Admitting: Family Medicine

## 2023-07-07 ENCOUNTER — Other Ambulatory Visit: Payer: Self-pay | Admitting: Family Medicine

## 2023-07-21 ENCOUNTER — Encounter: Payer: Self-pay | Admitting: Family Medicine

## 2023-07-21 NOTE — Assessment & Plan Note (Signed)
  Patient re-educated about  the importance of commitment to a  minimum of 150 minutes of exercise per week as able.  The importance of healthy food choices with portion control discussed, as well as eating regularly and within a 12 hour window most days. The need to choose "clean , [place]" food 50 to 75% of the time is discussed, as well as to make water the primary drink and set a goal of 64 ounces water daily.       06/30/2023    4:00 PM 06/05/2023    8:20 AM 03/15/2023   11:17 AM  Weight /BMI  Weight 195 lb 189 lb 1.3 oz 193 lb  Height 5\' 6"  (1.676 m) 5\' 6"  (1.676 m) 5\' 6"  (1.676 m)  BMI 31.47 kg/m2 30.52 kg/m2 31.15 kg/m2

## 2023-07-21 NOTE — Assessment & Plan Note (Signed)
 Updated lab shows adequate crrection

## 2023-07-21 NOTE — Assessment & Plan Note (Signed)
 Controlled, no change in medication DASH diet and commitment to daily physical activity for a minimum of 30 minutes discussed and encouraged, as a part of hypertension management. The importance of attaining a healthy weight is also discussed.     06/30/2023    4:00 PM 06/05/2023    8:20 AM 03/15/2023   11:17 AM 02/03/2023    8:56 AM 11/03/2022    9:09 AM 09/23/2022    8:21 AM 09/23/2022    7:06 AM  BP/Weight  Systolic BP 119 116 -- 119 121 114 134  Diastolic BP 75 76 -- 77 80 50 77  Wt. (Lbs) 195 189.08 193 193.04 202.12  204  BMI 31.47 kg/m2 30.52 kg/m2 31.15 kg/m2 31.16 kg/m2 32.62 kg/m2  32.93 kg/m2

## 2023-07-21 NOTE — Progress Notes (Signed)
 Teresa Arellano     MRN: [id number]      DOB: 20-Mar-1951  Chief Complaint  Patient presents with   Hypertension    Follow up visit     HPI Teresa Arellano is here for follow up and re-evaluation of chronic medical conditions, medication management and review of any available recent lab and radiology data.  Preventive health is updated, specifically  Cancer screening and Immunization.   Questions or concerns regarding consultations or procedures which the PT has had in the interim are  addressed. The PT denies any adverse reactions to current medications since the last visit.  There are no new concerns.  There are no specific complaints   ROS Denies recent fever or chills. Denies sinus pressure, nasal congestion, ear pain or sore throat. Denies chest congestion, productive cough or wheezing. Denies chest pains, palpitations and leg swelling Denies abdominal pain, nausea, vomiting,diarrhea or constipation.   Denies dysuria, frequency, hesitancy or incontinence. Denies joint pain, swelling and limitation in mobility. Denies headaches, seizures, numbness, or tingling. Denies depression, anxiety or insomnia. Denies skin break down or rash.   PE  BP 119/75   Pulse 89   Resp 16   Ht 5\' 6"  (1.676 m)   Wt 195 lb (88.5 kg)   SpO2 98%   BMI 31.47 kg/m   Patient alert and oriented and in no cardiopulmonary distress.  HEENT: No facial asymmetry, EOMI,     Neck supple .  Chest: Clear to auscultation bilaterally.  CVS: S1, S2 no murmurs, no S3.Regular rate.  ABD: Soft non tender.   Ext: No edema  MS: Adequate ROM spine, shoulders, hips and knees.  Skin: Intact, no ulcerations or rash noted.  Psych: Good eye contact, normal affect. Memory intact not anxious or depressed appearing.  CNS: CN 2-12 intact, power,  normal throughout.no focal deficits noted.   Assessment & Plan  Essential hypertension, benign Controlled, no change in medication DASH diet and commitment to  daily physical activity for a minimum of 30 minutes discussed and encouraged, as a part of hypertension management. The importance of attaining a healthy weight is also discussed.     06/30/2023    4:00 PM 06/05/2023    8:20 AM 03/15/2023   11:17 AM 02/03/2023    8:56 AM 11/03/2022    9:09 AM 09/23/2022    8:21 AM 09/23/2022    7:06 AM  BP/Weight  Systolic BP 119 116 -- 119 121 114 134  Diastolic BP 75 76 -- 77 80 50 77  Wt. (Lbs) 195 189.08 193 193.04 202.12  204  BMI 31.47 kg/m2 30.52 kg/m2 31.15 kg/m2 31.16 kg/m2 32.62 kg/m2  32.93 kg/m2       Obesity (BMI 30.0-34.9)  Patient re-educated about  the importance of commitment to a  minimum of 150 minutes of exercise per week as able.  The importance of healthy food choices with portion control discussed, as well as eating regularly and within a 12 hour window most days. The need to choose "clean , [place]" food 50 to 75% of the time is discussed, as well as to make water the primary drink and set a goal of 64 ounces water daily.       06/30/2023    4:00 PM 06/05/2023    8:20 AM 03/15/2023   11:17 AM  Weight /BMI  Weight 195 lb 189 lb 1.3 oz 193 lb  Height 5\' 6"  (1.676 m) 5\' 6"  (1.676 m) 5\' 6"  (1.676 m)  BMI 31.47 kg/m2 30.52 kg/m2 31.15 kg/m2      Vitamin D deficiency Updated lab shows adequate crrection

## 2023-08-16 ENCOUNTER — Other Ambulatory Visit: Payer: Self-pay | Admitting: Family Medicine

## 2023-12-29 ENCOUNTER — Emergency Department (HOSPITAL_COMMUNITY)
Admission: EM | Admit: 2023-12-29 | Discharge: 2023-12-29 | Disposition: A | Discharge status: Home / Self Care | Attending: Emergency Medicine | Admitting: Emergency Medicine

## 2023-12-29 ENCOUNTER — Encounter (HOSPITAL_COMMUNITY): Payer: Self-pay | Admitting: Emergency Medicine

## 2023-12-29 DIAGNOSIS — H5712 Ocular pain, left eye: Secondary | ICD-10-CM | POA: Diagnosis not present

## 2023-12-29 NOTE — Discharge Instructions (Signed)
 Follow-up with your eye doctor next week if not improving, and return to the ER if your symptoms significantly worsen or change.

## 2023-12-29 NOTE — ED Provider Notes (Signed)
 [place] EMERGENCY DEPARTMENT AT [facility] Provider Note   CSN: [id number] Arrival date & time: 12/29/23  [id number]     Patient presents with: Eye Pain   Teresa Arellano is a 73 y.o. female.   Patient is a 73 year old female with history of hypertension and prior cataract surgery.  Patient presenting today with complaints of eye discomfort.  She woke this morning with a sensation of a pressure behind her left eye.  This began in the absence of any injury or trauma.  She denies any visual disturbances.       Prior to Admission medications   Medication Sig Start Date End Date Taking? Authorizing Provider  alendronate  (FOSAMAX ) 70 MG tablet Take 1 tablet (70 mg total) by mouth every 7 (seven) days. Take with a full glass of water  on an empty stomach. 02/03/23   Antonetta Rollene BRAVO, MD  CALCIUM PO Take 1 tablet by mouth daily after breakfast.    [provider]  Cholecalciferol (VITAMIN D3) 50 MCG (2000 UT) TABS Take 2,000 Units by mouth in the morning.    [provider]  phentermine  (ADIPEX-P ) 37.5 MG tablet Take half tablet by mouth once daily every  morning with breakfast 11/03/22   Antonetta Rollene BRAVO, MD  potassium chloride  (KLOR-CON ) 10 MEQ tablet Take 1 tablet by mouth once daily 08/17/23   Antonetta Rollene BRAVO, MD  triamterene -hydrochlorothiazide (MAXZIDE-25) 37.5-25 MG tablet TAKE 1 & 1/2 (ONE & ONE-HALF) TABLETS BY MOUTH ONCE DAILY FOR BLOOD PRESSURE 08/17/23   Antonetta Rollene BRAVO, MD    Allergies: Patient has no known allergies.    Review of Systems  All other systems reviewed and are negative.   Updated Vital Signs BP 128/85 (BP Location: Right Arm)   Pulse 80   Temp 97.7 F (36.5 C) (Oral)   Resp 17   Ht 5' 6 (1.676 m)   Wt 83 kg   SpO2 98%   BMI 29.54 kg/m   Physical Exam Vitals and nursing note reviewed.  Constitutional:      Appearance: Normal appearance.  Eyes:     Extraocular Movements: Extraocular movements intact.      Pupils: Pupils are equal, round, and reactive to light.     Comments: The left eye is grossly normal in appearance.  I am unable to appreciate any abnormalities with the sclera, conjunctiva, or cornea.  The anterior chamber is clear and pupil is reactive.  I see no foreign bodies under the eyelids and I see no corneal abrasion with fluorescein staining.  Eye pressures also measured with the Tono-Pen and are 16.  Pulmonary:     Effort: Pulmonary effort is normal.  Skin:    General: Skin is warm and dry.  Neurological:     Mental Status: She is alert and oriented to person, place, and time.     (all labs ordered are listed, but only abnormal results are displayed) Labs Reviewed - No data to display  EKG: None  Radiology: No results found.   Procedures   Medications Ordered in the ED - No data to display                                  Medical Decision Making  Patient presenting with pressure behind her left eye, the etiology of which I am uncertain.  Her exam is grossly normal including eye pressures, fluorescein staining, and funduscopic exam.  She is complaining of no visual disturbances.  At this point, I feel as though I have given her a thorough exam with what I have here in the ER but I found no obvious cause.  There is no proptosis and I highly doubt any retrobulbar abnormality.  I feel as though patient can safely be discharged with as needed return.     Final diagnoses:  None    ED Discharge Orders     None          Geroldine Berg, MD 12/29/23 [phone]

## 2023-12-29 NOTE — ED Triage Notes (Addendum)
 Pt states left eye is painful, hard to shut, feels like something in there. Started yesterday with running in outer corner. Has not put anything in it. No trauma or foreign body she remembers. She sees eye doc at practice in [place] called [facility].

## 2024-01-01 ENCOUNTER — Other Ambulatory Visit: Payer: Self-pay | Admitting: Family Medicine

## 2024-01-03 ENCOUNTER — Encounter: Admitting: Family Medicine

## 2024-02-19 ENCOUNTER — Other Ambulatory Visit: Payer: Self-pay | Admitting: Family Medicine

## 2024-03-12 DIAGNOSIS — H5202 Hypermetropia, left eye: Secondary | ICD-10-CM | POA: Diagnosis not present

## 2024-03-12 DIAGNOSIS — H52222 Regular astigmatism, left eye: Secondary | ICD-10-CM | POA: Diagnosis not present

## 2024-03-12 DIAGNOSIS — H5211 Myopia, right eye: Secondary | ICD-10-CM | POA: Diagnosis not present

## 2024-03-12 DIAGNOSIS — H524 Presbyopia: Secondary | ICD-10-CM | POA: Diagnosis not present

## 2024-03-18 ENCOUNTER — Ambulatory Visit

## 2024-03-18 VITALS — Ht 66.0 in | Wt 180.0 lb

## 2024-03-18 DIAGNOSIS — Z0000 Encounter for general adult medical examination without abnormal findings: Secondary | ICD-10-CM

## 2024-03-18 NOTE — Progress Notes (Signed)
 Chief Complaint  Patient presents with   Medicare Wellness     Subjective:   Teresa Arellano is a 73 y.o. female who presents for a Medicare Annual Wellness Visit.  Visit info / Clinical Intake: Medicare Wellness Visit Type:: Subsequent Annual Wellness Visit Persons participating in visit and providing information:: patient Medicare Wellness Visit Mode:: Telephone If telephone:: video declined Since this visit was completed virtually, some vitals may be partially provided or unavailable. Missing vitals are due to the limitations of the virtual format.: Documented vitals are patient reported If Telephone or Video please confirm:: I connected with patient using audio/video enable telemedicine. I verified patient identity with two identifiers, discussed telehealth limitations, and patient agreed to proceed. Patient Location:: work Dispensing Optician:: office Interpreter Needed?: No Pre-visit prep was completed: yes AWV questionnaire completed by patient prior to visit?: no Living arrangements:: lives with spouse/significant other Patient's Overall Health Status Rating: excellent Typical amount of pain: none Does pain affect daily life?: no Are you currently prescribed opioids?: no  Dietary Habits and Nutritional Risks How many meals a day?: 2 Eats fruit and vegetables daily?: yes Most meals are obtained by: preparing own meals In the last 2 weeks, have you had any of the following?: none Diabetic:: no  Functional Status Activities of Daily Living (to include ambulation/medication): Independent Ambulation: Independent Medication Administration: Independent Manage your own finances?: yes Primary transportation is: driving Concerns about vision?: no *vision screening is required for WTM* Concerns about hearing?: no  Fall Screening Falls in the past year?: 0 Number of falls in past year: 0 Was there an injury with Fall?: 0 Fall Risk Category Calculator: 0 Patient Fall Risk  Level: Low Fall Risk  Fall Risk Patient at Risk for Falls Due to: No Fall Risks Fall risk Follow up: Falls evaluation completed; Education provided; Falls prevention discussed  Home and Transportation Safety: All rugs have non-skid backing?: yes All stairs or steps have railings?: yes Grab bars in the bathtub or shower?: yes Have non-skid surface in bathtub or shower?: yes Good home lighting?: yes Regular seat belt use?: yes Hospital stays in the last year:: no  Cognitive Assessment Difficulty concentrating, remembering, or making decisions? : no Will 6CIT or Mini Cog be Completed: no 6CIT or Mini Cog Declined: patient alert, oriented, able to answer questions appropriately and recall recent events  Advance Directives (For Healthcare) Does Patient Have a Medical Advance Directive?: No Would patient like information on creating a medical advance directive?: Yes (MAU/Ambulatory/Procedural Areas - Information given)  Reviewed/Updated  Reviewed/Updated: Reviewed All (Medical, Surgical, Family, Medications, Allergies, Care Teams, Patient Goals)    Allergies (verified) Patient has no known allergies.   Current Medications (verified) Outpatient Encounter Medications as of 03/18/2024  Medication Sig   alendronate  (FOSAMAX ) 70 MG tablet TAKE 1 TABLET BY MOUTH ONCE A WEEK. TAKE WITH A FULL GLASS OF WATER  ON AN EMPTY STOMACH.   CALCIUM PO Take 1 tablet by mouth daily after breakfast.   Cholecalciferol (VITAMIN D3) 50 MCG (2000 UT) TABS Take 2,000 Units by mouth in the morning.   phentermine  (ADIPEX-P ) 37.5 MG tablet Take half tablet by mouth once daily every  morning with breakfast   potassium chloride  (KLOR-CON ) 10 MEQ tablet Take 1 tablet by mouth once daily   triamterene -hydrochlorothiazide (MAXZIDE-25) 37.5-25 MG tablet TAKE 1 & 1/2 TABLETS BY MOUTH ONCE DAILY FOR BLOOD PRESSURE   No facility-administered encounter medications on file as of 03/18/2024.    History: Past Medical  History:  Diagnosis Date   Cataract    Encounter for immunization 02/05/2023   Hypertension 2014   Past Surgical History:  Procedure Laterality Date   ABDOMINAL HYSTERECTOMY  04/11/1980   partial, fibroids   CATARACT EXTRACTION W/PHACO Right 05/02/2016   Procedure: CATARACT EXTRACTION PHACO AND INTRAOCULAR LENS PLACEMENT (IOC);  Surgeon: Cherene Mania, MD;  Location: [facility];  Service: Ophthalmology;  Laterality: Right;  CDE: 5.35   CATARACT EXTRACTION W/PHACO Left 05/23/2016   Procedure: CATARACT EXTRACTION PHACO AND INTRAOCULAR LENS PLACEMENT LEFT EYE;  Surgeon: Cherene Mania, MD;  Location: [facility];  Service: Ophthalmology;  Laterality: Left;  CDE:  5.45   CHOLECYSTECTOMY  1991 approx   COLONOSCOPY N/A 04/06/2016   Procedure: COLONOSCOPY;  Surgeon: Margo LITTIE Haddock, MD;  Location: [facility];  Service: Endoscopy;  Laterality: N/A;  10:30 Am   COLONOSCOPY WITH PROPOFOL  N/A 09/23/2022   Procedure: COLONOSCOPY WITH PROPOFOL ;  Surgeon: Cindie Carlin POUR, DO;  Location: [facility];  Service: Endoscopy;  Laterality: N/A;  8:15 am, asa 2   OPEN REDUCTION INTERNAL FIXATION (ORIF) DISTAL RADIAL FRACTURE Right 12/31/2020   Procedure: OPEN REDUCTION INTERNAL FIXATION (ORIF) DISTAL RADIAL FRACTURE;  Surgeon: Onesimo Oneil LABOR, MD;  Location: [facility];  Service: Orthopedics;  Laterality: Right;   POLYPECTOMY  04/06/2016   Procedure: POLYPECTOMY;  Surgeon: Margo LITTIE Haddock, MD;  Location: [facility];  Service: Endoscopy;;  hepatic flexure polypectomy;   POLYPECTOMY  09/23/2022   Procedure: POLYPECTOMY INTESTINAL;  Surgeon: Cindie Carlin POUR, DO;  Location: [facility];  Service: Endoscopy;;   TUBAL LIGATION  04/11/1977   Family History  Problem Relation Age of Onset   Heart disease Mother    Diabetes Father    Cancer Sister 9       stomach, bag for urine   Cancer Sister        breast cancer   Hypertension Sister    Hypertension Sister    Diabetes Sister    Diabetes Sister    Heart disease  Brother    Heart disease Brother    Social History   Occupational History   Not on file  Tobacco Use   Smoking status: Never   Smokeless tobacco: Never  Vaping Use   Vaping status: Never Used  Substance and Sexual Activity   Alcohol use: No   Drug use: No   Sexual activity: Not Currently    Birth control/protection: Surgical   Tobacco Counseling Counseling given: Yes  SDOH Screenings   Food Insecurity: No Food Insecurity (03/18/2024)  Housing: Low Risk  (03/18/2024)  Transportation Needs: No Transportation Needs (03/18/2024)  Utilities: Not At Risk (03/18/2024)  Alcohol Screen: Low Risk  (03/15/2023)  Depression (PHQ2-9): Low Risk  (03/18/2024)  Financial Resource Strain: Low Risk  (06/30/2023)  Physical Activity: Sufficiently Active (03/18/2024)  Social Connections: Socially Integrated (03/18/2024)  Stress: No Stress Concern Present (03/18/2024)  Tobacco Use: Low Risk  (03/18/2024)  Health Literacy: Adequate Health Literacy (03/18/2024)   See flowsheets for full screening details  Depression Screen PHQ 2 & 9 Depression Scale- Over the past 2 weeks, how often have you been bothered by any of the following problems? Little interest or pleasure in doing things: 0 Feeling down, depressed, or hopeless (PHQ Adolescent also includes...irritable): 0 PHQ-2 Total Score: 0 Trouble falling or staying asleep, or sleeping too much: 0 Feeling tired or having little energy: 0 Poor appetite or overeating (PHQ Adolescent also includes...weight loss): 0 Feeling bad about yourself - or  that you are a failure or have let yourself or your family down: 0 Trouble concentrating on things, such as reading the newspaper or watching television (PHQ Adolescent also includes...like school work): 0 Moving or speaking so slowly that other people could have noticed. Or the opposite - being so fidgety or restless that you have been moving around a lot more than usual: 0 Thoughts that you would be better off  dead, or of hurting yourself in some way: 0 PHQ-9 Total Score: 0 If you checked off any problems, how difficult have these problems made it for you to do your work, take care of things at home, or get along with other people?: Not difficult at all     Goals Addressed             This Visit's Progress    Patient Stated   On track    Lose weight.             Objective:    Today's Vitals   03/18/24 1051  Weight: 180 lb (81.6 kg)  Height: 5' 6 (1.676 m)   Body mass index is 29.05 kg/m.  Hearing/Vision screen Hearing Screening - Comments:: Patient denies any hearing difficulties.   Vision Screening - Comments:: Wears rx glasses - up to date with routine eye exams with  [facility] location  Immunizations and Health Maintenance Health Maintenance  Topic Date Due   DTaP/Tdap/Td (2 - Td or Tdap) 09/11/2023   Influenza Vaccine  11/10/2023   COVID-19 Vaccine (5 - 2025-26 season) 12/11/2023   Medicare Annual Wellness (AWV)  03/14/2024   Bone Density Scan  06/08/2024   Mammogram  06/11/2024   Colonoscopy  09/23/2027   Pneumococcal Vaccine: 50+ Years  Completed   Hepatitis C Screening  Completed   Zoster Vaccines- Shingrix  Completed   Meningococcal B Vaccine  Aged Out        Assessment/Plan:  This is a routine wellness examination for Teresa Arellano.  Patient Care Team: Antonetta Rollene BRAVO, MD as PCP - General (Family Medicine) [facility] (Optometry) Cindie, Carlin POUR, DO as Consulting Physician (Gastroenterology)  I have personally reviewed and noted the following in the patient's chart:   Medical and social history Use of alcohol, tobacco or illicit drugs  Current medications and supplements including opioid prescriptions. Functional ability and status Nutritional status Physical activity Advanced directives List of other physicians Hospitalizations, surgeries, and ER visits in previous 12 months Vitals Screenings to  include cognitive, depression, and falls Referrals and appointments  No orders of the defined types were placed in this encounter.  In addition, I have reviewed and discussed with patient certain preventive protocols, quality metrics, and best practice recommendations. A written personalized care plan for preventive services as well as general preventive health recommendations were provided to patient.   Murrell Elizondo, CMA   03/18/2024   No follow-ups on file.  After Visit Summary: (MyChart) Due to this being a telephonic visit, the after visit summary with patients personalized plan was offered to patient via MyChart   Nurse Notes: Advanced Directives packet mailed to patient

## 2024-03-18 NOTE — Patient Instructions (Signed)
 Teresa Arellano,  Thank you for taking the time for your Medicare Wellness Visit. I appreciate your continued commitment to your health goals. Please review the care plan we discussed, and feel free to reach out if I can assist you further.  Please note that Annual Wellness Visits do not include a physical exam. Some assessments may be limited, especially if the visit was conducted virtually. If needed, we may recommend an in-person follow-up with your provider.  Ongoing Care Seeing your primary care provider every 3 to 6 months helps us  monitor your health and provide consistent, personalized care.   In office visit with Dr. Antonetta: Mar 22, 2024 at 4pm  1 year follow up with medicare wellness nurse: March 19, 2025 at 11:20 am in office  Referrals If a referral was made during today's visit and you haven't received any updates within two weeks, please contact the referred provider directly to check on the status.  none Recommended Screenings:  Health Maintenance  Topic Date Due   DTaP/Tdap/Td vaccine (2 - Td or Tdap) 09/11/2023   Flu Shot  11/10/2023   COVID-19 Vaccine (5 - 2025-26 season) 12/11/2023   Medicare Annual Wellness Visit  03/14/2024   Breast Cancer Screening  06/11/2025   Colon Cancer Screening  09/22/2032   Pneumococcal Vaccine for age over 95  Completed   Osteoporosis screening with Bone Density Scan  Completed   Hepatitis C Screening  Completed   Zoster (Shingles) Vaccine  Completed   Meningitis B Vaccine  Aged Out       03/18/2024   10:53 AM  Advanced Directives  Does Patient Have a Medical Advance Directive? No  Would patient like information on creating a medical advance directive? Yes (MAU/Ambulatory/Procedural Areas - Information given)    Vision: Annual vision screenings are recommended for early detection of glaucoma, cataracts, and diabetic retinopathy. These exams can also reveal signs of chronic conditions such as diabetes and high blood  pressure.  Dental: Annual dental screenings help detect early signs of oral cancer, gum disease, and other conditions linked to overall health, including heart disease and diabetes.  Please see the attached documents for additional preventive care recommendations.

## 2024-03-22 ENCOUNTER — Ambulatory Visit: Admitting: Family Medicine

## 2024-03-22 VITALS — BP 123/72 | HR 86 | Resp 16 | Ht 66.0 in | Wt 186.0 lb

## 2024-03-22 DIAGNOSIS — Z0001 Encounter for general adult medical examination with abnormal findings: Secondary | ICD-10-CM

## 2024-03-22 DIAGNOSIS — Z23 Encounter for immunization: Secondary | ICD-10-CM | POA: Insufficient documentation

## 2024-03-22 DIAGNOSIS — I10 Essential (primary) hypertension: Secondary | ICD-10-CM | POA: Diagnosis not present

## 2024-03-22 NOTE — Patient Instructions (Signed)
 F/U in 6 months  Work on 6 pound weight loss   Need TdaP and Covid vaccines at the pharmacy  Lab today  It is important that you exercise regularly at least 30 minutes 5 times a week. If you develop chest pain, have severe difficulty breathing, or feel very tired, stop exercising immediately and seek medical attention   Think about what you will eat, plan ahead. Choose  clean, Klas, fresh or frozen over canned, processed or packaged foods which are more sugary, salty and fatty. 70 to 75% of food eaten should be vegetables and fruit. Three meals at set times with snacks allowed between meals, but they must be fruit or vegetables. Aim to eat over a 12 hour period , example 7 am to 7 pm, and STOP after  your last meal of the day. Drink water ,generally about 64 ounces per day, no other drink is as healthy. Fruit juice is best enjoyed in a healthy way, by EATING the fruit.  Thanks for choosing [facility], we consider it a privelige to serve you.\

## 2024-03-22 NOTE — Progress Notes (Signed)
° ° °  Teresa Arellano     MRN: [id number]      DOB: 30-Apr-1950  Chief Complaint  Patient presents with   Annual Exam    Cpe     HPI: Patient is in for annual physical exam. No other health concerns are expressed or addressed at the visit. Recent labs,  are reviewed. Immunization is reviewed , and  updated if needed.   PE: BP 123/72   Pulse 86   Resp 16   Ht 5' 6 (1.676 m)   Wt 186 lb (84.4 kg)   SpO2 96%   BMI 30.02 kg/m   Pleasant  female, alert and oriented x 3, in no cardio-pulmonary distress. Afebrile. HEENT No facial trauma or asymetry. Sinuses non tender.  Extra occullar muscles intact.. External ears normal, . Neck: supple, no adenopathy,JVD or thyromegaly.No bruits.  Chest: Clear to ascultation bilaterally.No crackles or wheezes. Non tender to palpation  Breast: Asymptomatic, not examined  Cardiovascular system; Heart sounds normal,  S1 and  S2 ,no S3.  No murmur, or thrill. Apical beat not displaced Peripheral pulses normal.  Abdomen: Soft, non tender, no organomegaly or masses. No bruits. Bowel sounds normal. No guarding, tenderness or rebound.    Musculoskeletal exam: Full ROM of spine, hips , shoulders and knees. No deformity ,swelling or crepitus noted. No muscle wasting or atrophy.   Neurologic: Cranial nerves 2 to 12 intact. Power, tone ,sensation and reflexes normal throughout. No disturbance in gait. No tremor.  Skin: Intact, no ulceration, erythema , scaling or rash noted. Pigmentation normal throughout  Psych; Normal mood and affect. Judgement and concentration normal   Assessment & Plan:  Immunization due After obtaining informed consent, the influenza vaccine is  administered , with no adverse effect noted at the time of administration.   Essential hypertension, benign Controlled, no change in medication DASH diet and commitment to daily physical activity for a minimum of 30 minutes discussed and encouraged, as a part  of hypertension management. The importance of attaining a healthy weight is also discussed.     03/22/2024    4:07 PM 03/18/2024   10:51 AM 12/29/2023    5:54 AM 12/29/2023    5:53 AM 06/30/2023    4:00 PM 06/05/2023    8:20 AM 03/15/2023   11:17 AM  BP/Weight  Systolic BP 123 -- 128  119 116 --  Diastolic BP 72 -- 85  75 76 --  Wt. (Lbs) 186 180  183 195 189.08 193  BMI 30.02 kg/m2 29.05 kg/m2  29.54 kg/m2 31.47 kg/m2 30.52 kg/m2 31.15 kg/m2       Annual visit for general adult medical examination with abnormal findings Annual exam as documented. Counseling done  re healthy lifestyle involving commitment to 150 minutes exercise per week, heart healthy diet, and attaining healthy weight.The importance of adequate sleep also discussed. Regular seat belt use and home safety, is also discussed. Changes in health habits are decided on by the patient with goals and time frames  set for achieving them. Immunization and cancer screening needs are specifically addressed at this visit.

## 2024-03-22 NOTE — Assessment & Plan Note (Signed)
 Controlled, no change in medication DASH diet and commitment to daily physical activity for a minimum of 30 minutes discussed and encouraged, as a part of hypertension management. The importance of attaining a healthy weight is also discussed.     03/22/2024    4:07 PM 03/18/2024   10:51 AM 12/29/2023    5:54 AM 12/29/2023    5:53 AM 06/30/2023    4:00 PM 06/05/2023    8:20 AM 03/15/2023   11:17 AM  BP/Weight  Systolic BP 123 -- 128  119 116 --  Diastolic BP 72 -- 85  75 76 --  Wt. (Lbs) 186 180  183 195 189.08 193  BMI 30.02 kg/m2 29.05 kg/m2  29.54 kg/m2 31.47 kg/m2 30.52 kg/m2 31.15 kg/m2

## 2024-03-22 NOTE — Assessment & Plan Note (Signed)
 After obtaining informed consent, the influenza vaccine is  administered , with no adverse effect noted at the time of administration.

## 2024-03-22 NOTE — Assessment & Plan Note (Signed)

## 2024-03-23 ENCOUNTER — Ambulatory Visit: Payer: Self-pay | Admitting: Family Medicine

## 2024-03-23 LAB — BASIC METABOLIC PANEL WITH GFR
BUN/Creatinine Ratio: 17 (ref 12–28)
BUN: 15 mg/dL (ref 8–27)
CO2: 24 mmol/L (ref 20–29)
Calcium: 9.6 mg/dL (ref 8.7–10.3)
Chloride: 106 mmol/L (ref 96–106)
Creatinine, Ser: 0.88 mg/dL (ref 0.57–1.00)
Glucose: 87 mg/dL (ref 70–99)
Potassium: 3.9 mmol/L (ref 3.5–5.2)
Sodium: 143 mmol/L (ref 134–144)
eGFR: 69 mL/min/1.73 (ref >59)

## 2024-03-25 ENCOUNTER — Other Ambulatory Visit (HOSPITAL_COMMUNITY): Payer: Self-pay | Admitting: Family Medicine

## 2024-03-25 DIAGNOSIS — Z1231 Encounter for screening mammogram for malignant neoplasm of breast: Secondary | ICD-10-CM

## 2024-03-28 ENCOUNTER — Other Ambulatory Visit: Payer: Self-pay | Admitting: Family Medicine

## 2024-05-09 ENCOUNTER — Other Ambulatory Visit: Payer: Self-pay | Admitting: Family Medicine

## 2024-05-15 ENCOUNTER — Other Ambulatory Visit: Payer: Self-pay | Admitting: Family Medicine

## 2024-06-12 ENCOUNTER — Ambulatory Visit (HOSPITAL_COMMUNITY)

## 2024-09-20 ENCOUNTER — Ambulatory Visit: Payer: Self-pay | Admitting: Family Medicine

## 2025-03-19 ENCOUNTER — Ambulatory Visit
